# Patient Record
Sex: Female | Born: 1965 | Race: Black or African American | Hispanic: No
Health system: Midwestern US, Community
[De-identification: ages and names within clinical notes are randomized; demographics above are authoritative.]

## PROBLEM LIST (undated history)

## (undated) ENCOUNTER — Emergency Department (HOSPITAL_COMMUNITY): Admission: EM | Payer: Medicaid Other

## (undated) DIAGNOSIS — R9431 Abnormal electrocardiogram [ECG] [EKG]: Secondary | ICD-10-CM

## (undated) DIAGNOSIS — M25569 Pain in unspecified knee: Secondary | ICD-10-CM

## (undated) DIAGNOSIS — Z1231 Encounter for screening mammogram for malignant neoplasm of breast: Secondary | ICD-10-CM

## (undated) DIAGNOSIS — E781 Pure hyperglyceridemia: Secondary | ICD-10-CM

## (undated) DIAGNOSIS — R109 Unspecified abdominal pain: Secondary | ICD-10-CM

## (undated) DIAGNOSIS — M1990 Unspecified osteoarthritis, unspecified site: Secondary | ICD-10-CM

## (undated) DIAGNOSIS — K2100 Gastro-esophageal reflux disease with esophagitis, without bleeding: Secondary | ICD-10-CM

## (undated) DIAGNOSIS — E7800 Pure hypercholesterolemia, unspecified: Secondary | ICD-10-CM

## (undated) DIAGNOSIS — J40 Bronchitis, not specified as acute or chronic: Secondary | ICD-10-CM

## (undated) DIAGNOSIS — I441 Atrioventricular block, second degree: Secondary | ICD-10-CM

## (undated) DIAGNOSIS — K219 Gastro-esophageal reflux disease without esophagitis: Secondary | ICD-10-CM

## (undated) DIAGNOSIS — T7840XA Allergy, unspecified, initial encounter: Secondary | ICD-10-CM

## (undated) DIAGNOSIS — I2510 Atherosclerotic heart disease of native coronary artery without angina pectoris: Secondary | ICD-10-CM

## (undated) DIAGNOSIS — G43909 Migraine, unspecified, not intractable, without status migrainosus: Secondary | ICD-10-CM

## (undated) DIAGNOSIS — J42 Unspecified chronic bronchitis: Secondary | ICD-10-CM

## (undated) DIAGNOSIS — S43006A Unspecified dislocation of unspecified shoulder joint, initial encounter: Secondary | ICD-10-CM

## (undated) DIAGNOSIS — J45909 Unspecified asthma, uncomplicated: Secondary | ICD-10-CM

## (undated) HISTORY — PX: KNEE CARTILAGE SURGERY: SHX688

## (undated) HISTORY — DX: Allergy, unspecified, initial encounter: T78.40XA

## (undated) HISTORY — DX: Gastro-esophageal reflux disease without esophagitis: K21.9

## (undated) HISTORY — PX: ENDOMETRIAL ABLATION W/ NOVASURE: SUR434

## (undated) HISTORY — DX: Migraine, unspecified, not intractable, without status migrainosus: G43.909

## (undated) HISTORY — PX: UNILATERAL SALPINGECTOMY: SHX6160

## (undated) HISTORY — DX: Atrioventricular block, second degree: I44.1

---

## 1992-11-20 DIAGNOSIS — G43909 Migraine, unspecified, not intractable, without status migrainosus: Secondary | ICD-10-CM

## 1992-11-20 HISTORY — DX: Migraine, unspecified, not intractable, without status migrainosus: G43.909

## 1994-11-20 DIAGNOSIS — J45909 Unspecified asthma, uncomplicated: Secondary | ICD-10-CM

## 1994-11-20 HISTORY — DX: Unspecified asthma, uncomplicated: J45.909

## 1995-11-21 DIAGNOSIS — S43006A Unspecified dislocation of unspecified shoulder joint, initial encounter: Secondary | ICD-10-CM

## 1995-11-21 HISTORY — DX: Unspecified dislocation of unspecified shoulder joint, initial encounter: S43.006A

## 2006-11-20 HISTORY — PX: ENDOMETRIAL ABLATION: SHX621

## 2008-10-26 LAB — D-DIMER, QUANTITATIVE: D-Dimer, Quant: 0.28 ug/ml(FEU) (ref <0.46)

## 2008-10-26 LAB — D DIMER: D DIMER: 0.28 ug/ml(FEU) (ref <0.46)

## 2009-10-30 LAB — CBC WITH AUTOMATED DIFF
ABS. BASOPHILS: 0.1 10*3/uL (ref 0.0–0.1)
ABS. LYMPHOCYTES: 3.4 10*3/uL (ref 0.8–3.5)
ABS. MONOCYTES: 0.5 10*3/uL (ref 0–1.0)
ABS. NEUTROPHILS: 5 10*3/uL (ref 1.8–8.0)
BASOPHILS: 1 % (ref 0–3)
EOSINOPHILS: 0 % (ref 0–5)
HCT: 40.9 % (ref 36.0–46.0)
HGB: 13.9 g/dL (ref 12.0–16.0)
LYMPHOCYTES: 38 % (ref 20–51)
MCH: 31.8 PG (ref 25.0–35.0)
MCHC: 34 g/dL (ref 31.0–37.0)
MCV: 93.8 FL (ref 78.0–102.0)
MONOCYTES: 5 % (ref 2–9)
MPV: 9 FL (ref 7.4–10.4)
NEUTROPHILS: 56 % (ref 42–75)
PLATELET: 313 10*3/uL (ref 130–400)
RBC: 4.37 M/uL (ref 4.10–5.10)
RDW: 13 % (ref 11.5–14.5)
WBC: 9 10*3/uL (ref 4.5–13.0)

## 2009-10-30 LAB — METABOLIC PANEL, BASIC
Anion gap: 9 mmol/L (ref 5–15)
BUN/Creatinine ratio: 16 (ref 12–20)
BUN: 14 MG/DL (ref 7–18)
CO2: 27 MMOL/L (ref 21–32)
Calcium: 9.5 MG/DL (ref 8.4–10.4)
Chloride: 103 MMOL/L (ref 100–108)
Creatinine: 0.9 MG/DL (ref 0.6–1.3)
GFR est AA: >60 mL/min/{1.73_m2} (ref >60)
GFR est non-AA: >60 mL/min/{1.73_m2} (ref >60)
Glucose: 87 MG/DL (ref 74–99)
Potassium: 4.3 MMOL/L (ref 3.5–5.5)
Sodium: 139 MMOL/L (ref 130–146)

## 2009-10-30 LAB — MAGNESIUM: Magnesium: 1.8 MG/DL (ref 1.8–2.4)

## 2009-10-31 LAB — CKMB PROFILE
CK - MB: 0.7 ng/ml (ref 0.5–3.6)
CK-MB Index: 0.4 % (ref 0.0–4.0)
CK: 196 U/L (ref 21–215)

## 2009-10-31 LAB — TSH 3RD GENERATION: TSH: 1.78 u[IU]/mL (ref 0.51–6.27)

## 2009-10-31 LAB — TROPONIN I: Troponin-I, QT: <0.04 NG/ML (ref 0.00–0.08)

## 2009-11-05 LAB — D-DIMER, QUANTITATIVE: D-Dimer, Quant: <0.22 ug/ml(FEU) (ref <0.46)

## 2009-11-05 LAB — CBC WITH AUTOMATED DIFF
ABS. BASOPHILS: 0.1 10*3/uL (ref 0.0–0.1)
ABS. EOSINOPHILS: 0 10*3/uL (ref 0.0–0.4)
ABS. LYMPHOCYTES: 3.6 10*3/uL — ABNORMAL HIGH (ref 0.8–3.5)
ABS. MONOCYTES: 0.6 10*3/uL (ref 0–1.0)
ABS. NEUTROPHILS: 5.3 10*3/uL (ref 1.8–8.0)
BASOPHILS: 1 % (ref 0–3)
EOSINOPHILS: 0 % (ref 0–5)
HCT: 40.6 % (ref 36.0–46.0)
HGB: 13.7 g/dL (ref 12.0–16.0)
LYMPHOCYTES: 38 % (ref 20–51)
MCH: 31.1 PG (ref 25.0–35.0)
MCHC: 33.8 g/dL (ref 31.0–37.0)
MCV: 92 FL (ref 78.0–102.0)
MONOCYTES: 6 % (ref 2–9)
MPV: 8.4 FL (ref 7.4–10.4)
NEUTROPHILS: 55 % (ref 42–75)
PLATELET: 338 10*3/uL (ref 130–400)
RBC: 4.41 M/uL (ref 4.10–5.10)
RDW: 13.9 % (ref 11.5–14.5)
WBC: 9.6 10*3/uL (ref 4.5–13.0)

## 2009-11-05 LAB — METABOLIC PANEL, COMPREHENSIVE
A-G Ratio: 1.1 (ref 0.8–1.7)
ALT (SGPT): 30 U/L (ref 30–65)
AST (SGOT): 19 U/L (ref 15–37)
Albumin: 3.9 g/dL (ref 3.4–5.0)
Alk. phosphatase: 71 U/L (ref 50–136)
Anion gap: 9 mmol/L (ref 5–15)
BUN/Creatinine ratio: 14 (ref 12–20)
BUN: 11 MG/DL (ref 7–18)
Bilirubin, total: 0.4 MG/DL (ref 0.1–0.9)
CO2: 26 MMOL/L (ref 21–32)
Calcium: 9.5 MG/DL (ref 8.4–10.4)
Chloride: 105 MMOL/L (ref 100–108)
Creatinine: 0.8 MG/DL (ref 0.6–1.3)
GFR est AA: >60 mL/min/{1.73_m2} (ref >60)
GFR est non-AA: >60 mL/min/{1.73_m2} (ref >60)
Globulin: 3.7 g/dL (ref 2.0–4.0)
Glucose: 84 MG/DL (ref 74–99)
Potassium: 3.7 MMOL/L (ref 3.5–5.5)
Protein, total: 7.6 g/dL (ref 6.4–8.2)
Sodium: 140 MMOL/L (ref 136–145)

## 2009-11-05 LAB — D DIMER: D DIMER: <0.22 ug/ml(FEU) (ref <0.46)

## 2009-11-05 LAB — TROPONIN I: Troponin-I, QT: 0.04 NG/ML (ref 0.00–0.08)

## 2009-11-05 LAB — CKMB PROFILE
CK - MB: 0.6 ng/ml (ref 0.5–3.6)
CK-MB Index: 0.3 % (ref 0.0–4.0)
CK: 187 U/L (ref 21–215)

## 2009-11-06 LAB — CKMB PROFILE
CK - MB: 0.6 ng/ml (ref 0.5–3.6)
CK-MB Index: 0.4 % (ref 0.0–4.0)
CK: 171 U/L (ref 21–215)

## 2009-11-06 LAB — HCG URINE, QL: HCG urine, QL: NEGATIVE

## 2009-11-06 LAB — TROPONIN I: Troponin-I, QT: <0.04 NG/ML (ref 0.00–0.08)

## 2010-01-17 LAB — D-DIMER, QUANTITATIVE: D-Dimer, Quant: 0.22 ug/ml(FEU) (ref <0.46)

## 2010-01-17 LAB — D DIMER: D DIMER: 0.22 ug/ml(FEU) (ref <0.46)

## 2010-01-17 LAB — TROPONIN I: Troponin-I, QT: <0.04 NG/ML (ref 0.00–0.08)

## 2011-03-09 LAB — D-DIMER, QUANTITATIVE: D-Dimer, Quant: <0.22 ug/ml(FEU) (ref <0.46)

## 2011-03-09 LAB — URINALYSIS W/ RFLX MICROSCOPIC
Bilirubin: NEGATIVE
Blood: NEGATIVE
Glucose: NEGATIVE MG/DL
Ketone: NEGATIVE MG/DL
Leukocyte Esterase: NEGATIVE
Nitrites: NEGATIVE
Protein: NEGATIVE MG/DL
Specific gravity: 1.02 (ref 1.003–1.030)
Urobilinogen: 1 EU/DL (ref 0.2–1.0)
pH (UA): 7.5 (ref 5.0–8.0)

## 2011-03-09 LAB — METABOLIC PANEL, COMPREHENSIVE
A-G Ratio: 0.8 (ref 0.8–1.7)
ALT (SGPT): 28 U/L — ABNORMAL LOW (ref 30–65)
AST (SGOT): 14 U/L — ABNORMAL LOW (ref 15–37)
Albumin: 3.5 g/dL (ref 3.4–5.0)
Alk. phosphatase: 111 U/L (ref 50–136)
Anion gap: 7 mmol/L (ref 5–15)
BUN/Creatinine ratio: 16 (ref 12–20)
BUN: 13 MG/DL (ref 7–18)
Bilirubin, total: 0.2 MG/DL (ref 0.2–1.0)
CO2: 27 MMOL/L (ref 21–32)
Calcium: 8.8 MG/DL (ref 8.4–10.4)
Chloride: 105 MMOL/L (ref 100–108)
Creatinine: 0.8 MG/DL (ref 0.6–1.3)
GFR est AA: >60 mL/min/{1.73_m2} (ref >60)
GFR est non-AA: >60 mL/min/{1.73_m2} (ref >60)
Globulin: 4.2 g/dL — ABNORMAL HIGH (ref 2.0–4.0)
Glucose: 100 MG/DL — ABNORMAL HIGH (ref 74–99)
Potassium: 3.9 MMOL/L (ref 3.5–5.5)
Protein, total: 7.7 g/dL (ref 6.4–8.2)
Sodium: 139 MMOL/L (ref 136–145)

## 2011-03-09 LAB — CBC WITH AUTOMATED DIFF
ABS. BASOPHILS: 0 10*3/uL (ref 0.0–0.06)
ABS. EOSINOPHILS: 0 10*3/uL (ref 0.0–0.4)
ABS. LYMPHOCYTES: 3.1 10*3/uL (ref 0.9–3.6)
ABS. MONOCYTES: 0.6 10*3/uL (ref 0.05–1.2)
ABS. NEUTROPHILS: 4 10*3/uL (ref 1.8–8.0)
BASOPHILS: 0 % (ref 0–2)
EOSINOPHILS: 0 % (ref 0–5)
HCT: 41.1 % (ref 35.0–45.0)
HGB: 13.3 g/dL (ref 12.0–16.0)
LYMPHOCYTES: 40 % (ref 21–52)
MCH: 29.4 PG (ref 24.0–34.0)
MCHC: 32.4 g/dL (ref 31.0–37.0)
MCV: 90.7 FL (ref 74.0–97.0)
MONOCYTES: 7 % (ref 3–10)
MPV: 10.2 FL (ref 9.2–11.8)
NEUTROPHILS: 53 % (ref 40–73)
PLATELET: 325 10*3/uL (ref 135–420)
RBC: 4.53 M/uL (ref 4.20–5.30)
RDW: 13.7 % (ref 11.6–14.5)
WBC: 7.7 10*3/uL (ref 4.6–13.2)

## 2011-03-09 LAB — TROPONIN I: Troponin-I, QT: 0.05 NG/ML (ref 0.00–0.08)

## 2011-03-09 LAB — D DIMER: D DIMER: <0.22 ug/ml(FEU) (ref <0.46)

## 2011-03-09 LAB — CKMB PROFILE
CK - MB: 1.4 ng/ml (ref 0.5–3.6)
CK-MB Index: 0.7 % (ref 0.0–4.0)
CK: 190 U/L (ref 26–192)

## 2011-03-09 LAB — LIPASE: Lipase: 254 U/L (ref 73–393)

## 2011-03-09 LAB — HCG URINE, QL: HCG urine, QL: NEGATIVE

## 2011-03-10 LAB — CULTURE, URINE
Culture result:: <10000
Culture: <10000

## 2011-07-21 LAB — CBC WITH AUTOMATED DIFF
ABS. BASOPHILS: 0 10*3/uL (ref 0.0–0.06)
ABS. EOSINOPHILS: 0 10*3/uL (ref 0.0–0.4)
ABS. LYMPHOCYTES: 3.5 10*3/uL (ref 0.9–3.6)
ABS. MONOCYTES: 0.5 10*3/uL (ref 0.05–1.2)
ABS. NEUTROPHILS: 5.3 10*3/uL (ref 1.8–8.0)
BASOPHILS: 0 % (ref 0–2)
EOSINOPHILS: 0 % (ref 0–5)
HCT: 38.1 % (ref 35.0–45.0)
HGB: 12.9 g/dL (ref 12.0–16.0)
LYMPHOCYTES: 37 % (ref 21–52)
MCH: 29.9 PG (ref 24.0–34.0)
MCHC: 33.9 g/dL (ref 31.0–37.0)
MCV: 88.4 FL (ref 74.0–97.0)
MONOCYTES: 5 % (ref 3–10)
MPV: 9.6 FL (ref 9.2–11.8)
NEUTROPHILS: 58 % (ref 40–73)
PLATELET: 310 10*3/uL (ref 135–420)
RBC: 4.31 M/uL (ref 4.20–5.30)
RDW: 13.9 % (ref 11.6–14.5)
WBC: 9.3 10*3/uL (ref 4.6–13.2)

## 2011-07-21 LAB — METABOLIC PANEL, BASIC
Anion gap: 5 mmol/L (ref 5–15)
BUN/Creatinine ratio: 12 (ref 12–20)
BUN: 12 MG/DL (ref 7–18)
CO2: 26 MMOL/L (ref 21–32)
Calcium: 8.9 MG/DL (ref 8.4–10.4)
Chloride: 109 MMOL/L — ABNORMAL HIGH (ref 100–108)
Creatinine: 1 MG/DL (ref 0.6–1.3)
GFR est AA: >60 mL/min/{1.73_m2} (ref >60)
GFR est non-AA: >60 mL/min/{1.73_m2} (ref >60)
Glucose: 92 MG/DL (ref 74–99)
Potassium: 3.9 MMOL/L (ref 3.5–5.5)
Sodium: 140 MMOL/L (ref 136–145)

## 2011-07-21 LAB — PROTHROMBIN TIME + INR
INR: 0.9 (ref 0.0–1.2)
Prothrombin time: 12.2 s (ref 11.5–15.2)

## 2011-07-21 LAB — PTT: aPTT: 30.3 s (ref 24.6–37.7)

## 2011-07-21 MED ORDER — PROMETHAZINE 25 MG/ML INJECTION
25 mg/mL | INTRAMUSCULAR | Status: AC
Start: 2011-07-21 — End: 2011-07-21
  Administered 2011-07-21: 16:00:00 via INTRAVENOUS

## 2011-07-21 MED ORDER — PROMETHAZINE 12.5 MG TAB
12.5 mg | ORAL_TABLET | Freq: Four times a day (QID) | ORAL | Status: DC | PRN
Start: 2011-07-21 — End: 2011-12-18

## 2011-07-21 MED ORDER — NAPROXEN 500 MG TAB
500 mg | ORAL_TABLET | Freq: Two times a day (BID) | ORAL | Status: AC
Start: 2011-07-21 — End: 2011-07-31

## 2011-07-21 MED ORDER — ACETAMINOPHEN 325 MG TABLET
325 mg | ORAL | Status: AC
Start: 2011-07-21 — End: 2011-07-21
  Administered 2011-07-21: 16:00:00 via ORAL

## 2011-07-21 MED ADMIN — sodium chloride 0.9 % bolus infusion 1,000 mL: INTRAVENOUS | @ 16:00:00 | NDC 00409798309

## 2011-07-21 MED ADMIN — ketorolac (TORADOL) injection 30 mg: INTRAVENOUS | @ 17:00:00 | NDC 00409379501

## 2011-07-21 NOTE — ED Notes (Signed)
Pt, states  She had  Left sided headache, dizziness started  About  2 hours, ago, denies  Nausea/vomiting.

## 2011-07-21 NOTE — ED Provider Notes (Signed)
HPI Comments: 45 yo AAF w/ h/o Migraine HA's presents to ED c/o sharp left sided frontal HA that radiates to right forhead onset 1.5hrs PTA while standing around at work w/ associated dizziness.  She admits to working outside but says "it's not that hot".   She describes her dizziness as spinning that's better at rest.  She admits to taking Tylenol 250mg  PTA w/ no relief of Sx's.  Pt denies any focal weakness or N/V but admits to recent seasonal allergies and sinus congestion.  PCP is Dr. Kenna Gilbert.  No other aggravating or alleviating factors. No further complaints at this time.   11:23 AM        Patient is a 44 y.o. female presenting with migraine and dizziness. The history is provided by the patient.   Migraine   Associated symptoms include dizziness. Pertinent negatives include no fever, no shortness of breath, no weakness, no nausea and no vomiting.   Dizziness   Associated symptoms include congestion (sinus'), headaches and dizziness. Pertinent negatives include no chest pain, no fever, no abdominal pain, no nausea, no vomiting and no weakness.        Past Medical History   Diagnosis Date   ??? Migraine         Past Surgical History   Procedure Date   ??? Hx gyn    ??? Hx tubal ligation          No family history on file.     History     Social History   ??? Marital Status: Single     Spouse Name: N/A     Number of Children: N/A   ??? Years of Education: N/A     Occupational History   ??? Not on file.     Social History Main Topics   ??? Smoking status: Never Smoker    ??? Smokeless tobacco: Not on file   ??? Alcohol Use: Yes      rarely   ??? Drug Use: No   ??? Sexually Active:      Other Topics Concern   ??? Not on file     Social History Narrative   ??? No narrative on file                  ALLERGIES: Review of patient's allergies indicates no known allergies.      Review of Systems   Constitutional: Negative.  Negative for fever and chills.   HENT: Positive for congestion (sinus') and ear discharge (chronic).     Eyes: Negative.    Respiratory: Negative.  Negative for shortness of breath.    Cardiovascular: Negative.  Negative for chest pain.   Gastrointestinal: Negative.  Negative for nausea, vomiting and abdominal pain.   Genitourinary: Negative.    Musculoskeletal: Negative.    Skin: Negative.    Neurological: Positive for dizziness and headaches. Negative for weakness.   Psychiatric/Behavioral: Negative.    All other systems reviewed and are negative.        Filed Vitals:    07/21/11 1052 07/21/11 1105   BP: 134/85    Pulse: 78    Temp: 97.6 ??F (36.4 ??C)    Resp: 14    Height: 5\' 2"  (1.575 m)    Weight: 87.091 kg (192 lb)    SpO2: 98% 100%            Physical Exam   Nursing note and vitals reviewed.  Constitutional: She is oriented to person, place, and time.  She appears well-developed and well-nourished. No distress.   HENT:   Head: Normocephalic and atraumatic.   Right Ear: External ear normal.   Nose: Nose normal.   Mouth/Throat: Oropharynx is clear and moist.        Mild pain to percussion of the L frontal scalp, L TM poorly visualized due to cerumen     Eyes: Conjunctivae and EOM are normal. Pupils are equal, round, and reactive to light. No scleral icterus.   Neck: Normal range of motion. Neck supple. No JVD present. No tracheal deviation present. No thyromegaly present.   Cardiovascular: Normal rate, regular rhythm, normal heart sounds and intact distal pulses.  Exam reveals no gallop and no friction rub.    No murmur heard.  Pulmonary/Chest: Effort normal and breath sounds normal. She exhibits no tenderness.   Abdominal: Soft. Bowel sounds are normal. She exhibits no distension. There is no tenderness. There is no rebound and no guarding.   Musculoskeletal: Normal range of motion. She exhibits no edema and no tenderness.   Lymphadenopathy:     She has no cervical adenopathy.    Neurological: She is alert and oriented to person, place, and time. She has normal reflexes. No cranial nerve deficit. Coordination normal.        No sensory loss, Gait normal, Motor 5/5, no arm or leg drift   Skin: Skin is warm and dry.   Psychiatric: She has a normal mood and affect. Her behavior is normal. Judgment and thought content normal.        MDM     Differential Diagnosis; Clinical Impression; Plan:     Pt is a 45yo female with a h/o migraine HA presents with complaint of L sided HA that occurred at work.  Pt has mild disequilibrium, sinus pressure.  Pt denies weakness and the onset was 1.5 hours PTA.  Pt is nonfocal at this time.  Will CT head as SAH remains in the differential but sinus disease vs migraine HA likely.  Will treat with phenergan, IVF, and reevaluate.       Procedures  Patients labs and imaging reviewed and reassuring.  Pt pain has improved.  Likelihood of SAH is low given her presentation within 6 hrs of onset.  Will proceed with close out patient supportive care.  Pt educated to return if at all worsened or concerned.

## 2011-07-21 NOTE — ED Notes (Signed)
Patient c/o sharp headache pains to left side of head that radiate across frontal part of head.  Patient reports she suffers from migraines but states "this don't feel like my normal headache" and reports this feels like the worst headache she has ever felt.  Patient also reports dizziness with headache.

## 2011-07-21 NOTE — ED Notes (Signed)
Patient complains of having a headache with some dizziness. She admits to suffering from migraines, but this is not a migraine. She states she has had sinus problems for the past two days. Denies any weakness in her extremities.

## 2011-07-21 NOTE — ED Notes (Signed)
Patient armband removed and shredded. Patient given discharge instructions, verbally acknowledged understanding of all discharge instructions. Patient ambulated out of ED with no signs of distress noted.

## 2011-08-23 NOTE — ED Notes (Signed)
Pt did not respond to x3 attempt calls

## 2011-10-30 ENCOUNTER — Encounter

## 2011-12-18 MED ORDER — HYDROCODONE-ACETAMINOPHEN 5 MG-325 MG TAB
5-325 mg | ORAL_TABLET | ORAL | Status: DC | PRN
Start: 2011-12-18 — End: 2012-02-14

## 2011-12-18 MED ORDER — IBUPROFEN 600 MG TAB
600 mg | ORAL_TABLET | Freq: Four times a day (QID) | ORAL | Status: DC | PRN
Start: 2011-12-18 — End: 2012-05-31

## 2011-12-18 MED ORDER — KETOROLAC TROMETHAMINE 60 MG/2 ML IM
60 mg/2 mL | INTRAMUSCULAR | Status: AC
Start: 2011-12-18 — End: 2011-12-18
  Administered 2011-12-18: 12:00:00 via INTRAMUSCULAR

## 2011-12-18 MED ORDER — CYCLOBENZAPRINE 10 MG TAB
10 mg | ORAL_TABLET | Freq: Three times a day (TID) | ORAL | Status: DC | PRN
Start: 2011-12-18 — End: 2012-02-14

## 2011-12-18 NOTE — ED Provider Notes (Signed)
Patient is a 46 y.o. female presenting with back pain.   Back Pain        46 yo female to ER with c/o Intermittant lower back pain x 3 days, pt also c/o migraine HA x 2 days, pt denies any fever, N/V/D, CP, SOB, dizziness, blurry vision, ABD pain, lightheadedness, weakness, paresthesias, recent injury/surgery/trauma, urinary s/s, vaginal complaints or any other concerns, pt has taken no meds PTA, pt drove self to ER, pt reports that her pain is exacerbated with movement, improved with rest. Pt reports hx LBP and migraine HA, pt reports that her HA is same as typical migraine, Not worst of life, Not sudden onset, not quick to maximal intensity, PCP Dr Kenna Gilbert.     Past Medical History   Diagnosis Date   ??? Migraine    ??? GERD (gastroesophageal reflux disease)         Past Surgical History   Procedure Date   ??? Hx gyn    ??? Hx tubal ligation          No family history on file.     History     Social History   ??? Marital Status: SINGLE     Spouse Name: N/A     Number of Children: N/A   ??? Years of Education: N/A     Occupational History   ??? Not on file.     Social History Main Topics   ??? Smoking status: Never Smoker    ??? Smokeless tobacco: Not on file   ??? Alcohol Use: Yes      rarely   ??? Drug Use: No   ??? Sexually Active:      Other Topics Concern   ??? Not on file     Social History Narrative   ??? No narrative on file                  ALLERGIES: Review of patient's allergies indicates no known allergies.      Review of Systems   Musculoskeletal: Positive for back pain.     ROS   Constitutional:?? Denies malaise, fever, chills.   Head:?? Denies injury.   Face:?? Denies injury or pain.   ENMT:?? Denies sore throat.   Neck:?? Denies injury or pain.   Chest:?? Denies injury.   Cardiac:?? Denies chest pain or palpitations.   Respiratory:?? Denies cough, wheezing, difficulty breathing, shortness of breath.   GI/ABD:?? Denies injury, pain, distention, nausea, vomiting, diarrhea.   GU:?? Denies injury, pain, dysuria or urgency.   Back:?? see  HPI  Pelvis:?? Denies injury or pain.   Extremity/MS:?? Denies injury or pain.   Neuro:?? see HPI  Skin: Denies injury, rash, itching or skin changes.       Filed Vitals:    12/18/11 0604   BP: 125/80   Pulse: 69   Temp: 97.2 ??F (36.2 ??C)   Resp: 18   Height: 5\' 2"  (1.575 m)   Weight: 90.719 kg (200 lb)   SpO2: 96%            Physical Exam   Nursing note and vitals reviewed.  Constitutional: She is oriented to person, place, and time. She appears well-developed and well-nourished.   HENT:   Head: Normocephalic and atraumatic.   Right Ear: External ear normal.   Left Ear: External ear normal.   Nose: Nose normal.   Mouth/Throat: Oropharynx is clear and moist.   Eyes: Conjunctivae are normal. Pupils are equal, round, and reactive  to light.   Neck: Normal range of motion. Neck supple.        FROM in neck with no midline spinal or paraspinal muscle tenderness, no swelling, bruising, ecchymosis, deformity or step off noted. nv intact   Cardiovascular: Normal rate, regular rhythm and normal heart sounds.    Pulmonary/Chest: Effort normal and breath sounds normal.   Abdominal: Soft. Bowel sounds are normal. There is no tenderness. There is no rebound and no guarding.        Positive BS noted, ABD soft with no tenderness, no rebound, guarding or rigidity noted   Musculoskeletal:        Back: FROM in back with no midline spinal tenderness, +Bilateral lumbar paraspinal muscle tenderness, no swelling, bruising, ecchymosis, deformity or step off noted,nv intact    Ext: FROM in BUE and BLE with no tenderness, swelling, bruising, ecchymosis, deformity, dislocation or open wound noted, motor 5/5, Neg SLR bilaterally.    Neurological: She is alert and oriented to person, place, and time.   Skin: Skin is warm and dry.   Psychiatric: She has a normal mood and affect. Her behavior is normal.        MDM     Differential Diagnosis; Clinical Impression; Plan:     DDX: Sciatica, Lumbar radiculopathy, Back Sprain, Back Strain, DJD, HNP,  Epidural abscess, Cauda Equina Syndrome, Contusion, Pyelonephritis, Renal Colic, Rib Fracture, Rib Contusion, Costochondritis, Pleurisy, Pleurodynia, Pneumothorax, Thoracic Aneurysm, Abdominal Aortic Aneurysm, Nephro-ureteral Calculus, Acute Myocardial Infarction.     DDx: HA, migraine, dizziness, tension HA, meningitis, contusion, CHI, fracture hematoma    Plan: pt with stable vitals, c/o lower back pain x 3 days after heavy lifting, migraine HA x 2 days, physical exam with bilateral lumbar paraspinal muscle tenderness, Patient has typical migraine, not worst ever, not sudden onset, no short interval to maximal severity/neuro deficit/AMS/meningismus. CT head or LP not emergently indicated.  Will treat symptoms and reassess. Pt drove self to ER, will give dose of toradol and plan to discharge with flexeril, motrin, vicodin and ortho f/u          Amount and/or Complexity of Data Reviewed:   Discussion of test results with the performing providers:  No   Decide to obtain previous medical records or to obtain history from someone other than the patient:  Yes   Obtain history from someone other than the patient:  No   Review and summarize past medical records:  Yes   Discuss the patient with another provider:  No   Independant visualization of image, tracing, or specimen:  No  Risk of Significant Complications, Morbidity, and/or Mortality:   Presenting problems:  Low  Diagnostic procedures:  Low  Management options:  Low  Progress:   Patient progress:  Stable      Procedures      Progress Note    Patient is improved, resting quietly and comfortably, Reviewed all workup results, including any lab or radiology results, meds given and/or prescribed, and discharge instructions with patient and any family present. Patient and any family present advised to return here immediately at any time for recurrent symptoms, new symptoms, any concerns, or if unable to obtain follow-up as directed. Patient expresses understanding and  agrees to proceed with discharge plan. Darshana Curnutt, PA-C 6:54 AM

## 2011-12-18 NOTE — ED Provider Notes (Signed)
I was personally available for consultation in the emergency department.  I have reviewed the chart and agree with the documentation recorded by the MLP, including the assessment, treatment plan, and disposition.  Jermond Burkemper T Brevan Luberto, MD

## 2011-12-18 NOTE — ED Notes (Signed)
I have reviewed discharge instructions with the patient.  The patient verbalized understanding. Armband shredded, pt left ED in stable condition

## 2011-12-18 NOTE — ED Notes (Signed)
Patient complaining of back pain for last three days.

## 2011-12-18 NOTE — ED Notes (Addendum)
Assumed pt care, pt resting in bed in stable condition. Call bell within reach, bed in lowest position. Will continue to monitor

## 2011-12-18 NOTE — ED Notes (Signed)
Pt ambulated to the bathroom, tolerated well gait steady

## 2012-01-17 LAB — HEPATIC FUNCTION PANEL
A-G Ratio: 1 (ref 0.8–1.7)
ALT (SGPT): 18 U/L (ref 12.0–78.0)
AST (SGOT): 18 U/L (ref 15–37)
Albumin: 3.5 g/dL (ref 3.4–5.0)
Alk. phosphatase: 83 U/L (ref 50–136)
Bilirubin, direct: <0.1 MG/DL (ref 0.0–0.2)
Bilirubin, total: 0.2 MG/DL (ref 0.2–1.0)
Globulin: 3.6 g/dL (ref 2.0–4.0)
Protein, total: 7.1 g/dL (ref 6.4–8.2)

## 2012-01-17 LAB — URINALYSIS W/ RFLX MICROSCOPIC
Bilirubin: NEGATIVE
Blood: NEGATIVE
Glucose: NEGATIVE MG/DL
Ketone: NEGATIVE MG/DL
Leukocyte Esterase: NEGATIVE
Nitrites: NEGATIVE
Protein: NEGATIVE MG/DL
Specific gravity: 1.015 (ref 1.003–1.030)
Urobilinogen: 0.2 EU/DL (ref 0.2–1.0)
pH (UA): 8 (ref 5.0–8.0)

## 2012-01-17 LAB — CBC WITH AUTOMATED DIFF
ABS. BASOPHILS: 0 10*3/uL (ref 0.0–0.06)
ABS. EOSINOPHILS: 0 10*3/uL (ref 0.0–0.4)
ABS. LYMPHOCYTES: 3 10*3/uL (ref 0.9–3.6)
ABS. MONOCYTES: 0.4 10*3/uL (ref 0.05–1.2)
ABS. NEUTROPHILS: 2.7 10*3/uL (ref 1.8–8.0)
BASOPHILS: 0 % (ref 0–2)
EOSINOPHILS: 0 % (ref 0–5)
HCT: 40.8 % (ref 35.0–45.0)
HGB: 13.7 g/dL (ref 12.0–16.0)
LYMPHOCYTES: 49 % (ref 21–52)
MCH: 30.2 PG (ref 24.0–34.0)
MCHC: 33.6 g/dL (ref 31.0–37.0)
MCV: 89.9 FL (ref 74.0–97.0)
MONOCYTES: 7 % (ref 3–10)
MPV: 10.5 FL (ref 9.2–11.8)
NEUTROPHILS: 44 % (ref 40–73)
PLATELET: 280 10*3/uL (ref 135–420)
RBC: 4.54 M/uL (ref 4.20–5.30)
RDW: 13.5 % (ref 11.6–14.5)
WBC: 6.1 10*3/uL (ref 4.6–13.2)

## 2012-01-17 LAB — METABOLIC PANEL, BASIC
Anion gap: 9 mmol/L (ref 3.0–18)
BUN/Creatinine ratio: 12 (ref 12–20)
BUN: 9 MG/DL (ref 7.0–18)
CO2: 25 MMOL/L (ref 21–32)
Calcium: 8.7 MG/DL (ref 8.5–10.1)
Chloride: 108 MMOL/L (ref 100–108)
Creatinine: 0.77 MG/DL (ref 0.6–1.3)
GFR est AA: >60 mL/min/{1.73_m2} (ref >60)
GFR est non-AA: >60 mL/min/{1.73_m2} (ref >60)
Glucose: 87 MG/DL (ref 74–99)
Potassium: 4 MMOL/L (ref 3.5–5.5)
Sodium: 142 MMOL/L (ref 136–145)

## 2012-01-17 LAB — HCG URINE, QL: HCG urine, QL: NEGATIVE

## 2012-01-17 LAB — LIPASE: Lipase: 153 U/L (ref 73–393)

## 2012-01-17 MED ORDER — ONDANSETRON 8 MG TAB, RAPID DISSOLVE
8 mg | ORAL_TABLET | Freq: Three times a day (TID) | ORAL | Status: DC | PRN
Start: 2012-01-17 — End: 2012-05-31

## 2012-01-17 MED ORDER — ONDANSETRON (PF) 4 MG/2 ML INJECTION
4 mg/2 mL | INTRAMUSCULAR | Status: AC
Start: 2012-01-17 — End: 2012-01-17
  Administered 2012-01-17: 16:00:00 via INTRAVENOUS

## 2012-01-17 MED ADMIN — sodium chloride 0.9 % bolus infusion 1,000 mL: INTRAVENOUS | @ 16:00:00 | NDC 00409798309

## 2012-01-17 MED FILL — ONDANSETRON HCL 4 MG/2 ML INTRAVENOUS SYRINGE: 4 mg/2 mL | INTRAVENOUS | Qty: 2

## 2012-01-17 MED FILL — SODIUM CHLORIDE 0.9 % IV: INTRAVENOUS | Qty: 1000

## 2012-01-17 NOTE — ED Notes (Signed)
I have reviewed discharge instructions with the patient.  The patient verbalized understanding.Patient armband removed and given to patient to take home.  Patient was informed of the privacy risks if armband lost or stolen

## 2012-01-17 NOTE — ED Notes (Signed)
"  Ive been vomiting for the last 2 days"  Per patient  Also c/o low back pain.  reports vomiting x 2 today, tongue moist

## 2012-01-17 NOTE — ED Provider Notes (Signed)
HPI Comments: Kimberly Lopez is a 46 y.o. female  Patient presents with Nausea and vomiting.  Onset: Sunday morning (4 days ago), after a night of heavy alcohol use on Saturday.  Course: Intermittent, improving, ate dinner yesterday, no N/V today, but no PO intake today.   Radiation: None.  Exacerbated by: Eating.  Alleviated by: Nothing.  Associated with: generalized aches, bilateral lower back pain, fever 101F on Monday, treated with cold ginger ale. Not associated with: chills, HA, CP, SOB, abdominal pain, urinary symptoms, vaginal bleeding, vaginal discharge, urinary symptoms, last period was 4-5 years ago.  Patient requests work note because her boss is "tripping."      Patient is a 46 y.o. female presenting with vomiting.   Vomiting   Associated symptoms include a fever. Pertinent negatives include no chills, no abdominal pain, no diarrhea, no headaches, no cough and no headaches.        Past Medical History   Diagnosis Date   ??? Migraine    ??? GERD (gastroesophageal reflux disease)    ??? Respiratory abnormalities      bronchitis   ??? Gastrointestinal disorder      acid reflux   ??? HX OTHER MEDICAL      migraines        Past Surgical History   Procedure Date   ??? Hx gyn    ??? Hx tubal ligation          No family history on file.     History     Social History   ??? Marital Status: SINGLE     Spouse Name: N/A     Number of Children: N/A   ??? Years of Education: N/A     Occupational History   ??? Not on file.     Social History Main Topics   ??? Smoking status: Never Smoker    ??? Smokeless tobacco: Not on file   ??? Alcohol Use: Yes      rarely   ??? Drug Use: No   ??? Sexually Active:      Other Topics Concern   ??? Not on file     Social History Narrative   ??? No narrative on file                  ALLERGIES: Review of patient's allergies indicates no known allergies.      Review of Systems   Constitutional: Positive for fever. Negative for chills.   HENT: Negative for ear pain, congestion, sore throat, rhinorrhea, neck pain and  neck stiffness.    Respiratory: Negative for cough and shortness of breath.    Cardiovascular: Negative for chest pain and leg swelling.   Gastrointestinal: Positive for nausea and vomiting. Negative for abdominal pain, diarrhea and blood in stool.   Genitourinary: Negative for dysuria, hematuria and flank pain.   Musculoskeletal: Negative for back pain and joint swelling.   Skin: Negative for rash.   Neurological: Negative for seizures, syncope, facial asymmetry, weakness, numbness and headaches.   Psychiatric/Behavioral: Negative for suicidal ideas and dysphoric mood.       Filed Vitals:    01/17/12 0950   BP: 149/86   Pulse: 76   Temp: 96.4 ??F (35.8 ??C)   Resp: 18   Height: 5\' 2"  (1.575 m)   Weight: 89.359 kg (197 lb)   SpO2: 98%            Physical Exam   Constitutional: She is oriented to person, place,  and time. She appears well-developed and well-nourished. She is cooperative.   HENT:   Head: Normocephalic and atraumatic.   Nose: Nose normal.   Mouth/Throat: Oropharynx is clear and moist.   Eyes: Conjunctivae and EOM are normal. Pupils are equal, round, and reactive to light.   Neck: Normal range of motion and full passive range of motion without pain. Neck supple. No JVD present. No tracheal tenderness and no spinous process tenderness present. No tracheal deviation present.   Cardiovascular: Normal rate, regular rhythm, S1 normal, S2 normal, normal heart sounds and intact distal pulses.  Exam reveals no gallop and no friction rub.    No murmur heard.  Pulmonary/Chest: Effort normal and breath sounds normal. No accessory muscle usage or stridor. No respiratory distress. She has no wheezes. She has no rales. She exhibits no tenderness.   Abdominal: Soft. Bowel sounds are normal. She exhibits no distension, no abdominal bruit, no pulsatile midline mass and no mass. There is no tenderness. There is no rebound, no guarding and no CVA tenderness.   Musculoskeletal: Normal range of motion. She exhibits no edema  and no tenderness.        There are no acute deformities noted in all four extremities.  There is full ROM in all extremity joints.  There is no bony or joint tenderness to palpation.  Pulses are equal bilaterally. No calf tenderness, erythema, or size asymmetry   Lymphadenopathy:     She has no cervical adenopathy.   Neurological: She is alert and oriented to person, place, and time. She has normal strength. No cranial nerve deficit or sensory deficit. Coordination and gait normal.   Skin: Skin is warm and dry. No rash noted. No erythema.   Psychiatric: She has a normal mood and affect. Her behavior is normal. Judgment and thought content normal.        MDM    The patient presents with N/V with a differential diagnosis including but not limited to: ACS, AAA, abdominal pain, appendicitis, biliary colic, cholecystitis, diverticulitis, gastritis, gastroenteritis, GERD, bowel obstruction, bowel perforation, pancreatitis, PUD, renal colic, UTI, vomiting, ectopic pregnancy, ovarian cyst, ovarian torsion, PID, pregnancy, vaginal bleeding.  10:41 AM    Patient presents with persistent N/V since heavy drinking, vital signs and exam are reassuring.  Will treat symptoms, check labs, conduct serial abdominal exams.   10:41 AM  Dannette Barbara, MD    Workup reassuring.  In no distress, wants to go home. Abdomen remains benign. Reviewed all workup results, including any lab or radiology results, meds given and/or prescribed, and discharge instructions with patient and any family present. Patient and any family present advised to return here immediately at any time for recurrent symptoms, new symptoms, any concerns, or if unable to obtain follow-up as directed. Patient expresses understanding and agrees to proceed with discharge plan.   Dannette Barbara, MD 12:12 PM    Procedures    Recent Results (from the past 8 hour(s))   URINALYSIS W/ RFLX MICROSCOPIC    Collection Time    01/17/12  9:50 AM       Component Value Range     Color YELLOW      Appearance CLEAR      Specific gravity 1.015  1.003 - 1.030      pH 8.0  5.0 - 8.0      Protein NEGATIVE   NEGATIVE MG/DL    Glucose NEGATIVE   NEGATIVE MG/DL    Ketone NEGATIVE   NEGATIVE  MG/DL    Bilirubin NEGATIVE   NEGATIVE    Blood NEGATIVE   NEGATIVE    Urobilinogen 0.2  0.2 - 1.0 EU/DL    Nitrites NEGATIVE   NEGATIVE    Leukocyte Esterase NEGATIVE   NEGATIVE   HCG URINE, QL    Collection Time    01/17/12  9:50 AM       Component Value Range    HCG urine, Ql. NEGATIVE   NEGATIVE   METABOLIC PANEL, BASIC    Collection Time    01/17/12 10:51 AM       Component Value Range    Sodium 142  136 - 145 MMOL/L    Potassium 4.0  3.5 - 5.5 MMOL/L    Chloride 108  100 - 108 MMOL/L    CO2 25  21 - 32 MMOL/L    Anion gap 9  3.0 - 18 mmol/L    Glucose 87  74 - 99 MG/DL    BUN 9  7.0 - 18 MG/DL    Creatinine 4.54  0.6 - 1.3 MG/DL    BUN/Creatinine ratio 12  12 - 20      GFR est AA >60  >60 ml/min/1.47m2    GFR est non-AA >60  >60 ml/min/1.96m2    Calcium 8.7  8.5 - 10.1 MG/DL   CBC WITH AUTOMATED DIFF    Collection Time    01/17/12 10:51 AM       Component Value Range    WBC 6.1  4.6 - 13.2 K/uL    RBC 4.54  4.20 - 5.30 M/uL    HGB 13.7  12.0 - 16.0 g/dL    HCT 09.8  11.9 - 14.7 %    MCV 89.9  74.0 - 97.0 FL    MCH 30.2  24.0 - 34.0 PG    MCHC 33.6  31.0 - 37.0 g/dL    RDW 82.9  56.2 - 13.0 %    PLATELET 280  135 - 420 K/uL    MPV 10.5  9.2 - 11.8 FL    NEUTROPHILS 44  40 - 73 %    LYMPHOCYTES 49  21 - 52 %    MONOCYTES 7  3 - 10 %    EOSINOPHILS 0  0 - 5 %    BASOPHILS 0  0 - 2 %    ABS. NEUTROPHILS 2.7  1.8 - 8.0 K/UL    ABS. LYMPHOCYTES 3.0  0.9 - 3.6 K/UL    ABS. MONOCYTES 0.4  0.05 - 1.2 K/UL    ABS. EOSINOPHILS 0.0  0.0 - 0.4 K/UL    ABS. BASOPHILS 0.0  0.0 - 0.06 K/UL    DF AUTOMATED     HEPATIC FUNCTION PANEL    Collection Time    01/17/12 10:51 AM       Component Value Range    Protein, total 7.1  6.4 - 8.2 g/dL    Albumin 3.5  3.4 - 5.0 g/dL    Globulin 3.6  2.0 - 4.0 g/dL    A-G Ratio 1.0  0.8 - 1.7       Bilirubin, total 0.2  0.2 - 1.0 MG/DL    Bilirubin, direct <8.6  0.0 - 0.2 MG/DL    Alk. phosphatase 83  50 - 136 U/L    AST 18  15 - 37 U/L    ALT 18  12.0 - 78.0 U/L   LIPASE    Collection Time    01/17/12  10:51 AM       Component Value Range    Lipase 153  73 - 393 U/L

## 2012-01-22 ENCOUNTER — Encounter

## 2012-02-14 MED ORDER — PSEUDOEPHEDRINE 30 MG TAB
30 mg | ORAL_TABLET | Freq: Four times a day (QID) | ORAL | Status: DC | PRN
Start: 2012-02-14 — End: 2012-05-31

## 2012-02-14 MED ORDER — DEXAMETHASONE SODIUM PHOSPHATE 4 MG/ML IJ SOLN
4 mg/mL | INTRAMUSCULAR | Status: AC
Start: 2012-02-14 — End: 2012-02-14
  Administered 2012-02-14: 13:00:00 via ORAL

## 2012-02-14 MED ORDER — HYDROCODONE 10 MG-CHLORPHENIRAMINE 8 MG/5 ML ORAL SUSP EXTEND.REL 12HR
10-8 mg/5 mL | Freq: Two times a day (BID) | ORAL | Status: DC | PRN
Start: 2012-02-14 — End: 2012-05-31

## 2012-02-14 MED FILL — DEXAMETHASONE SODIUM PHOSPHATE 4 MG/ML IJ SOLN: 4 mg/mL | INTRAMUSCULAR | Qty: 3

## 2012-02-14 NOTE — ED Notes (Signed)
"  I have had a sore throat, cough, and my sinus hurts for three days."

## 2012-02-14 NOTE — ED Provider Notes (Signed)
HPI Comments: ICELA GLYMPH is a 46 y.o. female who presents with runny nose, congestion, sinus pressure and cough x 3 days.  Reports chills, no fevers.  Multiple sick contacts at work.  Has used Tylenol and Alka Seltzer with no relief. Denies history of HTN.     Patient is a 46 y.o. female presenting with cough, sore throat, and sinus pain.   Cough  Associated symptoms include rhinorrhea and sore throat. Pertinent negatives include no chest pain, no chills, no ear pain, no headaches, no shortness of breath, no nausea and no vomiting.   Sore Throat   Associated symptoms include congestion and cough. Pertinent negatives include no diarrhea, no vomiting, no ear pain, no headaches and no shortness of breath.   Sinus Pain   Associated symptoms include congestion, sore throat, cough and rhinorrhea. Pertinent negatives include no chills, no ear pain, no shortness of breath, no neck pain, no neck pain, no headaches and no chest pain.        Past Medical History   Diagnosis Date   ??? Migraine    ??? GERD (gastroesophageal reflux disease)    ??? Respiratory abnormalities      bronchitis   ??? Gastrointestinal disorder      acid reflux   ??? HX OTHER MEDICAL      migraines        Past Surgical History   Procedure Date   ??? Hx gyn    ??? Hx tubal ligation          No family history on file.     History     Social History   ??? Marital Status: SINGLE     Spouse Name: N/A     Number of Children: N/A   ??? Years of Education: N/A     Occupational History   ??? Not on file.     Social History Main Topics   ??? Smoking status: Never Smoker    ??? Smokeless tobacco: Not on file   ??? Alcohol Use: Yes      rarely   ??? Drug Use: No   ??? Sexually Active:      Other Topics Concern   ??? Not on file     Social History Narrative   ??? No narrative on file                  ALLERGIES: Review of patient's allergies indicates no known allergies.      Review of Systems   Constitutional: Negative for fever and chills.   HENT: Positive for congestion, sore throat and  rhinorrhea. Negative for ear pain, neck pain and neck stiffness.    Respiratory: Positive for cough. Negative for shortness of breath.    Cardiovascular: Negative for chest pain and leg swelling.   Gastrointestinal: Negative for nausea, vomiting, abdominal pain, diarrhea and blood in stool.   Genitourinary: Negative for dysuria, hematuria and flank pain.   Musculoskeletal: Negative for back pain and joint swelling.   Skin: Negative for rash.   Neurological: Negative for seizures, syncope, facial asymmetry, weakness, numbness and headaches.   Psychiatric/Behavioral: Negative for suicidal ideas and dysphoric mood.       Filed Vitals:    02/14/12 0812   BP: 140/92   Pulse: 85   Temp: 97.1 ??F (36.2 ??C)   Resp: 16   Height: 5\' 2"  (1.575 m)   Weight: 89.812 kg (198 lb)   SpO2: 98%  Physical Exam   Constitutional:        In no distress   HENT:        Normocephalic, atraumatic.  Mucus membranes moist. Nasal congestion, clear rhinorrhea.  Bilateral maxillary and frontal sinus tenderness.  No tonsillar adenopathy, no exudate.  Posterior cobblestoning present.    Eyes:        Pupils normal, reactive.  EOMI.  Conjunctivae normal.    Neck:        ROM normal.  No midline tenderness.  No stridor.  No JVD.   Cardiovascular: S2 normal.         Regular rate.  Normal S1/S2.  No abnormal heart sounds. Distal pulses normal.    Pulmonary/Chest:        Normal respiratory effort.  Normal breath sounds.   Abdominal: She exhibits no abdominal bruit and no pulsatile midline mass. There is no CVA tenderness.        Normal bowel sounds.  Soft.  Non-tender to palpation. No guarding.   Musculoskeletal:        There are no acute deformities noted in all four extremities.  There is full ROM in all extremity joints.  There is no bony or joint tenderness to palpation.  Pulses are equal bilaterally. No calf tenderness, erythema, or size asymmetry   Neurological:        A&O x 3.  No cranial nerve deficit.  Normal extremity strength and  sensation x 4.   Skin:        Normal with no rash.   Psychiatric:        Normal mood and affect.         MDM    The patient presents with sore throat with a differential diagnosis including but not limited to: URI, viral pharyngitis, bacterial pharyngitis, peritonsillar abscess, retropharyngeal abscess, bacterial tracheitis, angioedema, Ludwig's angina.  8:54 AM    Patient presents with URI symptoms/cough, exam and vitals reassuring.  Will treat with Sudafed and cough medicine.  Patient advised of likely viral cause, no need for abx.   8:54 AM  Dannette Barbara, MD    Reviewed all workup results, including any lab or radiology results, meds given and/or prescribed, and discharge instructions with patient and any family present. Patient and any family present advised to return here immediately at any time for recurrent symptoms, new symptoms, any concerns, or if unable to obtain follow-up as directed. Patient expresses understanding and agrees to proceed with discharge plan.   Dannette Barbara, MD 8:54 AM      Procedures

## 2012-05-31 NOTE — ED Notes (Signed)
Pt discharged,  Discharge instructions reviewed with pt including prescriptions,.  Pt voiced understanding

## 2012-05-31 NOTE — ED Provider Notes (Signed)
HPI Comments: Kimberly Lopez is a  46 y.o. Overweight african [place] female presents to the ED via private vehicle c/o back pain, primarily lower non-radiating, dull aching, worse with lifting, pushing, pulling x 5 days. She says she was moving 5 days ago when she bent over and felt a pop. Denies fever, chills, abdominal pain, n/v/d, brbpr, melena, flank pain, blood in urine, loss of b/b control, saddle anesthesia, pain w/ urination, weakness, numbness/tingling. Works in the [employer], went to work today.       Patient is a 46 y.o. female presenting with back pain.   Back Pain   Pertinent negatives include no chest pain, no fever, no headaches, no abdominal pain and no dysuria.        Past Medical History   Diagnosis Date   ??? Migraine    ??? GERD (gastroesophageal reflux disease)    ??? Respiratory abnormalities      bronchitis   ??? Gastrointestinal disorder      acid reflux   ??? HX OTHER MEDICAL      migraines   ??? Chest pain, unspecified    ??? Shortness of breath    ??? Other and unspecified hyperlipidemia    ??? Obesity, unspecified    ??? Other second degree atrioventricular block         Past Surgical History   Procedure Date   ??? Hx gyn    ??? Hx tubal ligation          History reviewed. No pertinent family history.     History     Social History   ??? Marital Status: SINGLE     Spouse Name: N/A     Number of Children: N/A   ??? Years of Education: N/A     Occupational History   ??? Not on file.     Social History Main Topics   ??? Smoking status: Never Smoker    ??? Smokeless tobacco: Not on file   ??? Alcohol Use: Yes      rarely   ??? Drug Use: No   ??? Sexually Active:      Other Topics Concern   ??? Not on file     Social History Narrative   ??? No narrative on file                  ALLERGIES: Review of patient's allergies indicates no known allergies.      Review of Systems   Constitutional: Negative for fever and chills.   HENT: Negative for ear pain, sore throat and facial swelling.    Eyes: Negative for pain and visual disturbance.    Respiratory: Negative for cough and shortness of breath.    Cardiovascular: Negative for chest pain and palpitations.   Gastrointestinal: Negative for nausea, vomiting, abdominal pain, diarrhea, constipation and blood in stool.   Genitourinary: Negative for dysuria, urgency, frequency, hematuria, flank pain and difficulty urinating.   Musculoskeletal: Positive for back pain. Negative for myalgias, joint swelling, arthralgias and gait problem.   Skin: Negative for color change, pallor, rash and wound.   Neurological: Negative for dizziness and headaches.   Psychiatric/Behavioral: Negative for behavioral problems. The patient is not nervous/anxious.        Filed Vitals:    05/31/12 2019   BP: 130/90   Pulse: 80   Temp: 97.5 ??F (36.4 ??C)   Resp: 18   Height: 5\' 2"  (1.575 m)   Weight: 84.369 kg (186 lb)   SpO2:  99%            Physical Exam   Nursing note and vitals reviewed.  Constitutional: She is oriented to person, place, and time. She appears well-developed and well-nourished. No distress.   HENT:   Head: Normocephalic and atraumatic.   Right Ear: Hearing, tympanic membrane, external ear and ear canal normal.   Left Ear: Hearing, tympanic membrane, external ear and ear canal normal.   Nose: Nose normal. No mucosal edema or rhinorrhea.   Mouth/Throat: Uvula is midline, oropharynx is clear and moist and mucous membranes are normal. No uvula swelling. No oropharyngeal exudate, posterior oropharyngeal edema, posterior oropharyngeal erythema or tonsillar abscesses.   Eyes: Conjunctivae and EOM are normal. Pupils are equal, round, and reactive to light. Right eye exhibits no discharge. Left eye exhibits no discharge.   Neck: Trachea normal, normal range of motion, full passive range of motion without pain and phonation normal. Neck supple. No tracheal tenderness, no spinous process tenderness and no muscular tenderness present. No rigidity. No tracheal deviation, no edema, no erythema and normal range of motion present.  No mass and no thyromegaly present.   Cardiovascular: Normal rate, regular rhythm and normal heart sounds.  Exam reveals no gallop and no friction rub.    No murmur heard.  Pulmonary/Chest: Effort normal and breath sounds normal. No accessory muscle usage or stridor. Not tachypneic. No respiratory distress. She has no decreased breath sounds. She has no wheezes. She has no rhonchi. She has no rales.   Abdominal: Soft. Bowel sounds are normal. She exhibits no mass. There is no tenderness. There is no rigidity, no rebound, no guarding, no CVA tenderness, no tenderness at McBurney's point and negative Murphy's sign.   Musculoskeletal:        Right shoulder: Normal.        Left shoulder: Normal.        Right hip: Normal.        Left hip: Normal.        Cervical back: Normal.        Thoracic back: Normal.        Lumbar back: She exhibits tenderness, pain and spasm. She exhibits normal range of motion, no bony tenderness, no swelling, no edema, no deformity, no laceration and normal pulse.        Back:         Bilateral lower paraspinal lumbar muscle TTP. No midline TTP. -SLR. Normal inspection. No midline TTP. FROM w/ pain on flexion and hyperextension. MS 5/5 BLE. NVI.    Lymphadenopathy:     She has no cervical adenopathy.   Neurological: She is alert and oriented to person, place, and time. She has normal strength and normal reflexes. No sensory deficit.   Skin: Skin is warm and dry. She is not diaphoretic.   Psychiatric: She has a normal mood and affect. Her behavior is normal.        MDM     Differential Diagnosis; Clinical Impression; Plan:     DDX: Sciatica, Lumbar radiculopathy, Back Sprain, Back Strain, DJD, HNP, Epidural abscess, Cauda Equina Syndrome, Contusion, Pyelonephritis, Renal Colic, Rib Fracture, Rib Contusion, Costochondritis, Pleurisy, Pleurodynia, Pneumothorax, Thoracic Aneurysm, Abdominal Aortic Aneurysm, Nephro-ureteral Calculus, Acute Myocardial Infarction.     Tylenol/Motrin prn. Percocet prn  breakthrough pain. Robaxin breakthrough pain. Gentle ROM as tolerated. Warm compresses. F/U Ortho in 2-4 days. Avoid lifting, pushing, pulling > 10 lbs x 1 week.     PLEASE FOLLOW-UP AS DIRECTED. RETURN TO THE  EMERGENCY DEPARTMENT IF YOU ARE UNABLE TO FOLLOW-UP AS DIRECTED.     RETURN TO THE EMERGENCY DEPARTMENT IF YOU HAVE SYMPTOMS THAT DO NOT IMPROVE WITH TREATMENT, NEW SYMPTOMS, WORSENING SYMPTOMS, OR ANY OTHER CONCERNS.     THE PATIENT AGREES WITH THE DISCHARGE PLAN AND FOLLOW-UP INSTRUCTIONS. THE PATIENT AGREES TO REVIEW ALL HANDOUTS.       Amount and/or Complexity of Data Reviewed:    Decide to obtain previous medical records or to obtain history from someone other than the patient:  Yes   Obtain history from someone other than the patient:  No   Review and summarize past medical records:  Yes   Discuss the patient with another provider:  No   Independant visualization of image, tracing, or specimen:  Yes  Risk of Significant Complications, Morbidity, and/or Mortality:   Presenting problems:  Moderate  Diagnostic procedures:  Low  Management options:  Moderate      Procedures

## 2012-05-31 NOTE — ED Notes (Signed)
States her back is hurting,  Hurt back when she was moving on monday

## 2012-05-31 NOTE — ED Provider Notes (Signed)
I was personally available for consultation in the emergency department.  I have reviewed the chart prior to the patient being discharged and agree with the documentation recorded by the MLP, including the assessment, treatment plan, and disposition.  DANIELI Lamika Connolly, MD

## 2012-06-01 MED ORDER — METHOCARBAMOL 500 MG TAB
500 mg | ORAL_TABLET | Freq: Four times a day (QID) | ORAL | Status: DC
Start: 2012-06-01 — End: 2012-11-26

## 2012-06-01 MED ORDER — OXYCODONE-ACETAMINOPHEN 5 MG-325 MG TAB
5-325 mg | ORAL_TABLET | ORAL | Status: DC | PRN
Start: 2012-06-01 — End: 2012-11-26

## 2012-06-01 MED ORDER — OXYCODONE-ACETAMINOPHEN 5 MG-325 MG TAB
5-325 mg | ORAL | Status: AC
Start: 2012-06-01 — End: 2012-05-31
  Administered 2012-06-01: 02:00:00 via ORAL

## 2012-06-01 MED FILL — OXYCODONE-ACETAMINOPHEN 5 MG-325 MG TAB: 5-325 mg | ORAL | Qty: 2

## 2012-09-20 DIAGNOSIS — I2510 Atherosclerotic heart disease of native coronary artery without angina pectoris: Secondary | ICD-10-CM | POA: Insufficient documentation

## 2012-09-20 DIAGNOSIS — I441 Atrioventricular block, second degree: Secondary | ICD-10-CM | POA: Insufficient documentation

## 2012-09-20 HISTORY — DX: Atrioventricular block, second degree: I44.1

## 2012-09-26 DIAGNOSIS — R42 Dizziness and giddiness: Secondary | ICD-10-CM | POA: Insufficient documentation

## 2012-11-26 MED ADMIN — cyclobenzaprine (FLEXERIL) tablet 10 mg: ORAL | @ 20:00:00 | NDC 68084039711

## 2012-11-26 MED ADMIN — oxyCODONE-acetaminophen (PERCOCET) 5-325 mg per tablet 1 Tab: ORAL | @ 20:00:00 | NDC 00406051223

## 2012-11-26 MED FILL — OXYCODONE-ACETAMINOPHEN 5 MG-325 MG TAB: 5-325 mg | ORAL | Qty: 1

## 2012-11-26 MED FILL — CYCLOBENZAPRINE 10 MG TAB: 10 mg | ORAL | Qty: 1

## 2012-11-26 NOTE — ED Provider Notes (Signed)
I was personally available for consultation in the emergency department.  I have reviewed the chart and agree with the documentation recorded by the MLP, including the assessment, treatment plan, and disposition.  Vitali Seibert P Aniko Finnigan, DO

## 2012-11-26 NOTE — ED Notes (Signed)
Denies change in her pain level at discharge to home.

## 2012-11-26 NOTE — ED Notes (Signed)
I have reviewed discharge instructions with the patient.  The patient verbalized understanding.  Reviewed discharge instructions with patient.  Patient voices understanding of discharge instructions.  Patient advised not to operate any machinery or drive while using narcotic medications. Patient voices understanding of narcotic use instruction. Patient instructed on appropriate follow up after discharge as recommended by physician or physician's assistant.  Patient voices understanding of follow up instructions. Patient denies any questions or concerns at time of discharge to home and appears in  no apparent distress at time of discharge to home.  Personal belongings in patient's possession at discharge.  Arm band removed and placed in shredder box.

## 2012-11-26 NOTE — ED Notes (Signed)
Patient relates lower back pain s/p fall x 3 days ago, slipped and landed on a pipe.

## 2012-11-26 NOTE — ED Provider Notes (Signed)
HPI Comments: Patient states that she fell 3 days ago striking her lower back on a metal bar. States that pain has improved mildly, but has not resolved.     Patient is a 47 y.o. female presenting with back pain. The history is provided by the patient.   Back Pain   The current episode started more than 2 days ago. The problem has been gradually improving. The problem occurs constantly. Patient reports not work related injury.The pain is associated with a fall. The pain is present in the lumbar spine. The quality of the pain is described as sharp. The pain radiates to the right thigh. The pain is moderate. The symptoms are aggravated by twisting, bending and certain positions. Pertinent negatives include no chest pain, no fever, no numbness, no abdominal pain, no bowel incontinence, no perianal numbness, no bladder incontinence, no dysuria, no pelvic pain, no paresthesias, no paresis, no tingling and no weakness. She has tried NSAIDs for the symptoms. The treatment provided no relief.        Past Medical History   Diagnosis Date   ??? Migraine    ??? GERD (gastroesophageal reflux disease)    ??? Respiratory abnormalities      bronchitis   ??? Gastrointestinal disorder      acid reflux   ??? HX OTHER MEDICAL      migraines   ??? Chest pain, unspecified    ??? Shortness of breath    ??? Other and unspecified hyperlipidemia    ??? Obesity, unspecified    ??? Other second degree atrioventricular block         Past Surgical History   Procedure Laterality Date   ??? Hx gyn     ??? Hx tubal ligation           No family history on file.     History     Social History   ??? Marital Status: SINGLE     Spouse Name: N/A     Number of Children: N/A   ??? Years of Education: N/A     Occupational History   ??? Not on file.     Social History Main Topics   ??? Smoking status: Never Smoker    ??? Smokeless tobacco: Not on file   ??? Alcohol Use: Yes      Comment: rarely   ??? Drug Use: No   ??? Sexually Active:      Other Topics Concern   ??? Not on file     Social History  Narrative   ??? No narrative on file                  ALLERGIES: Review of patient's allergies indicates no known allergies.      Review of Systems   Constitutional: Negative for fever.   Cardiovascular: Negative for chest pain.   Gastrointestinal: Negative for abdominal pain and bowel incontinence.   Genitourinary: Negative for bladder incontinence, dysuria and pelvic pain.   Musculoskeletal: Positive for back pain.   Neurological: Negative for tingling, weakness, numbness and paresthesias.       Filed Vitals:    11/26/12 1418   BP: 131/86   Pulse: 80   Temp: 98.2 ??F (36.8 ??C)   Resp: 16   Height: 5\' 2"  (1.575 m)   Weight: 79.833 kg (176 lb)   SpO2: 99%            Physical Exam   Nursing note and vitals reviewed.  Constitutional: She  is oriented to person, place, and time. She appears well-developed and well-nourished. No distress.   HENT:   Head: Normocephalic.   Eyes: Pupils are equal, round, and reactive to light.   Neck: Normal range of motion. Neck supple.   Cardiovascular: Normal rate, regular rhythm, normal heart sounds and intact distal pulses.    Pulmonary/Chest: Effort normal and breath sounds normal. No respiratory distress.   Abdominal: Soft. There is no tenderness.   Musculoskeletal:        Lumbar back: She exhibits tenderness, bony tenderness, pain and spasm. She exhibits no swelling and no deformity.        Back:    Neurological: She is alert and oriented to person, place, and time.   Skin: Skin is warm and dry. She is not diaphoretic.        MDM     Differential Diagnosis; Clinical Impression; Plan:     RADIOLOGY  Lumbar spine - no acute process. Final read pending.    Labs Reviewed - No data to display     No results found for this or any previous visit (from the past 12 hour(s)).     3:06 PM  Patients results have been reviewed with them.  Patient and/or family have verbally conveyed their understanding and agreement of the patient's signs, symptoms, diagnosis, treatment and prognosis and  additionally agree to follow up as recommended or return to the Emergency Room should their condition change prior to their follow-up appointment. Patient verbally agrees with the care-plan and verbally conveys that all of their questions have been answered.   Discharge instructions have also been provided to the patient with some educational information regarding their diagnosis as well a list of reasons why they would want to return to the ER prior to their follow-up appointment should their condition change.     CLINICAL IMPRESSION    Low back pain with sciatica   Fall    Amount and/or Complexity of Data Reviewed:   Tests in the radiology section of CPT??:  Ordered   Independant visualization of image, tracing, or specimen:  Yes  Risk of Significant Complications, Morbidity, and/or Mortality:   Presenting problems:  Low  Diagnostic procedures:  Low  Management options:  Low  Progress:   Patient progress:  Stable      Procedures

## 2012-12-12 MED ADMIN — methocarbamol (ROBAXIN) tablet 1,000 mg: ORAL | @ 18:00:00 | NDC 68084005611

## 2012-12-12 MED ADMIN — oxyCODONE-acetaminophen (PERCOCET) 5-325 mg per tablet 1 Tab: ORAL | @ 18:00:00 | NDC 00406051223

## 2012-12-12 MED ADMIN — predniSONE (DELTASONE) tablet 60 mg: ORAL | @ 18:00:00 | NDC 00143973810

## 2012-12-12 MED FILL — METHOCARBAMOL 500 MG TAB: 500 mg | ORAL | Qty: 2

## 2012-12-12 MED FILL — OXYCODONE-ACETAMINOPHEN 5 MG-325 MG TAB: 5-325 mg | ORAL | Qty: 1

## 2012-12-12 MED FILL — PREDNISONE 20 MG TAB: 20 mg | ORAL | Qty: 3

## 2012-12-12 NOTE — ED Provider Notes (Signed)
I was personally available for consultation in the emergency department. I have reviewed the chart prior to the patient's discharge and agree with the documentation recorded by the MLP, including the assessment, treatment plan, and disposition.

## 2012-12-12 NOTE — ED Notes (Signed)
Pt c/o lower back pain that radiates down bilateral legs x2 weeks. Pt reports helping a friend move when pain began.

## 2012-12-12 NOTE — ED Notes (Signed)
Pt reports being seen here previously for same on 11/26/2012. Pt states she did not take the Flexeril because it didn't help and not filling the prednisone because of cost. Pt states ran out of pain med 1 1/2 weeks ago.

## 2012-12-12 NOTE — ED Provider Notes (Signed)
[facility]      47 y.o. female presents to the ED c/o low back pain radiating down bilateral legs for the past 2 weeks.  Pt states she was helping a friend move 2 weeks ago when she fell backwards, injuring her low back.  She was seen here on 1/07, given Percocet, Flexeril, Prednisone but has not filled the Prednisone due to the cost.  She says the pain was originally going down just the back of her right leg and is now down bilateral LE.  She states the flexeril does not help. Denies any fever, chills, numbness, weakness, bowel/bladder function loss, or any other symptoms at this time.           Current Facility-Administered Medications   Medication Dose Route Frequency   ??? predniSONE (DELTASONE) tablet 60 mg  60 mg Oral NOW   ??? oxyCODONE-acetaminophen (PERCOCET) 5-325 mg per tablet 1 Tab  1 Tab Oral NOW   ??? methocarbamol (ROBAXIN) tablet 1,000 mg  1,000 mg Oral NOW     Current Outpatient Prescriptions   Medication Sig   ??? oxyCODONE-acetaminophen (PERCOCET) 5-325 mg per tablet Take 1 Tab by mouth every six (6) hours as needed for Pain.   ??? cyclobenzaprine (FLEXERIL) 10 mg tablet Take 1 Tab by mouth three (3) times daily as needed for Muscle Spasm(s).   ??? methylPREDNISolone (MEDROL, PAK,) 4 mg tablet Per dose pack instructions       Past Medical History   Diagnosis Date   ??? Migraine    ??? GERD (gastroesophageal reflux disease)    ??? Respiratory abnormalities      bronchitis   ??? Gastrointestinal disorder      acid reflux   ??? HX OTHER MEDICAL      migraines   ??? Chest pain, unspecified    ??? Shortness of breath    ??? Other and unspecified hyperlipidemia    ??? Obesity, unspecified    ??? Other second degree atrioventricular block        Past Surgical History   Procedure Laterality Date   ??? Hx gyn     ??? Hx tubal ligation         History reviewed. No pertinent family history.    History     Social History   ??? Marital Status: SINGLE     Spouse Name: N/A     Number of Children: N/A   ??? Years of Education: N/A      Occupational History   ??? Not on file.     Social History Main Topics   ??? Smoking status: Never Smoker    ??? Smokeless tobacco: Never Used   ??? Alcohol Use: Yes      Comment: rarely   ??? Drug Use: No   ??? Sexually Active: Not on file     Other Topics Concern   ??? Not on file     Social History Narrative   ??? No narrative on file       No Known Allergies    Patient's primary care provider (as noted in EPIC):  SAMIR T ABDELSHAHEED, MD    REVIEW OF SYSTEMS:  Constitutional:  Negative for fever, chills, diaphoresis.  Respiratory:  Negative for cough and shortness of breath.    Cardiovascular:  Negative for chest pain  Gastrointestinal:  Negative for n/vomiting.  Musculoskeletal: + low back pain radiating down bilateral LE.  Negative for extremity weakness.   Skin:  Negative for pallor.  Neurological:  Negative for weakness.     Visit Vitals   Item Reading   ??? BP 124/77   ??? Pulse 72   ??? Temp 98.9 ??F (37.2 ??C)   ??? Resp 16   ??? Ht 5\' 2"  (1.575 m)   ??? Wt 79.833 kg (176 lb)   ??? BMI 32.18 kg/m2   ??? SpO2 98%       PHYSICAL EXAM:  CONSTITUTIONAL:  Alert, in no apparent distress;  well developed;  well nourished.  HEAD:  Normocephalic, atraumatic.  EYES:  Non-icteric sclera.  Normal conjunctiva.  RESPIRATORY:  Chest clear, equal breath sounds, good air movement.  CARDIOVASCULAR:  Regular rate and rhythm.  No murmurs, rubs, or gallops.  GI:  Normal bowel sounds, abdomen soft and non-tender.  No rebound or guarding.    BACK:  Lower bilateral paralumbar reproducible tenderness to palpation.  No midline vertebral bony point tenderness or step-off. + bilateral straight leg raise. 5/5 muscle strength to BLE, neurovascularly intact.     UPPER EXT:  Normal inspection.  LOWER EXT:  No edema, no calf tenderness.  Distal pulses intact.  NEURO:  Moves all four extremities, and grossly normal motor exam.  SKIN:  No rashes;  Normal for age.  PSYCH:  Alert and normal affect.    DIFFERENTIAL DIAGNOSES/ MEDICAL DECISION MAKING:  Low back pain from  myofascial strain/ sprain, muscle spasm, vertebral disc problem, neuropathy to include sciatica, abscess/infection, referred retroperitoneal pain from pyelonephritis or ureterolithiasis, other etiologies versus a combination of the above (example: myofascial strain/ sprain as well as muscle spasm).    IMPRESSION AND MEDICAL DECISION MAKING:  Strain from injury 2 weeks ago with bilateral sciatica.  Pt states she could not fill the prednisone but can fill it tomorrow.  Will give dose in ED with new script.      DIAGNOSIS:  1. Low back pain  2. Sciatica     SPECIFIC PATIENT INSTRUCTIONS FROM THE PHYSICIAN ASSISTANT WHO TREATED YOU IN THE ER TODAY:  1. Prednisone as prescribed until finished.  2. Norco for pain not controlled with the ibuprofen and Robaxin.   3. Return if any concerns or worsening condition(s).  4. Follow up with the Life Coach.     Nicolette Bang, PA

## 2012-12-12 NOTE — ED Notes (Signed)
Patient relates bilateral low back pain x 2 weeks s/p helping a friend move heavy furniture and boxes. Patient relates was seen for same but unable to afford medication and did not feel like the muscle relaxer was helping.

## 2012-12-12 NOTE — ED Notes (Signed)
I have reviewed discharge instructions with the patient.  The patient verbalized understanding.  Patient armband removed and shredded

## 2013-02-04 MED ADMIN — albuterol-ipratropium (DUO-NEB) 2.5 MG-0.5 MG/3 ML: RESPIRATORY_TRACT | @ 21:00:00 | NDC 00487020101

## 2013-02-04 MED ADMIN — azithromycin (ZITHROMAX) tablet 500 mg: ORAL | @ 21:00:00 | NDC 50268009815

## 2013-02-04 MED ADMIN — predniSONE (DELTASONE) tablet 60 mg: ORAL | @ 21:00:00 | NDC 00143973801

## 2013-02-04 NOTE — ED Notes (Signed)
Pt also  Co body aches

## 2013-02-04 NOTE — ED Provider Notes (Signed)
HPI Comments: Kimberly Lopez is a 47 y.o. female with a noted medical history who presents to the ED complaints of dyspnea onset yesterday. Pt also complains of generalized body aches. Pt has a history of bronchitis and reports that she rode with people who were chain smokers and says that she has trouble breathing around smoke. Pt has been coughing all day, and says that the coughing gets worse when she lies down. No other symptoms or complaints were presented at this time.     4:48 PM 02/04/2013    The history is provided by the patient.        Past Medical History   Diagnosis Date   ??? Migraine    ??? GERD (gastroesophageal reflux disease)    ??? Respiratory abnormalities      bronchitis   ??? Gastrointestinal disorder      acid reflux   ??? HX OTHER MEDICAL      migraines   ??? Chest pain, unspecified    ??? Shortness of breath    ??? Other and unspecified hyperlipidemia    ??? Obesity, unspecified    ??? Other second degree atrioventricular block    ??? Chronic obstructive pulmonary disease      bronchitis        Past Surgical History   Procedure Laterality Date   ??? Hx gyn     ??? Hx tubal ligation           Family History   Problem Relation Age of Onset   ??? Cancer Other    ??? Diabetes Other    ??? Heart Disease Other    ??? Hypertension Other    ??? Stroke Other         History     Social History   ??? Marital Status: SINGLE     Spouse Name: N/A     Number of Children: N/A   ??? Years of Education: N/A     Occupational History   ??? Not on file.     Social History Main Topics   ??? Smoking status: Former Smoker   ??? Smokeless tobacco: Never Used   ??? Alcohol Use: Yes      Comment: rarely   ??? Drug Use: No   ??? Sexually Active: Not on file     Other Topics Concern   ??? Not on file     Social History Narrative   ??? No narrative on file                  ALLERGIES: Review of patient's allergies indicates no known allergies.      Review of Systems   Constitutional: Negative.    HENT: Negative.    Eyes: Negative.    Respiratory:        See HPI    Cardiovascular: Negative.    Gastrointestinal: Negative.    Endocrine: Negative.    Genitourinary: Negative.    Musculoskeletal:        See HPI   Skin: Negative.    Allergic/Immunologic: Negative.    Neurological: Negative.    Hematological: Negative.    Psychiatric/Behavioral: Negative.    All other systems reviewed and are negative.        Filed Vitals:    02/04/13 1643   BP: 130/69   Pulse: 85   Temp: 98.1 ??F (36.7 ??C)   Resp: 20   Height: 5\' 2"  (1.575 m)   Weight: 79.833 kg (176 lb)   SpO2:  99%            Physical Exam     CONSTITUTIONAL: Alert, in no apparent distress; well-developed; well-nourished.   HEAD:  Normocephalic, atraumatic.   EYES: PERRL; EOM's intact.   ENTM: Nose: no rhinorrhea; Throat: mucous membranes moist. TMs-normal bilaterally. Posterior pharynx-normal.  Neck:  No JVD, supple without lymphadenopathy.  RESP: Bilateral wheezing.  CV: S1 and S2 WNL; No murmurs, gallops or rubs.   GI: Abdomen soft and non-tender. No masses or organomegaly.   UPPER EXT:  Normal inspection.   LOWER EXT: No edema.   NEURO: CN II-XII intact, strength 5/5 and sym, sensation intact.   SKIN: No rashes; Normal for age and stage.   PSYCH:  Alert and oriented, normal affect.      MDM     Differential Diagnosis; Clinical Impression; Plan:     Cough-RAD and bronchitis and pneumonia  Amount and/or Complexity of Data Reviewed:    Review and summarize past medical records:  Yes  Progress:   Patient progress:  Stable      Procedures    Scribe Attestation:  Veverly Fells. Powers February 04, 2013 at 4:47 PM scribing for and in the presence of Dr.Khyle Goodell Baron Hamper, MD     Veverly Fells. Powers, Editor, commissioning:   I personally performed the services described in the documentation, reviewed the documentation, as recorded by the scribe in my presence, and it accurately and completely records my words and actions. February 04, 2013 at 4:56 PM - Sharilyn Sites, MD   (PROVIDER ATTESTATION)

## 2013-02-04 NOTE — ED Notes (Signed)
Kimberly Lopez is a 47 y.o. female that was discharged in stable. Pt was accompanied by friend. Pt is not driving. The patients diagnosis, condition and treatment were explained to  patient and aftercare instructions were given.  The patient verbalized understanding. Patient armband removed and shredded.

## 2013-02-04 NOTE — ED Notes (Signed)
Pt states started with fever and congestion this am

## 2013-09-11 NOTE — ED Provider Notes (Signed)
[facility]  EMERGENCY DEPARTMENT TREATMENT REPORT  NAME:  Kimberly Lopez  SEX:   F  ADMIT: 09/10/2013  DOB:   27-Jan-1966  MR#    [id number]  ROOM:    TIME DICTATED: 11 35 PM  ACCT#  [id number]        FAMILY PHYSICIAN:  Does not have one.      CHIEF COMPLAINT:  The patient reports multiple pains associated with fall.    HISTORY OF PRESENT ILLNESS:  The patient is a 47 year old female.  She reports she was walking into her   apartment.  The door came loose and she tripped and fell.  She states she   landed on her back.  She reports that she hit her head.  She has a headache.    She reports neck pain.  The patient reports that her entire spine hurts, and   she is also reporting pain in her right knee and tibia-fibula area.  The   patient arrives per EMS.  She is on a backboard and C-collar.  The patient was   asked if the pain became worse when she was placed on a backboard.  The   patient states no, the pain has been the same ever since the initial injury   occurred.  The patient denies any loss of bowel or bladder.  She denies any   inability to move any of her extremities, nausea, vomiting or loss of   consciousness.    REVIEW OF SYSTEMS:      CONSTITUTIONAL:  No fever, chills, or weight loss.   EYES:  No visual symptoms.   ENT:  No sore throat, runny nose, or other URI symptoms.   RESPIRATORY:  No cough, shortness of breath, or wheezing.   CARDIOVASCULAR:  No chest pain, chest pressure, or palpitations.   GASTROINTESTINAL:  No vomiting, diarrhea, or abdominal pain.   MUSCULOSKELETAL:  The patient reports neck pain, back pain all the way down   her back and right knee pain and tibia-fibula pain.    INTEGUMENTARY:  No rashes.   NEUROLOGICAL:  The patient reports a headache.  She believes that this   occurred from her hitting her head.  She denies any sensory or motor deficits.    PAST MEDICAL HISTORY:  Chronic bronchitis.    PAST SURGICAL HISTORY:  No surgical history.    SOCIAL HISTORY:   The patient denies smoking, alcohol or drug use.    ALLERGIES:  NO KNOWN DRUG ALLERGIES.    MEDICATIONS:  Currently not taking any medications.    PHYSICAL EXAMINATION:  VITAL SIGNS:  Blood pressure is 146/92, pulse is 74, respirations 20,   temperature is 98.1, pain is 10 out of 10, O2 sat is 99% on room air.  CONSTITUTIONAL:  The patient appears well developed and well nourished.  The   patient appears agitated while she is in the ER.  She is asking for pain   medication.  She states that her pain is absolutely excruciating.  HEENT:  Eyes:  Conjunctivae clear, lids normal.  Pupils equal, symmetrical,   and normally reactive.  Extraocular movements are intact.  Ears/Nose:  Hearing   is grossly intact to voice. Internal and external examinations of the ears   and nose are unremarkable.   Mouth/Throat:  Surfaces of the pharynx, palate,   and tongue are pink, moist, and without lesions.    RESPIRATORY:  Clear and equal breath sounds.  No respiratory distress,   tachypnea,  or accessory muscle use.   CARDIOVASCULAR:  Heart regular without murmurs, gallops, rubs, or thrills.   CHEST:  Chest symmetrical without masses or tenderness.   GI:  Abdomen soft, nontender, without complaint of pain to palpation.  No   hepatomegaly or splenomegaly.  There are no abdominal masses appreciated by   inspection or palpation.  MUSCULOSKELETAL:  The patient has localized cervical, thoracic and lumbosacral   bony tenderness to palpation.  Basically, her entire spine hurts when you are   palpating on it.  She states it is excruciating and not that 1 area hurts   more than the other.  The patient has no obvious bony stepoffs, ecchymosis, or   areas of soft tissue swelling or deformities.  The patient refuses to comply   and raise her right leg for a straight leg raise.  Left leg:  The patient only   rates it 2 inches off the bed and states this is the most she can raise it.     DP pulses 2+ and equal bilaterally.  SPINE:  There is no localized cervical,   thoracic, lumbar, or sacral bony tenderness to palpation or fist percussion.    There are no bony step-offs, ecchymosis, areas of soft tissue swelling, or   deformities.   SKIN:  Warm and dry without rashes.   NEUROLOGIC:  The patient is alert and oriented.  The patient is not compliant   with motor strength testing to the lower extremities.  Upper body strength is   equal and symmetric.  The patient has no decreased grip strength.  There is no   facial asymmetry or dysarthria.  Cranial nerves II-XII are intact.    INITIAL ASSESSMENT AND TREATMENT PLAN:  The patient is being evaluated for multiple areas of pain after a fall today.    The patient was assessed on 2 occasions, once while the backboard was being   removed, and after she was lying in bed with just the C-collar, the patient   reports pain in all the same areas upon 2nd reassessment.  The patient is   being evaluated for any acute injury or trauma.  CT of the head and neck were   done today along with x-ray of the tibia-fibula area and her thoracic and   lumbar spine.  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  The patient is aware of the findings.    The C-spine shows some cervical lordosis due to muscle spasm or positioning.    There was no acute fracture noted.  The lumbosacral spine showed no acute   fracture along with the thoracic spine.  The patient's tibia-fibula showed no   acute fracture as well.  The patient was removed from her C-collar.  She was   asked to sit up in bed.  The patient stated that she was continuing to have   pain in all of the same places.  She was given a Percocet.  The patient   reports some relief from the Percocet.  The patient was able to move her legs   without any difficulty and comply with straight leg raises.  When reassessed,    the patient reports that her pain had decreased, but she was not pain free   yet.      DIAGNOSES:      1.  Neck pain, back pain, diffuse.  2.  Right knee contusion.  DISPOSITION:    The patient is being discharged home.  She is receiving prescriptions for   Vicodin and Robaxin.  The patient is to take the medication as directed.  The   patient is to follow up with primary care for further evaluation.  If the pain   continues, either use ice or heat to the areas.  Return to the ER if she has   any worsening or concerning symptoms.      The patient was personally evaluated by myself and Dr. Vinnie Langton, who agrees   with the above assessment and plan.      ___________________  Smitty Cords MD  Dictated By: Eliot Ford, NP    My signature above authenticates this document and my orders, the final  diagnosis (es), discharge prescription (s), and instructions in the [facility]   P[facility] record.  Nursing notes have been reviewed by the physician/mid-level provider.    If you have any questions please contact [phone].    KB  D:09/10/2013 23:35:13  T: 09/11/2013 02:19:40  [id number]  Authenticated by Smitty Cords, M.D. On 09/15/2013 06:29:58 AM

## 2014-05-03 ENCOUNTER — Emergency Department (HOSPITAL_COMMUNITY)
Admission: EM | Admit: 2014-05-03 | Discharge: 2014-05-03 | Disposition: A | Discharge status: Home / Self Care | Payer: Self-pay | Attending: Emergency Medicine | Admitting: Emergency Medicine

## 2014-05-03 ENCOUNTER — Encounter (HOSPITAL_COMMUNITY): Payer: Self-pay | Admitting: Emergency Medicine

## 2014-05-03 DIAGNOSIS — J302 Other seasonal allergic rhinitis: Secondary | ICD-10-CM

## 2014-05-03 DIAGNOSIS — I2510 Atherosclerotic heart disease of native coronary artery without angina pectoris: Secondary | ICD-10-CM | POA: Insufficient documentation

## 2014-05-03 DIAGNOSIS — Z8639 Personal history of other endocrine, nutritional and metabolic disease: Secondary | ICD-10-CM | POA: Insufficient documentation

## 2014-05-03 DIAGNOSIS — Z862 Personal history of diseases of the blood and blood-forming organs and certain disorders involving the immune mechanism: Secondary | ICD-10-CM | POA: Insufficient documentation

## 2014-05-03 DIAGNOSIS — J45901 Unspecified asthma with (acute) exacerbation: Secondary | ICD-10-CM | POA: Insufficient documentation

## 2014-05-03 DIAGNOSIS — J45909 Unspecified asthma, uncomplicated: Secondary | ICD-10-CM

## 2014-05-03 HISTORY — DX: Pure hypercholesterolemia, unspecified: E78.00

## 2014-05-03 HISTORY — DX: Atherosclerotic heart disease of native coronary artery without angina pectoris: I25.10

## 2014-05-03 HISTORY — DX: Unspecified asthma, uncomplicated: J45.909

## 2014-05-03 HISTORY — DX: Bronchitis, not specified as acute or chronic: J40

## 2014-05-03 MED ORDER — ALBUTEROL SULFATE HFA 108 (90 BASE) MCG/ACT IN AERS
2.0000 | INHALATION_SPRAY | RESPIRATORY_TRACT | Status: DC | PRN
  Administered 2014-05-03: 2 via RESPIRATORY_TRACT
  Filled 2014-05-03: qty 6.7

## 2014-05-03 MED ORDER — PREDNISONE 20 MG PO TABS: 40.0000 mg | ORAL_TABLET | Freq: Every day | ORAL | Status: DC

## 2014-05-03 NOTE — ED Notes (Signed)
Pt reports recently moving here, increase in her asthma and bronchitis symptoms. No relief with one breathing tx and her inhalers. No resp distress noted at triage, speaking in full sentences.

## 2014-05-03 NOTE — ED Provider Notes (Signed)
Medical screening examination/treatment/procedure(s) were performed by non-physician practitioner and as supervising physician I was immediately available for consultation/collaboration.   EKG Interpretation None       Juanpablo Ciresi, MD 05/03/14 1754 

## 2014-05-03 NOTE — Discharge Instructions (Signed)
Take the prescribed medication as directed. Recommend starting daily allergy medication-- zyrtec, claritin, allegra, etc. Follow-up with the [facility]. Return to the ED for new or worsening symptoms.

## 2014-05-03 NOTE — ED Provider Notes (Signed)
CSN: [id number]     Arrival date & time 05/03/14  [id number] History   First MD Initiated Contact with Patient 05/03/14 1106    This chart was scribed for Sharilyn SitesLisa Vanesha Athens PA-C, a non-physician practitioner working with Doug SouSam Jacubowitz, MD by Lewanda RifeAlexandra Hurtado, ED Scribe. This patient was seen in room TR07C/TR07C and the patient's care was started at 11:12 AM      Chief Complaint  Patient presents with  . Asthma     (Consider location/radiation/quality/duration/timing/severity/associated sxs/prior Treatment) The history is provided by the patient. No language interpreter was used.   HPI Comments: [name] is a 48 y.o. female who presents to the Emergency Department complaining of asthma exacerbation onset 2 weeks since recently moving here from [place]. Describes asthma as constant and gradually worsening in severity. Reports associated wheezing, non-productive cough, congestion, environmental allergies, sneezing, itching etc.  Reports trying an albuterol inhaler with mild relief of symptoms. Pt does not have a nebulizer machine.  Denies any aggravating factors. Denies associated fever, and difficulty swallowing. No known sick contacts.  Denies chest pain or SOB.  VS stable on arrival.  Past Medical History  Diagnosis Date  . Coronary artery disease   . Asthma   . Bronchitis   . High cholesterol    History reviewed. No pertinent past surgical history. History reviewed. No pertinent family history. History  Substance Use Topics  . Smoking status: Not on file  . Smokeless tobacco: Not on file  . Alcohol Use: No   OB History   Grav Para Term Preterm Abortions TAB SAB Ect Mult Living                 Review of Systems  Constitutional: Negative for fever.  HENT: Negative for trouble swallowing.   Respiratory: Positive for cough, shortness of breath and wheezing.   Allergic/Immunologic: Positive for environmental allergies.      Allergies  Review of patient's allergies indicates  no known allergies.  Home Medications   Prior to Admission medications   Not on File   BP 125/73  Pulse 64  Temp(Src) 98.1 F (36.7 C) (Oral)  Resp 18  SpO2 100%  Physical Exam  Nursing note and vitals reviewed. Constitutional: She is oriented to person, place, and time. She appears well-developed and well-nourished. No distress.  HENT:  Head: Normocephalic and atraumatic.  Right Ear: Tympanic membrane and ear canal normal.  Left Ear: Tympanic membrane and ear canal normal.  Nose: Nose normal.  Mouth/Throat: Uvula is midline, oropharynx is clear and moist and mucous membranes are normal. No oropharyngeal exudate, posterior oropharyngeal edema, posterior oropharyngeal erythema or tonsillar abscesses.  HEENT exam WNL; Airway patent  Eyes: Conjunctivae and EOM are normal.  Neck: Normal range of motion. Neck supple. No tracheal deviation present.  Cardiovascular: Normal rate.   Pulmonary/Chest: Effort normal and breath sounds normal. No respiratory distress. She has no wheezes. She has no rhonchi. She has no rales.  Respirations unlabored, no wheezes or rhonchi, speaking in full complete sentences without difficulty  Musculoskeletal: Normal range of motion.  Neurological: She is alert and oriented to person, place, and time.  Skin: Skin is warm and dry.  Psychiatric: She has a normal mood and affect. Her behavior is normal.    ED Course  Procedures (including critical care time) COORDINATION OF CARE:  Nursing notes reviewed. Vital signs reviewed. Initial pt interview and examination performed.   Filed Vitals:   05/03/14 0929  BP: 125/73  Pulse: 64  Temp:  98.1 F (36.7 C)  TempSrc: Oral  Resp: 18  SpO2: 100%    11:18 AM-Discussed work up plan with pt at bedside, which includes No orders of the defined types were placed in this encounter.  . Pt agrees with plan.   Treatment plan initiated: Medications  albuterol (PROVENTIL HFA;VENTOLIN HFA) 108 (90 BASE) MCG/ACT  inhaler 2 puff (2 puffs Inhalation Given 05/03/14 1120)     Initial diagnostic testing ordered.        Labs Review Labs Reviewed - No data to display  Imaging Review No results found.   EKG Interpretation None      MDM   Final diagnoses:  Asthma  Seasonal allergies   This is a 48 year old female presenting to the ED for increased in her asthma symptoms after [place] to [place] 2 [place] ago from [place]. On exam she is afebrile and overall nontoxic appearing. Her lungs are clear bilaterally without wheezes or rhonchi. She is no respiratory distress and is able to speak in full complete sentences without difficulty. No chest pain to suggest ACS.  After looking at patient's inhaler, noticed it expired 2 months ago. Pt given new albuterol inhaler in ED, will discharge home with steroid taper.  Recommended starting daily allergy medication such as zyrtec, allegra, claritin, etc.  FU with PCP.  Discussed plan with patient, he/she acknowledged understanding and agreed with plan of care.  Return precautions given for new or worsening symptoms.  I personally performed the services described in this documentation, which was scribed in my presence. The recorded information has been reviewed and is accurate.   Garlon HatchetLisa M Brianny Soulliere, PA-C 05/03/14 1321

## 2014-05-21 ENCOUNTER — Emergency Department (HOSPITAL_COMMUNITY)
Admission: EM | Admit: 2014-05-21 | Discharge: 2014-05-21 | Disposition: A | Discharge status: Home / Self Care | Payer: Self-pay | Attending: Emergency Medicine | Admitting: Emergency Medicine

## 2014-05-21 ENCOUNTER — Emergency Department (HOSPITAL_COMMUNITY): Payer: Self-pay

## 2014-05-21 ENCOUNTER — Encounter (HOSPITAL_COMMUNITY): Payer: Self-pay | Admitting: Emergency Medicine

## 2014-05-21 DIAGNOSIS — J45909 Unspecified asthma, uncomplicated: Secondary | ICD-10-CM | POA: Insufficient documentation

## 2014-05-21 DIAGNOSIS — F17200 Nicotine dependence, unspecified, uncomplicated: Secondary | ICD-10-CM | POA: Insufficient documentation

## 2014-05-21 DIAGNOSIS — Z862 Personal history of diseases of the blood and blood-forming organs and certain disorders involving the immune mechanism: Secondary | ICD-10-CM | POA: Insufficient documentation

## 2014-05-21 DIAGNOSIS — IMO0002 Reserved for concepts with insufficient information to code with codable children: Secondary | ICD-10-CM | POA: Insufficient documentation

## 2014-05-21 DIAGNOSIS — S43499A Other sprain of unspecified shoulder joint, initial encounter: Secondary | ICD-10-CM | POA: Insufficient documentation

## 2014-05-21 DIAGNOSIS — Y929 Unspecified place or not applicable: Secondary | ICD-10-CM | POA: Insufficient documentation

## 2014-05-21 DIAGNOSIS — Y939 Activity, unspecified: Secondary | ICD-10-CM | POA: Insufficient documentation

## 2014-05-21 DIAGNOSIS — S46911A Strain of unspecified muscle, fascia and tendon at shoulder and upper arm level, right arm, initial encounter: Secondary | ICD-10-CM

## 2014-05-21 DIAGNOSIS — I2510 Atherosclerotic heart disease of native coronary artery without angina pectoris: Secondary | ICD-10-CM | POA: Insufficient documentation

## 2014-05-21 DIAGNOSIS — R296 Repeated falls: Secondary | ICD-10-CM | POA: Insufficient documentation

## 2014-05-21 DIAGNOSIS — Z8639 Personal history of other endocrine, nutritional and metabolic disease: Secondary | ICD-10-CM | POA: Insufficient documentation

## 2014-05-21 DIAGNOSIS — S46819A Strain of other muscles, fascia and tendons at shoulder and upper arm level, unspecified arm, initial encounter: Principal | ICD-10-CM

## 2014-05-21 MED ORDER — HYDROCODONE-ACETAMINOPHEN 5-325 MG PO TABS: 1.0000 | ORAL_TABLET | Freq: Four times a day (QID) | ORAL | Status: DC | PRN

## 2014-05-21 MED ORDER — IBUPROFEN 800 MG PO TABS: 800.0000 mg | ORAL_TABLET | Freq: Three times a day (TID) | ORAL | Status: DC | PRN

## 2014-05-21 NOTE — Discharge Instructions (Signed)
Return here as needed. Follow up with the orthopedist provided.Use ice and heat on your shoulder.

## 2014-05-21 NOTE — ED Notes (Signed)
Patient states she fell on R arm/shoulder approximately 1 year ago and still has pain from it.   Patient denies further injury.

## 2014-05-21 NOTE — ED Provider Notes (Signed)
CSN: [id number]     Arrival date & time 05/21/14  [id number] History  This chart was scribed for non-physician practitioner Charlestine Nighthristopher Shayne Diguglielmo working with Flint MelterElliott L Wentz, MD by Carl Bestelina Holson, ED Scribe. This patient was seen in room TR11C/TR11C and the patient's care was started at 10:00 AM.     No chief complaint on file.  The history is provided by the patient. No language interpreter was used.   HPI Comments: Angelica Miller is a 48 y.o. female who presents to the Emergency Department complaining of constant right shoulder pain that started a year ago after she fell on it a year ago.  She states that she was seen in the ED when she fell a year ago but was told that there was no injury to her right shoulder.  She states that she did not have any x-rays taken at the time of her visit.  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  She denies having any allergies to any medications.   Past Medical History  Diagnosis Date  . Coronary artery disease   . Asthma   . Bronchitis   . High cholesterol    No past surgical history on file. No family history on file. History  Substance Use Topics  . Smoking status: Not on file  . Smokeless tobacco: Not on file  . Alcohol Use: No   OB History   Grav Para Term Preterm Abortions TAB SAB Ect Mult Living                 Review of Systems  Musculoskeletal: Positive for arthralgias.  All other systems reviewed and are negative.     Allergies  Review of patient's allergies indicates no known allergies.  Home Medications   Prior to Admission medications   Medication Sig Start Date End Date Taking? Authorizing Provider  predniSONE (DELTASONE) 20 MG tablet Take 2 tablets (40 mg total) by mouth daily. Take 40 mg by mouth daily for 3 days, then 20mg  by mouth daily for 3 days, then 10mg  daily for 3 days 05/03/14   Garlon HatchetLisa M Sanders, PA-C   Triage Vitals: BP 137/74  Pulse 70  Temp(Src) 97.4 F (36.3 C)  (Oral)  Resp 20  Ht 5\' 2"  (1.575 m)  Wt 160 lb (72.576 kg)  BMI 29.26 kg/m2  SpO2 100%  Physical Exam  Nursing note and vitals reviewed. Constitutional: She is oriented to person, place, and time. She appears well-developed and well-nourished. No distress.  HENT:  Head: Normocephalic and atraumatic.  Cardiovascular: Normal rate.   Pulmonary/Chest: Effort normal.  Musculoskeletal:       Arms: Diffuse palpable pain to the right shoulder.  Pain with range of motion.   Neurological: She is alert and oriented to person, place, and time.  Skin: Skin is warm and dry. She is not diaphoretic.  Psychiatric: She has a normal mood and affect.    ED Course  Procedures (including critical care time)  DIAGNOSTIC STUDIES: Oxygen Saturation is 100% on room air, normal by my interpretation.    COORDINATION OF CARE: 10:01 AM- Discussed obtaining an x-ray of the patient's right shoulder and a clinical suspicion of a muscle strain with the patient and the patient agreed to the treatment plan.   I personally performed the services described in this documentation, which was scribed in my presence. The recorded information has been reviewed and is accurate.   Carlyle Dollyhristopher W Charlottie Peragine,  PA-C 05/23/14 1516

## 2014-05-31 NOTE — ED Provider Notes (Signed)
Medical screening examination/treatment/procedure(s) were performed by non-physician practitioner and as supervising physician I was immediately available for consultation/collaboration.   EKG Interpretation None       Flint MelterElliott L Kaveon Blatz, MD 05/31/14 [phone]

## 2014-07-02 ENCOUNTER — Emergency Department (HOSPITAL_COMMUNITY): Payer: Self-pay

## 2014-07-02 ENCOUNTER — Encounter (HOSPITAL_COMMUNITY): Payer: Self-pay | Admitting: Emergency Medicine

## 2014-07-02 ENCOUNTER — Emergency Department (HOSPITAL_COMMUNITY)
Admission: EM | Admit: 2014-07-02 | Discharge: 2014-07-02 | Disposition: A | Discharge status: Home / Self Care | Payer: Self-pay | Attending: Emergency Medicine | Admitting: Emergency Medicine

## 2014-07-02 DIAGNOSIS — I2510 Atherosclerotic heart disease of native coronary artery without angina pectoris: Secondary | ICD-10-CM | POA: Insufficient documentation

## 2014-07-02 DIAGNOSIS — Z79899 Other long term (current) drug therapy: Secondary | ICD-10-CM | POA: Insufficient documentation

## 2014-07-02 DIAGNOSIS — Z8639 Personal history of other endocrine, nutritional and metabolic disease: Secondary | ICD-10-CM | POA: Insufficient documentation

## 2014-07-02 DIAGNOSIS — Z9889 Other specified postprocedural states: Secondary | ICD-10-CM | POA: Insufficient documentation

## 2014-07-02 DIAGNOSIS — IMO0002 Reserved for concepts with insufficient information to code with codable children: Secondary | ICD-10-CM | POA: Insufficient documentation

## 2014-07-02 DIAGNOSIS — Z862 Personal history of diseases of the blood and blood-forming organs and certain disorders involving the immune mechanism: Secondary | ICD-10-CM | POA: Insufficient documentation

## 2014-07-02 DIAGNOSIS — Y9389 Activity, other specified: Secondary | ICD-10-CM | POA: Insufficient documentation

## 2014-07-02 DIAGNOSIS — S4980XA Other specified injuries of shoulder and upper arm, unspecified arm, initial encounter: Secondary | ICD-10-CM | POA: Insufficient documentation

## 2014-07-02 DIAGNOSIS — F17200 Nicotine dependence, unspecified, uncomplicated: Secondary | ICD-10-CM | POA: Insufficient documentation

## 2014-07-02 DIAGNOSIS — X500XXA Overexertion from strenuous movement or load, initial encounter: Secondary | ICD-10-CM | POA: Insufficient documentation

## 2014-07-02 DIAGNOSIS — Y9289 Other specified places as the place of occurrence of the external cause: Secondary | ICD-10-CM | POA: Insufficient documentation

## 2014-07-02 DIAGNOSIS — S46909A Unspecified injury of unspecified muscle, fascia and tendon at shoulder and upper arm level, unspecified arm, initial encounter: Secondary | ICD-10-CM | POA: Insufficient documentation

## 2014-07-02 DIAGNOSIS — J45909 Unspecified asthma, uncomplicated: Secondary | ICD-10-CM | POA: Insufficient documentation

## 2014-07-02 DIAGNOSIS — S46911A Strain of unspecified muscle, fascia and tendon at shoulder and upper arm level, right arm, initial encounter: Secondary | ICD-10-CM

## 2014-07-02 HISTORY — DX: Unspecified dislocation of unspecified shoulder joint, initial encounter: S43.006A

## 2014-07-02 MED ORDER — HYDROCODONE-ACETAMINOPHEN 5-325 MG PO TABS
1.0000 | ORAL_TABLET | Freq: Once | ORAL | Status: AC
  Administered 2014-07-02: 1 via ORAL
  Filled 2014-07-02: qty 1

## 2014-07-02 MED ORDER — HYDROCODONE-ACETAMINOPHEN 5-325 MG PO TABS: 1.0000 | ORAL_TABLET | ORAL | Status: DC | PRN

## 2014-07-02 MED ORDER — NAPROXEN 500 MG PO TABS: 500.0000 mg | ORAL_TABLET | Freq: Two times a day (BID) | ORAL | Status: DC

## 2014-07-02 NOTE — ED Notes (Signed)
Patient transported to X-ray 

## 2014-07-02 NOTE — ED Notes (Signed)
Pt given 150 mcg fentanyl in route to hospital

## 2014-07-02 NOTE — ED Notes (Signed)
Per EMS pt getting dressed and felt right shoulder dislocate; previous history of same;

## 2014-07-02 NOTE — Discharge Instructions (Signed)
Naprosyn for inflammation. norco for pain. Ice. Sling. Follow up with orthopedics.     Shoulder Pain The shoulder is the joint that connects your arms to your body. The bones that form the shoulder joint include the upper arm bone (humerus), the shoulder blade (scapula), and the collarbone (clavicle). The top of the humerus is shaped like a ball and fits into a rather flat socket on the scapula (glenoid cavity). A combination of muscles and strong, fibrous tissues that connect muscles to bones (tendons) support your shoulder joint and hold the ball in the socket. Small, fluid-filled sacs (bursae) are located in different areas of the joint. They act as cushions between the bones and the overlying soft tissues and help reduce friction between the gliding tendons and the bone as you move your arm. Your shoulder joint allows a wide range of motion in your arm. This range of motion allows you to do things like scratch your back or throw a ball. However, this range of motion also makes your shoulder more prone to pain from overuse and injury. Causes of shoulder pain can originate from both injury and overuse and usually can be grouped in the following four categories:  Redness, swelling, and pain (inflammation) of the tendon (tendinitis) or the bursae (bursitis).  Instability, such as a dislocation of the joint.  Inflammation of the joint (arthritis).  Broken bone (fracture). HOME CARE INSTRUCTIONS   Apply ice to the sore area.  Put ice in a plastic bag.  Place a towel between your skin and the bag.  Leave the ice on for 15-20 minutes, 3-4 times per day for the first 2 days, or as directed by your health care provider.  Stop using cold packs if they do not help with the pain.  If you have a shoulder sling or immobilizer, wear it as long as your caregiver instructs. Only remove it to shower or bathe. Move your arm as little as possible, but keep your hand moving to prevent swelling.  Squeeze a  soft ball or foam pad as much as possible to help prevent swelling.  Only take over-the-counter or prescription medicines for pain, discomfort, or fever as directed by your caregiver. SEEK MEDICAL CARE IF:   Your shoulder pain increases, or new pain develops in your arm, hand, or fingers.  Your hand or fingers become cold and numb.  Your pain is not relieved with medicines. SEEK IMMEDIATE MEDICAL CARE IF:   Your arm, hand, or fingers are numb or tingling.  Your arm, hand, or fingers are significantly swollen or turn white or blue. MAKE SURE YOU:   Understand these instructions.  Will watch your condition.  Will get help right away if you are not doing well or get worse. Document Released: 08/16/2005 Document Revised: 03/23/2014 Document Reviewed: 10/21/2011 [facility] Patient Information 2015 [place]. This information is not intended to replace advice given to you by your health care provider. Make sure you discuss any questions you have with your health care provider.

## 2014-07-02 NOTE — ED Provider Notes (Signed)
CSN: [id number]     Arrival date & time 07/02/14  1259 History   First MD Initiated Contact with Patient 07/02/14 1305     Chief Complaint  Patient presents with  . Shoulder Injury     (Consider location/radiation/quality/duration/timing/severity/associated sxs/prior Treatment) HPI Angelica Miller is a 48 y.o. female who presents emergency department complaining of right shoulder injury. Patient states that she was getting dressed and states was putting her bra on when her right shoulder popped out of place. She states she felt a pop and could not move her right arm after that. Patient reports history of dislocations in the past. She states she called EMS and they put her in a homemade splint, and she thinks it popped back and at that time. She continues to have pain in her right shoulder. She denies any numbness or weakness in the arm. She still has pain with movement of the shoulder joint. She denies any other injuries. She denies any weakness in her hand. She has not seen orthopedics specialist for this problem. She received 150 mcg of fentanyl and route.  Past Medical History  Diagnosis Date  . Coronary artery disease   . Asthma   . Bronchitis   . High cholesterol   . Shoulder dislocation    History reviewed. No pertinent past surgical history. No family history on file. History  Substance Use Topics  . Smoking status: Current Some Day Smoker    Types: Cigars  . Smokeless tobacco: Never Used  . Alcohol Use: Yes     Comment: occasionally   OB History   Grav Para Term Preterm Abortions TAB SAB Ect Mult Living                 Review of Systems  Constitutional: Negative for fever and chills.  Respiratory: Negative for cough, chest tightness and shortness of breath.   Cardiovascular: Negative for chest pain, palpitations and leg swelling.  Musculoskeletal: Positive for arthralgias and joint swelling. Negative for myalgias, neck pain and neck stiffness.  Skin: Negative for  rash.  Neurological: Negative for dizziness, weakness, numbness and headaches.  All other systems reviewed and are negative.     Allergies  Review of patient's allergies indicates no known allergies.  Home Medications   Prior to Admission medications   Medication Sig Start Date End Date Taking? Authorizing Provider  albuterol (PROVENTIL HFA;VENTOLIN HFA) 108 (90 BASE) MCG/ACT inhaler Inhale 2 puffs into the lungs every 6 (six) hours as needed for wheezing or shortness of breath.   Yes Historical Provider, MD  diphenhydrAMINE (SOMINEX) 25 MG tablet Take 50 mg by mouth daily.   Yes Historical Provider, MD  guaiFENesin (MUCINEX) 600 MG 12 hr tablet Take 1,200 mg by mouth 2 (two) times daily as needed (sinuses).   Yes Historical Provider, MD   BP 139/86  Pulse 73  Temp(Src) 97.9 F (36.6 C) (Oral)  Resp 16  SpO2 95% Physical Exam  Nursing note and vitals reviewed. Constitutional: She is oriented to person, place, and time. She appears well-developed and well-nourished. No distress.  HENT:  Head: Normocephalic.  Eyes: Conjunctivae are normal.  Neck: Neck supple.  Cardiovascular: Normal rate, regular rhythm and normal heart sounds.   Pulmonary/Chest: Effort normal and breath sounds normal. No respiratory distress. She has no wheezes. She has no rales.  Musculoskeletal: She exhibits no edema.  Normal appearing right shoulder, with no obvious deformities or swelling. It is not warm to the touch, and it is not erythematous.  Range of motion passively of the shoulder joint in all directions, limited range of motion actively due to pain. Positive straight arm drop test. Sensation intact over all deltoids. Grip strength is intact. Patient is able to cross second and third digits, able to do thumbs up. Sensation intact distally in all dermatomal distributions. Distal radial pulses intact.  Neurological: She is alert and oriented to person, place, and time.  Skin: Skin is warm and dry.   Psychiatric: She has a normal mood and affect. Her behavior is normal.    ED Course  Procedures (including critical care time) Labs Review Labs Reviewed - No data to display  Imaging Review Dg Shoulder Right  07/02/2014   CLINICAL DATA:  Right shoulder pain  EXAM: RIGHT SHOULDER - 2+ VIEW  COMPARISON:  None.  FINDINGS: There is no acute fracture or dislocation. There is moderate osteoarthritis of the right glenohumeral joint. The acromioclavicular joint is normal.  IMPRESSION: No acute osseous injury of the right shoulder. 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   Electronically Signed   By: Elige Ko   On: 07/02/2014 13:42     EKG Interpretation None      MDM   Final diagnoses:  Shoulder strain, right, initial encounter   Patient with probable dislocation of the right shoulder, and is back in place at this time. X-rays unremarkable other than degenerative changes. She is neurovascularly intact. Will place in an immobilizer, instructed to followup with orthopedic specialist. We'll discharge home with Norco for pain.  Filed Vitals:   07/02/14 1301  BP: 139/86  Pulse: 73  Temp: 97.9 F (36.6 C)  TempSrc: Oral  Resp: 16  SpO2: 95%      Shylo Zamor A Yonael Tulloch, PA-C 07/02/14 1628

## 2014-07-02 NOTE — ED Notes (Signed)
Bed: [id number] Expected date: 07/02/14 Expected time: 12:50 PM Means of arrival: Ambulance Comments: Dislocated right shoulder

## 2014-07-05 NOTE — ED Provider Notes (Signed)
Medical screening examination/treatment/procedure(s) were performed by non-physician practitioner and as supervising physician I was immediately available for consultation/collaboration.   EKG Interpretation None       Philis Doke, MD 07/05/14 1416 

## 2014-10-06 ENCOUNTER — Emergency Department (HOSPITAL_COMMUNITY)
Admission: EM | Admit: 2014-10-06 | Discharge: 2014-10-06 | Disposition: A | Discharge status: Home / Self Care | Payer: Self-pay | Attending: Emergency Medicine | Admitting: Emergency Medicine

## 2014-10-06 ENCOUNTER — Emergency Department (HOSPITAL_COMMUNITY): Payer: Self-pay

## 2014-10-06 ENCOUNTER — Encounter (HOSPITAL_COMMUNITY): Payer: Self-pay

## 2014-10-06 DIAGNOSIS — M549 Dorsalgia, unspecified: Secondary | ICD-10-CM | POA: Insufficient documentation

## 2014-10-06 DIAGNOSIS — R079 Chest pain, unspecified: Secondary | ICD-10-CM

## 2014-10-06 DIAGNOSIS — Z8639 Personal history of other endocrine, nutritional and metabolic disease: Secondary | ICD-10-CM | POA: Insufficient documentation

## 2014-10-06 DIAGNOSIS — Z79899 Other long term (current) drug therapy: Secondary | ICD-10-CM | POA: Insufficient documentation

## 2014-10-06 DIAGNOSIS — Z720 Tobacco use: Secondary | ICD-10-CM | POA: Insufficient documentation

## 2014-10-06 DIAGNOSIS — I2510 Atherosclerotic heart disease of native coronary artery without angina pectoris: Secondary | ICD-10-CM | POA: Insufficient documentation

## 2014-10-06 DIAGNOSIS — K219 Gastro-esophageal reflux disease without esophagitis: Secondary | ICD-10-CM | POA: Insufficient documentation

## 2014-10-06 DIAGNOSIS — R110 Nausea: Secondary | ICD-10-CM | POA: Insufficient documentation

## 2014-10-06 DIAGNOSIS — J45901 Unspecified asthma with (acute) exacerbation: Secondary | ICD-10-CM | POA: Insufficient documentation

## 2014-10-06 DIAGNOSIS — Z9851 Tubal ligation status: Secondary | ICD-10-CM | POA: Insufficient documentation

## 2014-10-06 DIAGNOSIS — Z87828 Personal history of other (healed) physical injury and trauma: Secondary | ICD-10-CM | POA: Insufficient documentation

## 2014-10-06 DIAGNOSIS — R0789 Other chest pain: Secondary | ICD-10-CM | POA: Insufficient documentation

## 2014-10-06 LAB — CBC
HCT: 39.6 % (ref 36.0–46.0)
Hemoglobin: 12.9 g/dL (ref 12.0–15.0)
MCH: 29.5 pg (ref 26.0–34.0)
MCHC: 32.6 g/dL (ref 30.0–36.0)
MCV: 90.6 fL (ref 78.0–100.0)
PLATELETS: 272 10*3/uL (ref 150–400)
RBC: 4.37 MIL/uL (ref 3.87–5.11)
RDW: 13.6 % (ref 11.5–15.5)
WBC: 7.7 10*3/uL (ref 4.0–10.5)

## 2014-10-06 LAB — BASIC METABOLIC PANEL
ANION GAP: 13 (ref 5–15)
BUN: 12 mg/dL (ref 6–23)
CHLORIDE: 104 meq/L (ref 96–112)
CO2: 25 meq/L (ref 19–32)
Calcium: 9.1 mg/dL (ref 8.4–10.5)
Creatinine, Ser: 0.69 mg/dL (ref 0.50–1.10)
GFR calc non Af Amer: >90 mL/min (ref >90)
Glucose, Bld: 91 mg/dL (ref 70–99)
POTASSIUM: 3.6 meq/L — AB (ref 3.7–5.3)
SODIUM: 142 meq/L (ref 137–147)

## 2014-10-06 LAB — HEPATIC FUNCTION PANEL
ALBUMIN: 3.5 g/dL (ref 3.5–5.2)
ALT: 17 U/L (ref 0–35)
AST: 18 U/L (ref 0–37)
Alkaline Phosphatase: 74 U/L (ref 39–117)
Bilirubin, Direct: <0.2 mg/dL (ref 0.0–0.3)
TOTAL PROTEIN: 7.2 g/dL (ref 6.0–8.3)
Total Bilirubin: <0.2 mg/dL — ABNORMAL LOW (ref 0.3–1.2)

## 2014-10-06 LAB — I-STAT TROPONIN, ED
TROPONIN I, POC: 0 ng/mL (ref 0.00–0.08)
TROPONIN I, POC: 0 ng/mL (ref 0.00–0.08)

## 2014-10-06 LAB — D-DIMER, QUANTITATIVE (NOT AT ARMC)

## 2014-10-06 MED ORDER — GI COCKTAIL ~~LOC~~
30.0000 mL | Freq: Once | ORAL | Status: AC
  Administered 2014-10-06: 30 mL via ORAL
  Filled 2014-10-06: qty 30

## 2014-10-06 MED ORDER — GUAIFENESIN 100 MG/5ML PO SOLN: 5.0000 mL | Freq: Four times a day (QID) | ORAL | Status: DC | PRN

## 2014-10-06 MED ORDER — CYCLOBENZAPRINE HCL 5 MG PO TABS: 5.0000 mg | ORAL_TABLET | Freq: Three times a day (TID) | ORAL | Status: DC | PRN

## 2014-10-06 MED ORDER — OMEPRAZOLE 20 MG PO CPDR: 20.0000 mg | DELAYED_RELEASE_CAPSULE | Freq: Every day | ORAL | Status: DC

## 2014-10-06 MED ORDER — KETOROLAC TROMETHAMINE 30 MG/ML IJ SOLN
30.0000 mg | Freq: Once | INTRAMUSCULAR | Status: AC
  Administered 2014-10-06: 30 mg via INTRAVENOUS
  Filled 2014-10-06: qty 1

## 2014-10-06 MED ORDER — OMEPRAZOLE 20 MG PO CPDR
20.0000 mg | DELAYED_RELEASE_CAPSULE | Freq: Every day | ORAL | Status: DC
Start: 2014-10-06 — End: 2014-10-06

## 2014-10-06 MED ORDER — SODIUM CHLORIDE 0.9 % IV BOLUS (SEPSIS)
1000.0000 mL | Freq: Once | INTRAVENOUS | Status: AC
  Administered 2014-10-06: 1000 mL via INTRAVENOUS

## 2014-10-06 NOTE — ED Provider Notes (Signed)
CSN: [id number]     Arrival date & time 10/06/14  0915 History   First MD Initiated Contact with Patient 10/06/14 [id number]     Chief Complaint  Patient presents with  . Chest Pain  . Abdominal Pain    HPI Angelica Miller is a 48 year old woman with history of GERD, asthma, post-nasal drip, and chronic bronchitis presenting with chest pain since Sunday. She reports intermittent stabbing substernal chest pain and pain below her left scapula that is worse with ambulation and exertion.  She also reports some chest tightness and shortness of breath that she associates with a flare in her allergies and post-nasal drip.  She has been using her home albuterol inhaler with relief of her symptoms.  She awoke at midnight with stabbing chest pain and gassy abdominal pain, but she was able to go back to sleep until 3 am when she woke again with recurrent pain.  She says that she has taken medications for reflux in the past, but she has been trying to control her symptoms with diet.   Past Medical History  Diagnosis Date  . Coronary artery disease   . Asthma   . Bronchitis   . High cholesterol   . Shoulder dislocation    Past Surgical History  Procedure Laterality Date  . Tubal ligation     No family history on file. History  Substance Use Topics  . Smoking status: Current Some Day Smoker    Types: Cigars  . Smokeless tobacco: Never Used  . Alcohol Use: Yes     Comment: occasionally   OB History    No data available     Review of Systems  Constitutional: Negative for fever, chills, activity change and unexpected weight change.  HENT: Positive for postnasal drip. Negative for congestion, rhinorrhea and sore throat.   Respiratory: Positive for cough, chest tightness and shortness of breath. Negative for wheezing.   Cardiovascular: Positive for chest pain. Negative for palpitations.  Gastrointestinal: Positive for nausea and abdominal pain. Negative for vomiting, diarrhea, constipation, blood  in stool, abdominal distention and anal bleeding.  Genitourinary: Negative for hematuria and difficulty urinating.  Musculoskeletal: Positive for myalgias, back pain and arthralgias.  Skin: Negative for pallor and rash.  Neurological: Negative for dizziness, weakness, numbness and headaches.      Allergies  Review of patient's allergies indicates no known allergies.  Home Medications   Prior to Admission medications   Medication Sig Start Date End Date Taking? Authorizing Provider  albuterol (PROVENTIL HFA;VENTOLIN HFA) 108 (90 BASE) MCG/ACT inhaler Inhale 2 puffs into the lungs every 6 (six) hours as needed for wheezing or shortness of breath.    Historical Provider, MD  diphenhydrAMINE (SOMINEX) 25 MG tablet Take 50 mg by mouth daily.    Historical Provider, MD  guaiFENesin (MUCINEX) 600 MG 12 hr tablet Take 1,200 mg by mouth 2 (two) times daily as needed (sinuses).    Historical Provider, MD   BP 137/85 mmHg  Pulse 72  Temp(Src) 98 F (36.7 C) (Oral)  Resp 18  SpO2 100% Physical Exam  Constitutional: She is oriented to person, place, and time. She appears well-developed and well-nourished. No distress.  HENT:  Head: Normocephalic and atraumatic.  Mouth/Throat: No oropharyngeal exudate.  Eyes: Conjunctivae and EOM are normal. Pupils are equal, round, and reactive to light. No scleral icterus.  Neck: Normal range of motion. Neck supple.  Cardiovascular: Normal rate, regular rhythm and intact distal pulses.  Exam reveals no gallop  and no friction rub.   No murmur heard. Pulmonary/Chest: Effort normal and breath sounds normal. No respiratory distress. She has no wheezes. She exhibits tenderness.  Chest pain reproducible with palpation of sternum and below left clavicle.  Abdominal: Soft. Bowel sounds are normal. She exhibits no distension. There is no tenderness.  Musculoskeletal: Normal range of motion. She exhibits no edema.  Neurological: She is alert and oriented to person,  place, and time. No cranial nerve deficit. She exhibits normal muscle tone.  Skin: Skin is warm and dry. No rash noted. No erythema.    ED Course  Procedures (including critical care time) Labs Review Labs Reviewed  BASIC METABOLIC PANEL - Abnormal; Notable for the following:    Potassium 3.6 (*)    All other components within normal limits  CBC  HEPATIC FUNCTION PANEL  I-STAT TROPOININ, ED    Imaging Review No results found.   EKG Interpretation   Date/Time:  Tuesday October 06 2014 09:17:46 EST Ventricular Rate:  72 PR Interval:  142 QRS Duration: 68 QT Interval:  390 QTC Calculation: 427 R Axis:   41 Text Interpretation:  Normal sinus rhythm Normal ECG No previous ECGs  available Confirmed by YAO  MD, DAVID ([id number]) on 10/06/2014 9:27:30 AM      MDM   Final diagnoses:  Chest pain   10:29 am: Chest pain most likely musculoskeletal given reproducibility on exam, but reflux also possible and will rule out ACS with repeat troponin and EKG.  Low concern for dissection given normotensive and description of pain.  Shortness of breath and chest tightness possibly related to allergies and post-nasal drip with no tachycardia or hypoxia, but will get D-dimer to rule out PE and check chest x-ray. Toradol for chest pain and GI cocktail for possible reflux.  1:25 pm: Chest x-ray and labs including d-dimer and troponin unremarkable.  Patient feeling better symptomatically with resolution of chest pain and improvement of abdominal pain after receiving pain meds and GI cocktail.  Will plan to discharge home with prescription for PPI to treat reflux and flexeril and ibuprofen PRN for musculoskeletal chest pain.  Luisa DagoEverett Bryan Goin, MD 10/06/14 1341  Luisa DagoEverett Renelle Stegenga, MD 10/06/14 1353  Richardean Canalavid H Yao, MD 10/06/14 [phone]

## 2014-10-06 NOTE — Discharge Instructions (Signed)
-  Your chest pain is likely due to muscle strain or spasm.  You can take ibuprofen 400 mg every 6 hours as needed to help with the pain. -I also wrote you a prescription for flexeril, which is a muscle relaxer that you can take up to 3 times per day to help with the pain. -For your cough, you can take Robitussin every 6 hours as needed. -It is likely that your reflux was the cause of your abdominal pain, and it may have contributed to your chest pain as well.  I wrote you a prescription for Prilosec, which should help with your reflux. -[facility] where you can follow up to make sure your pain has resolved.

## 2014-10-06 NOTE — ED Notes (Signed)
Pt states she has been coughing for a few days. Started having chest pain Sunday, intermittently sharp, pt was woken from sleep last night with this pain. Pt denies n/v/diarrhea, pt does state shes short of breath.

## 2014-10-06 NOTE — ED Notes (Signed)
Pt reports she began having epigastric pain along with central CP Sunday.  Pt reports the pain is intermittent.  Pt reports nausea, back pain, and SOB.  Pt alert and oriented and in NAD.

## 2015-02-12 ENCOUNTER — Inpatient Hospital Stay
Admit: 2015-02-12 | Discharge: 2015-02-12 | Disposition: A | Discharge status: Home / Self Care | Payer: Self-pay | Attending: Emergency Medicine

## 2015-02-12 DIAGNOSIS — R51 Headache: Secondary | ICD-10-CM

## 2015-02-12 LAB — CBC WITH AUTOMATED DIFF
ABS. BASOPHILS: 0 10*3/uL (ref 0.0–0.1)
ABS. EOSINOPHILS: 0 10*3/uL (ref 0.0–0.4)
ABS. LYMPHOCYTES: 2.6 10*3/uL (ref 0.9–3.6)
ABS. MONOCYTES: 0.5 10*3/uL (ref 0.05–1.2)
ABS. NEUTROPHILS: 4.4 10*3/uL (ref 1.8–8.0)
BASOPHILS: 0 % (ref 0–2)
EOSINOPHILS: 0 % (ref 0–5)
HCT: 39.9 % (ref 35.0–45.0)
HGB: 13.5 g/dL (ref 12.0–16.0)
LYMPHOCYTES: 35 % (ref 21–52)
MCH: 30.5 PG (ref 24.0–34.0)
MCHC: 33.8 g/dL (ref 31.0–37.0)
MCV: 90.3 FL (ref 74.0–97.0)
MONOCYTES: 7 % (ref 3–10)
MPV: 10.6 FL (ref 9.2–11.8)
NEUTROPHILS: 58 % (ref 40–73)
PLATELET: 267 10*3/uL (ref 135–420)
RBC: 4.42 M/uL (ref 4.20–5.30)
RDW: 13.3 % (ref 11.6–14.5)
WBC: 7.5 10*3/uL (ref 4.6–13.2)

## 2015-02-12 LAB — METABOLIC PANEL, BASIC
Anion gap: NEGATIVE mmol/L (ref 3.0–18)
BUN/Creatinine ratio: 13 (ref 12–20)
BUN: 12 MG/DL (ref 7.0–18)
CO2: 30 mmol/L (ref 21–32)
Calcium: 9 MG/DL (ref 8.5–10.1)
Chloride: 108 mmol/L (ref 100–108)
Creatinine: 0.91 MG/DL (ref 0.6–1.3)
GFR est AA: >60 mL/min/{1.73_m2} (ref >60)
GFR est non-AA: >60 mL/min/{1.73_m2} (ref >60)
Glucose: 88 mg/dL (ref 74–99)
Potassium: 3.6 mmol/L (ref 3.5–5.5)
Sodium: 137 mmol/L (ref 136–145)

## 2015-02-12 MED ORDER — METOCLOPRAMIDE 5 MG/ML IJ SOLN
5 mg/mL | INTRAMUSCULAR | Status: AC
Start: 2015-02-12 — End: 2015-02-12
  Administered 2015-02-12: 22:00:00 via INTRAVENOUS

## 2015-02-12 MED ORDER — DIPHENHYDRAMINE HCL 50 MG/ML IJ SOLN
50 mg/mL | INTRAMUSCULAR | Status: AC
Start: 2015-02-12 — End: 2015-02-12
  Administered 2015-02-12: 22:00:00 via INTRAVENOUS

## 2015-02-12 MED ORDER — ONDANSETRON (PF) 4 MG/2 ML INJECTION
4 mg/2 mL | INTRAMUSCULAR | Status: DC
Start: 2015-02-12 — End: 2015-02-12

## 2015-02-12 MED ORDER — SODIUM CHLORIDE 0.9% BOLUS IV
0.9 % | Freq: Once | INTRAVENOUS | Status: AC
Start: 2015-02-12 — End: 2015-02-12
  Administered 2015-02-12: 22:00:00 via INTRAVENOUS

## 2015-02-12 MED ORDER — KETOROLAC TROMETHAMINE 30 MG/ML INJECTION
30 mg/mL (1 mL) | INTRAMUSCULAR | Status: AC
Start: 2015-02-12 — End: 2015-02-12
  Administered 2015-02-12: 22:00:00 via INTRAVENOUS

## 2015-02-12 MED ORDER — HYDROMORPHONE (PF) 1 MG/ML IJ SOLN
1 mg/mL | INTRAMUSCULAR | Status: DC
Start: 2015-02-12 — End: 2015-02-12

## 2015-02-12 MED FILL — KETOROLAC TROMETHAMINE 30 MG/ML INJECTION: 30 mg/mL (1 mL) | INTRAMUSCULAR | Qty: 1

## 2015-02-12 MED FILL — METOCLOPRAMIDE 5 MG/ML IJ SOLN: 5 mg/mL | INTRAMUSCULAR | Qty: 2

## 2015-02-12 MED FILL — SODIUM CHLORIDE 0.9 % IV: INTRAVENOUS | Qty: 1000

## 2015-02-12 MED FILL — DIPHENHYDRAMINE HCL 50 MG/ML IJ SOLN: 50 mg/mL | INTRAMUSCULAR | Qty: 1

## 2015-02-12 NOTE — ED Notes (Addendum)
Pt.   C/o    Migraine headache (frontal headache)  For 4  Days, some times  with nausea, no different from her previous headache

## 2015-02-12 NOTE — ED Provider Notes (Signed)
HPI Comments: Kimberly RoysKristina L Lopez is a  49 y.o. Normal build african [place] female former smoker h/o Migraines, COPD, CP, HLP, GERD presents to the ED via POV c/o acute onset constant band-like HA, unchanged in quality and severity since onset 5 days ago with associated nausea and photosensitivity.  Describes pain as someone is "knocking on her head."  Pt has taken Excedrin Migraine with no relief. Reports symptoms are similar to prior episodes, however, remains constant even with medication and sleep.  Reports nausea, photosensitivity, blurred vision and unsteady gait.  Denies f/c, head trauma, syncope, seizures, numbness, facial asymmetry, dysphagia, speech difficulty, weakness, cp, sob, vomiting, diarrhea, dysuria, back pain, frequency/urgency of urination and rashes.  Last EtOH 6 days ago, 2 liquor drinks.            Patient is a 49 y.o. female presenting with headaches and nausea.   Headache  Associated symptoms include headaches. Pertinent negatives include no chest pain, no abdominal pain and no shortness of breath.   Nausea   Associated symptoms include headaches and headaches. Pertinent negatives include no chills, no fever, no abdominal pain, no diarrhea, no arthralgias and no cough.        Past Medical History:   Diagnosis Date   ??? Migraine    ??? Chest pain, unspecified    ??? Shortness of breath    ??? Obesity, unspecified    ??? Other second degree atrioventricular block    ??? Hyperlipidemia    ??? COPD (chronic obstructive pulmonary disease) (HCC)    ??? GERD (gastroesophageal reflux disease)        Past Surgical History:   Procedure Laterality Date   ??? Hx gyn     ??? Hx tubal ligation           Family History:   Problem Relation Age of Onset   ??? Cancer Other    ??? Diabetes Other    ??? Heart Disease Other    ??? Hypertension Other    ??? Stroke Other        History     Social History   ??? Marital Status: SINGLE     Spouse Name: N/A   ??? Number of Children: N/A   ??? Years of Education: N/A     Occupational History    ??? Not on file.     Social History Main Topics   ??? Smoking status: Former Smoker   ??? Smokeless tobacco: Never Used   ??? Alcohol Use: Yes      Comment: rarely   ??? Drug Use: No   ??? Sexual Activity: Not on file     Other Topics Concern   ??? Not on file     Social History Narrative           ALLERGIES: Review of patient's allergies indicates no known allergies.      Review of Systems   Constitutional: Negative for fever and chills.   HENT: Negative for ear pain and sore throat.    Eyes: Positive for photophobia. Negative for pain and visual disturbance.   Respiratory: Negative for cough and shortness of breath.    Cardiovascular: Negative for chest pain and palpitations.   Gastrointestinal: Positive for nausea. Negative for vomiting, abdominal pain and diarrhea.   Genitourinary: Negative for dysuria, urgency and frequency.   Musculoskeletal: Negative for joint swelling, arthralgias, neck pain and neck stiffness.   Skin: Negative for color change, pallor, rash and wound.   Neurological: Positive for headaches.  Negative for dizziness, seizures, syncope, facial asymmetry, speech difficulty, weakness, light-headedness and numbness.   Psychiatric/Behavioral: Negative for behavioral problems. The patient is not nervous/anxious.        Filed Vitals:    02/12/15 1513 02/12/15 1627   BP: 133/81 110/61   Pulse: 87 68   Temp: 98.7 ??F (37.1 ??C) 98.4 ??F (36.9 ??C)   Resp: 16 16   Height:  (1.575 m)    Weight: 78.926 kg (174 lb)    SpO2: 97% 99%            Physical Exam   Constitutional: She is oriented to person, place, and time. She appears well-developed and well-nourished. No distress.   HENT:   Head: Normocephalic and atraumatic.   Right Ear: Hearing, tympanic membrane, external ear and ear canal normal.   Left Ear: Hearing, tympanic membrane, external ear and ear canal normal.   Nose: Nose normal. No mucosal edema or rhinorrhea.   Mouth/Throat: Uvula is midline, oropharynx is clear and moist and mucous  membranes are normal. No uvula swelling. No oropharyngeal exudate, posterior oropharyngeal edema, posterior oropharyngeal erythema or tonsillar abscesses.   Eyes: Conjunctivae and EOM are normal. Pupils are equal, round, and reactive to light. Right eye exhibits no discharge. Left eye exhibits no discharge.   Neck: Trachea normal, normal range of motion, full passive range of motion without pain and phonation normal. Neck supple. No tracheal tenderness, no spinous process tenderness and no muscular tenderness present. No rigidity. No tracheal deviation, no edema, no erythema and normal range of motion present. No Brudzinski's sign and no Kernig's sign noted. No thyroid mass and no thyromegaly present.   Cardiovascular: Normal rate, regular rhythm and normal heart sounds.  Exam reveals no gallop and no friction rub.    No murmur heard.  Pulmonary/Chest: Effort normal and breath sounds normal. No accessory muscle usage or stridor. No tachypnea. No respiratory distress. She has no decreased breath sounds. She has no wheezes. She has no rhonchi. She has no rales.   Abdominal: Soft. Bowel sounds are normal. There is no tenderness. There is no rigidity, no rebound, no guarding, no CVA tenderness, no tenderness at McBurney's point and negative Murphy's sign.   Lymphadenopathy:     She has no cervical adenopathy.   Neurological: She is alert and oriented to person, place, and time. She has normal strength and normal reflexes. She is not disoriented. No cranial nerve deficit or sensory deficit.   No focal neuro deficits. MS 5/5 throughout. Gait is normal. Speech is clear.    Skin: Skin is warm and dry. She is not diaphoretic.   Psychiatric: She has a normal mood and affect. Her behavior is normal.   Nursing note and vitals reviewed.       MDM  Number of Diagnoses or Management Options  Headache, unspecified headache type: new and requires workup  Diagnosis management comments: DDX: Migraine, Tension, Cluster, ICH,  Cervical sprain, Cervical strain, Skull Fracture, CVA, TIA, Concussion with or without LOC, CHI, Skull Contusion, Neoplasm    Afebrile, No focal neuro deficits. MS 5/5 throughout.     6:22 PM: HA was 8/10 now 6/10 following Toradol, Reglan, Benadryl. Will give Dilaudid 1 mg IV.     6:31 PM: Continuing to improve.     Reviewed workup results, any meds, and discharge instructions OR admission plan with patient and any family present.  Answered all questions. Michaelyn Barter, PA  6:32 PM    PLEASE FOLLOW-UP  AS DIRECTED WITHOUT FAIL WITHIN THE TIME FRAME RECOMMENDED AS FAILURE TO DO SO COULD RESULT IN WORSENING OF YOUR PHYSICAL CONDITION, DEATH, AND OR PERMANENT DISABILITY.     RETURN TO THE EMERGENCY DEPARTMENT AT IF YOU ARE UNABLE TO FOLLOW-UP AS DIRECTED.     RETURN TO THE EMERGENCY DEPARTMENT AT ONCE IF YOU HAVE SYMPTOMS THAT DO NOT IMPROVE WITH TREATMENT, NEW SYMPTOMS, WORSENING SYMPTOMS, OR ANY OTHER CONCERNS.     THE PATIENT AGREES WITH THE DISCHARGE PLAN AND FOLLOW-UP INSTRUCTIONS. THE PATIENT AGREES TO REVIEW ALL HANDOUTS.          Amount and/or Complexity of Data Reviewed  Clinical lab tests: ordered and reviewed  Tests in the medicine section of CPT??: reviewed and ordered  Decide to obtain previous medical records or to obtain history from someone other than the patient: yes  Review and summarize past medical records: yes  Independent visualization of images, tracings, or specimens: yes    Risk of Complications, Morbidity, and/or Mortality  Presenting problems: moderate  Diagnostic procedures: moderate  Management options: moderate    Patient Progress  Patient progress: stable      Procedures    Diagnosis:   1. Headache, unspecified headache type          Disposition: HOME    Follow-up Information     Follow up With Details Comments Contact Info    [facility] EMERGENCY DEPT  As needed, If symptoms worsen [address] [address]  [phone]    Antionette Poles, MD In 3 days  [address] [facility]   Family Medicine [facility]  [place] [address]  [phone]            Patient's Medications   Start Taking    No medications on file   Continue Taking    No medications on file   These Medications have changed    No medications on file   Stop Taking    No medications on file       Recent Results (from the past 24 hour(s))   CBC WITH AUTOMATED DIFF    Collection Time: 02/12/15  5:13 PM   Result Value Ref Range    WBC 7.5 4.6 - 13.2 K/uL    RBC 4.42 4.20 - 5.30 M/uL    HGB 13.5 12.0 - 16.0 g/dL    HCT 41.3 24.4 - 01.0 %    MCV 90.3 74.0 - 97.0 FL    MCH 30.5 24.0 - 34.0 PG    MCHC 33.8 31.0 - 37.0 g/dL    RDW 27.2 53.6 - 64.4 %    PLATELET 267 135 - 420 K/uL    MPV 10.6 9.2 - 11.8 FL    NEUTROPHILS 58 40 - 73 %    LYMPHOCYTES 35 21 - 52 %    MONOCYTES 7 3 - 10 %    EOSINOPHILS 0 0 - 5 %    BASOPHILS 0 0 - 2 %    ABS. NEUTROPHILS 4.4 1.8 - 8.0 K/UL    ABS. LYMPHOCYTES 2.6 0.9 - 3.6 K/UL    ABS. MONOCYTES 0.5 0.05 - 1.2 K/UL    ABS. EOSINOPHILS 0.0 0.0 - 0.4 K/UL    ABS. BASOPHILS 0.0 0.0 - 0.1 K/UL    DF AUTOMATED     METABOLIC PANEL, BASIC    Collection Time: 02/12/15  5:13 PM   Result Value Ref Range    Sodium 137 136 - 145 mmol/L  Potassium 3.6 3.5 - 5.5 mmol/L    Chloride 108 100 - 108 mmol/L    CO2 30 21 - 32 mmol/L    Anion gap NEG 1 3.0 - 18 mmol/L    Glucose 88 74 - 99 mg/dL    BUN 12 7.0 - 18 MG/DL    Creatinine 1.61 0.6 - 1.3 MG/DL    BUN/Creatinine ratio 13 12 - 20      GFR est AA >60 >60 ml/min/1.18m2    GFR est non-AA >60 >60 ml/min/1.83m2    Calcium 9.0 8.5 - 10.1 MG/DL

## 2015-02-12 NOTE — ED Notes (Signed)
Discharge instructions given to patient. Verbalizes understanding.

## 2015-02-28 ENCOUNTER — Emergency Department: Admit: 2015-02-28 | Payer: Self-pay | Primary: Family Medicine

## 2015-02-28 ENCOUNTER — Inpatient Hospital Stay
Admit: 2015-02-28 | Discharge: 2015-02-28 | Disposition: A | Discharge status: Home / Self Care | Payer: Self-pay | Attending: Emergency Medicine

## 2015-02-28 DIAGNOSIS — J4521 Mild intermittent asthma with (acute) exacerbation: Secondary | ICD-10-CM

## 2015-02-28 LAB — INFLUENZA A & B AG (RAPID TEST)
Influenza A Antigen: NEGATIVE
Influenza B Antigen: NEGATIVE

## 2015-02-28 MED ORDER — AZITHROMYCIN 250 MG TAB
250 mg | ORAL | Status: AC
Start: 2015-02-28 — End: 2015-02-28
  Administered 2015-02-28: 18:00:00 via ORAL

## 2015-02-28 MED ORDER — IBUPROFEN 400 MG TAB
400 mg | ORAL | Status: AC
Start: 2015-02-28 — End: 2015-02-28
  Administered 2015-02-28: 18:00:00 via ORAL

## 2015-02-28 MED ORDER — AZITHROMYCIN 250 MG TAB
250 mg | ORAL_TABLET | ORAL | Status: DC
Start: 2015-02-28 — End: 2015-06-17

## 2015-02-28 MED ORDER — ACETAMINOPHEN 325 MG TABLET
325 mg | ORAL | Status: AC
Start: 2015-02-28 — End: 2015-02-28
  Administered 2015-02-28: 18:00:00 via ORAL

## 2015-02-28 MED ORDER — PREDNISONE 10 MG TABLETS IN A DOSE PACK
10 mg | ORAL_TABLET | ORAL | Status: DC
Start: 2015-02-28 — End: 2015-06-17

## 2015-02-28 MED ORDER — METHYLPREDNISOLONE (PF) 125 MG/2 ML IJ SOLR
125 mg/2 mL | INTRAMUSCULAR | Status: AC
Start: 2015-02-28 — End: 2015-02-28
  Administered 2015-02-28: 18:00:00 via INTRAVENOUS

## 2015-02-28 MED ORDER — SODIUM CHLORIDE 0.9% BOLUS IV
0.9 % | Freq: Once | INTRAVENOUS | Status: AC
Start: 2015-02-28 — End: 2015-02-28
  Administered 2015-02-28: 18:00:00 via INTRAVENOUS

## 2015-02-28 MED ORDER — ALBUTEROL SULFATE HFA 90 MCG/ACTUATION AEROSOL INHALER
90 mcg/actuation | RESPIRATORY_TRACT | Status: DC | PRN
Start: 2015-02-28 — End: 2015-06-17

## 2015-02-28 MED ORDER — IPRATROPIUM-ALBUTEROL 2.5 MG-0.5 MG/3 ML NEB SOLUTION
2.5 mg-0.5 mg/3 ml | Freq: Once | RESPIRATORY_TRACT | Status: AC
Start: 2015-02-28 — End: 2015-02-28
  Administered 2015-02-28: 18:00:00 via RESPIRATORY_TRACT

## 2015-02-28 MED FILL — IPRATROPIUM-ALBUTEROL 2.5 MG-0.5 MG/3 ML NEB SOLUTION: 2.5 mg-0.5 mg/3 ml | RESPIRATORY_TRACT | Qty: 3

## 2015-02-28 MED FILL — TYLENOL 325 MG TABLET: 325 mg | ORAL | Qty: 3

## 2015-02-28 MED FILL — IBUPROFEN 400 MG TAB: 400 mg | ORAL | Qty: 2

## 2015-02-28 MED FILL — AZITHROMYCIN 250 MG TAB: 250 mg | ORAL | Qty: 2

## 2015-02-28 MED FILL — SODIUM CHLORIDE 0.9 % IV: INTRAVENOUS | Qty: 1000

## 2015-02-28 MED FILL — SOLU-MEDROL (PF) 125 MG/2 ML SOLUTION FOR INJECTION: 125 mg/2 mL | INTRAMUSCULAR | Qty: 2

## 2015-02-28 NOTE — ED Notes (Signed)
Discharge instructions reviewed with patient.  Patient voices understanding.  Patient advised to follow up as directed on discharge instructions.  Patient denies questions, needs or concerns at discharge regarding discharge instructions. Advised patient of need to update demographic information with registration prior to departing ER.  Patient voiced understanding.   No s/sx of apparent distress noted.  Armband removed and placed in shredder box.

## 2015-02-28 NOTE — ED Provider Notes (Signed)
HPI   1:41 PM Kimberly Lopez is a 49 y.o. female with h/o COPD, GERD, and obesity who presents to ED complaining of cough starting yesterday.  Pt started running a fever around 8pm last night.  Pt reports having some chest congestion and generalized myalgias.  Pt reports using her nebulizer at home with no relief.  Pt has a history of bronchitis.  Pt denies being around anyone who is sick.  Pt is a smoker.  Pt denies N/V/D.      PCP: Antionette Poles, MD    Past Medical History:   Diagnosis Date   ??? Migraine    ??? Chest pain, unspecified    ??? Shortness of breath    ??? Obesity, unspecified    ??? Other second degree atrioventricular block    ??? Hyperlipidemia    ??? COPD (chronic obstructive pulmonary disease) (HCC)    ??? GERD (gastroesophageal reflux disease)        Past Surgical History:   Procedure Laterality Date   ??? Hx gyn     ??? Hx tubal ligation           Family History:   Problem Relation Age of Onset   ??? Cancer Other    ??? Diabetes Other    ??? Heart Disease Other    ??? Hypertension Other    ??? Stroke Other        History     Social History   ??? Marital Status: SINGLE     Spouse Name: N/A   ??? Number of Children: N/A   ??? Years of Education: N/A     Occupational History   ??? Not on file.     Social History Main Topics   ??? Smoking status: Current Some Day Smoker   ??? Smokeless tobacco: Never Used   ??? Alcohol Use: Yes      Comment: rarely   ??? Drug Use: No   ??? Sexual Activity: Not on file     Other Topics Concern   ??? Not on file     Social History Narrative       ALLERGIES: Review of patient's allergies indicates no known allergies.      Review of Systems  Constitutional:  See HPI.   Head:  Denies injury.   Face:  Denies injury or pain.   ENMT:  Denies sore throat.   Neck:  Denies injury or pain.   Chest:  Denies injury.   Cardiac:  Denies chest pain or palpitations.   Respiratory:  See HPI.  GI/ABD:  Denies injury, pain, distention, nausea, vomiting, diarrhea.   GU:  Denies injury, pain, dysuria or urgency.    Back:  Denies injury or pain.   Pelvis:  Denies injury or pain.   Extremity/MS:  See HPI.   Neuro:  Denies headache, LOC, dizziness, neurologic symptoms/deficits/paresthesias.   Skin: Denies injury, rash, itching or skin changes.    Filed Vitals:    02/28/15 1328   BP: 121/70   Pulse: 110   Temp: 102.3 ??F (39.1 ??C)   Resp: 20   Height:  (1.575 m)   Weight: 79.379 kg (175 lb)   SpO2: 100%            Physical Exam   CONSTITUTIONAL: Alert, in no apparent distress; well-developed; well-nourished.   HEAD:  Normocephalic, atraumatic.   EYES: PERRL; EOM's intact.   ENTM: Nose: no rhinorrhea; Throat: mucous membranes moist. TMs-normal bilaterally. Posterior pharynx-normal.  Neck:  No JVD, supple without lymphadenopathy.  RESP: Wheezy cough.  CV: S1 and S2 WNL; No murmurs, gallops or rubs.   GI: Abdomen soft and non-tender. No masses or organomegaly.   UPPER EXT:  Normal inspection.   LOWER EXT: No edema.   NEURO: CN II-XII intact, strength 5/5 and sym, sensation intact.   SKIN: No rashes; Normal for age and stage.   PSYCH:  Alert and oriented, normal affect.      MDM  Number of Diagnoses or Management Options  Diagnosis management comments: Cough and fever and wheeze-pneumonia and flu and strep and bronchitis       Amount and/or Complexity of Data Reviewed  Clinical lab tests: ordered and reviewed  Tests in the radiology section of CPT??: ordered and reviewed  Review and summarize past medical records: yes  Independent visualization of images, tracings, or specimens: yes    Risk of Complications, Morbidity, and/or Mortality  Presenting problems: moderate  Diagnostic procedures: moderate  Management options: moderate    Patient Progress  Patient progress: improved      Procedures    Medications ordered:   Medications   sodium chloride 0.9 % bolus infusion 1,000 mL (1,000 mL IntraVENous New Bag 02/28/15 1411)   acetaminophen (TYLENOL) tablet 975 mg (975 mg Oral Given 02/28/15 1358)    ibuprofen (MOTRIN) tablet 800 mg (800 mg Oral Given 02/28/15 1358)   albuterol-ipratropium (DUO-NEB) 2.5 MG-0.5 MG/3 ML (3 mL Nebulization Given 02/28/15 1359)   methylPREDNISolone (PF) (SOLU-MEDROL) injection 125 mg (125 mg IntraVENous Given 02/28/15 1411)   azithromycin (ZITHROMAX) tablet 500 mg (500 mg Oral Given 02/28/15 1358)         Lab findings:  Labs Reviewed   STREP THROAT SCREEN   INFLUENZA A & B AG (RAPID TEST)       EKG interpretation:    X-Ray, CT or other radiology findings or impressions:  XR CHEST PA LAT    (Preliminary Results)   2:40 PM No acute disease.  Interpreted by Sharilyn SitesWilbur E Shakeem Stern, MD    Progress notes, Consult notes or additional Procedure notes:     Reevaluation of patient:   2:42 PM I have discussed all results with pt.    I have reevaluated patient. Patient is feeling better    Dispo:  Patient was discharged in stable condition.  Patient is to return to emergency department with any new or worsening condition.        Scribe Attestation  Echo Estral BeachRiggs acting as a Neurosurgeonscribe for and in the presence of Sharilyn SitesWilbur E Ivanka Kirshner, MD  February 28, 2015 at 2:40 PM       Provider Attestation:  I personally performed the services described in the documentation, reviewed the documentation, as recorded by the scribe in my presence, and it accurately and completely records my words and actions.  Sharilyn SitesWilbur E Yuliana Vandrunen, MD

## 2015-02-28 NOTE — ED Notes (Signed)
Pt presents with c/o cough, chest congestion. Fever of 103 and generalized body aches since yesterday. Pt reports hx of bronchitis, using nebulizer treatments without relief. Denies n/v/d

## 2015-02-28 NOTE — ED Notes (Signed)
MD aware of continued pulse elevation of 110-115.  Per MD okay to discharge to home, cites fever as etiology for pulse elevation.  Discharge instructions reviewed with patient.  Patient voices understanding.  Patient advised to follow up as directed on discharge instructions.  Patient denies questions, needs or concerns at discharge regarding discharge instructions. Advised patient of need to update demographic information with registration prior to departing ER.  Patient voiced understanding.   No s/sx of apparent distress noted.  Armband removed and placed in shredder box.

## 2015-03-02 LAB — STREP THROAT SCREEN: Strep Screen: NEGATIVE

## 2015-06-17 ENCOUNTER — Inpatient Hospital Stay
Admit: 2015-06-17 | Discharge: 2015-06-17 | Disposition: A | Discharge status: Home / Self Care | Payer: Self-pay | Attending: Emergency Medicine

## 2015-06-17 ENCOUNTER — Emergency Department: Admit: 2015-06-17 | Payer: Self-pay | Primary: Family Medicine

## 2015-06-17 DIAGNOSIS — J441 Chronic obstructive pulmonary disease with (acute) exacerbation: Secondary | ICD-10-CM

## 2015-06-17 LAB — EKG, 12 LEAD, INITIAL
Atrial Rate: 57 {beats}/min
Calculated P Axis: 57 degrees
Calculated R Axis: 4 degrees
Calculated T Axis: 2 degrees
P-R Interval: 186 ms
Q-T Interval: 444 ms
QRS Duration: 78 ms
QTC Calculation (Bezet): 432 ms
Ventricular Rate: 57 {beats}/min

## 2015-06-17 LAB — CBC WITH AUTOMATED DIFF
ABS. BASOPHILS: 0 10*3/uL (ref 0.0–0.06)
ABS. EOSINOPHILS: 0 10*3/uL (ref 0.0–0.4)
ABS. LYMPHOCYTES: 2.7 10*3/uL (ref 0.9–3.6)
ABS. MONOCYTES: 0.5 10*3/uL (ref 0.05–1.2)
ABS. NEUTROPHILS: 3.9 10*3/uL (ref 1.8–8.0)
BASOPHILS: 0 % (ref 0–2)
EOSINOPHILS: 0 % (ref 0–5)
HCT: 40.4 % (ref 35.0–45.0)
HGB: 13.5 g/dL (ref 12.0–16.0)
LYMPHOCYTES: 38 % (ref 21–52)
MCH: 30.4 PG (ref 24.0–34.0)
MCHC: 33.4 g/dL (ref 31.0–37.0)
MCV: 91 FL (ref 74.0–97.0)
MONOCYTES: 7 % (ref 3–10)
MPV: 10.5 FL (ref 9.2–11.8)
NEUTROPHILS: 55 % (ref 40–73)
PLATELET: 265 10*3/uL (ref 135–420)
RBC: 4.44 M/uL (ref 4.20–5.30)
RDW: 13.2 % (ref 11.6–14.5)
WBC: 7.2 10*3/uL (ref 4.6–13.2)

## 2015-06-17 LAB — METABOLIC PANEL, BASIC
Anion gap: 7 mmol/L (ref 3.0–18)
BUN/Creatinine ratio: 22 — ABNORMAL HIGH (ref 12–20)
BUN: 13 MG/DL (ref 7.0–18)
CO2: 26 mmol/L (ref 21–32)
Calcium: 8.7 MG/DL (ref 8.5–10.1)
Chloride: 110 mmol/L — ABNORMAL HIGH (ref 100–108)
Creatinine: 0.6 MG/DL (ref 0.6–1.3)
GFR est AA: >60 mL/min/{1.73_m2} (ref >60)
GFR est non-AA: >60 mL/min/{1.73_m2} (ref >60)
Glucose: 94 mg/dL (ref 74–99)
Potassium: 4.3 mmol/L (ref 3.5–5.5)
Sodium: 143 mmol/L (ref 136–145)

## 2015-06-17 LAB — CARDIAC PANEL,(CK, CKMB & TROPONIN)
CK - MB: 1.2 ng/ml (ref 0.5–3.6)
CK-MB Index: 0.5 % (ref 0.0–4.0)
CK: 249 U/L — ABNORMAL HIGH (ref 26–192)
Troponin-I, QT: <0.02 NG/ML (ref 0.0–0.045)

## 2015-06-17 MED ORDER — PREDNISONE 20 MG TAB
20 mg | ORAL | Status: AC
Start: 2015-06-17 — End: 2015-06-17
  Administered 2015-06-17: 13:00:00 via ORAL

## 2015-06-17 MED ORDER — ALBUTEROL SULFATE HFA 90 MCG/ACTUATION AEROSOL INHALER
90 mcg/actuation | RESPIRATORY_TRACT | Status: DC | PRN
Start: 2015-06-17 — End: 2018-08-22

## 2015-06-17 MED ORDER — PREDNISONE 50 MG TAB
50 mg | ORAL_TABLET | Freq: Every day | ORAL | Status: AC
Start: 2015-06-17 — End: 2015-06-20

## 2015-06-17 MED ORDER — IPRATROPIUM-ALBUTEROL 2.5 MG-0.5 MG/3 ML NEB SOLUTION
2.5 mg-0.5 mg/3 ml | RESPIRATORY_TRACT | Status: AC
Start: 2015-06-17 — End: 2015-06-17
  Administered 2015-06-17: 13:00:00 via RESPIRATORY_TRACT

## 2015-06-17 MED ORDER — ALBUTEROL SULFATE HFA 90 MCG/ACTUATION AEROSOL INHALER
90 mcg/actuation | RESPIRATORY_TRACT | Status: DC | PRN
Start: 2015-06-17 — End: 2015-06-17

## 2015-06-17 MED FILL — PREDNISONE 20 MG TAB: 20 mg | ORAL | Qty: 3

## 2015-06-17 MED FILL — IPRATROPIUM-ALBUTEROL 2.5 MG-0.5 MG/3 ML NEB SOLUTION: 2.5 mg-0.5 mg/3 ml | RESPIRATORY_TRACT | Qty: 3

## 2015-06-17 NOTE — ED Notes (Signed)
I have reviewed discharge instructions with the patient.  The patient verbalized understanding.    Pt evaluated by provider

## 2015-06-17 NOTE — ED Notes (Signed)
Patient complaining of chest tightness, states she has chronic bronchitis. Denies chest pain, states she slightly SOB.

## 2015-06-17 NOTE — ED Provider Notes (Signed)
HPI Comments: 7:38 AM Kimberly Lopez is a 49 y.o. female with a history of COPD, GERD who presents to the ED with chief complaint of SOB x 2 days. States she used her inhaler yesterday and this morning however continued to have tightness and dyspnea therefore she came in.     She denies fever, N/V, CP, or change in her chronic cough.  Patient is not currently on steroids. 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              The history is provided by the patient.        Past Medical History:   Diagnosis Date   ??? Migraine    ??? Chest pain, unspecified    ??? Shortness of breath    ??? Obesity, unspecified    ??? Other second degree atrioventricular block    ??? Hyperlipidemia    ??? COPD (chronic obstructive pulmonary disease) (HCC)    ??? GERD (gastroesophageal reflux disease)        Past Surgical History:   Procedure Laterality Date   ??? Hx gyn     ??? Hx tubal ligation           Family History:   Problem Relation Age of Onset   ??? Cancer Other    ??? Diabetes Other    ??? Heart Disease Other    ??? Hypertension Other    ??? Stroke Other        History     Social History   ??? Marital Status: SINGLE     Spouse Name: N/A   ??? Number of Children: N/A   ??? Years of Education: N/A     Occupational History   ??? Not on file.     Social History Main Topics   ??? Smoking status: Current Some Day Smoker   ??? Smokeless tobacco: Never Used   ??? Alcohol Use: Yes      Comment: rarely   ??? Drug Use: No   ??? Sexual Activity: Not on file     Other Topics Concern   ??? Not on file     Social History Narrative         ALLERGIES: Review of patient's allergies indicates no known allergies.    Review of Systems   Constitutional: Negative for fever and chills.   HENT: Negative for congestion and rhinorrhea.    Respiratory: Positive for shortness of breath.    Cardiovascular: Negative for chest pain and leg swelling.    Gastrointestinal: Negative for nausea, vomiting, abdominal pain and diarrhea.   Genitourinary: Negative for dysuria and frequency.   Musculoskeletal: Negative for myalgias, back pain and arthralgias.   Skin: Negative for rash and wound.   Neurological: Negative for dizziness, numbness and headaches.   All other systems reviewed and are negative.      Filed Vitals:    06/17/15 0714   BP: 135/80   Pulse: 75   Temp: 98.3 ??F (36.8 ??C)   Resp: 18   Height: 5\' 2"  (1.575 m)   Weight: 81.194 kg (179 lb)   SpO2: 97%            Physical Exam   Constitutional: She is oriented to person, place, and time. No distress.   HENT:   Head: Atraumatic.   Eyes: Conjunctivae are normal.   Neck: Normal range of motion. Neck supple.  Cardiovascular: Normal rate, regular rhythm and normal heart sounds.    Pulmonary/Chest: Effort normal.   Expiratory wheezing bilaterally   Abdominal: Soft. Bowel sounds are normal. She exhibits no distension. There is no tenderness. There is no rebound and no guarding.   Musculoskeletal: Normal range of motion. She exhibits no edema or tenderness.   Neurological: She is alert and oriented to person, place, and time. No cranial nerve deficit.   Skin: Skin is warm and dry.   Psychiatric: She has a normal mood and affect.   Nursing note and vitals reviewed.       MDM  Number of Diagnoses or Management Options  Acute exacerbation of chronic obstructive pulmonary disease (COPD) (HCC):   Diagnosis management comments: Kimberly Lopez is a 49 y.o. female presenting with sob and wheezing. No distress. Vitals and exam unremarkable. Will rule out ACS or pna and give duoneb. Will reassess after treatment. History and exam not c/w aortic pathology or PE. No change in cough c/w acute  bronchitis.       Procedures    Vitals:  Patient Vitals for the past 12 hrs:   Temp Pulse Resp BP SpO2   06/17/15 0714 98.3 ??F (36.8 ??C) 75 18 135/80 mmHg 97 %           EKG interpretation by ED Physician:   Sinus bradycardia with a rate of 57; No STEMI interpreted by Gustavo LahEmily L Clyda Smyth, MD at 8:13 AM           X-Ray, CT or other radiology findings or impressions:  XR CHEST SNGL V   Final Result   IMPRESSION:  ??  Mild hypoventilation of lungs.  ??  Otherwise lungs are clear without any diagnostic finding.  ??  Stable normal cardiac size, without evidence of cardiac decompensation.           Progress notes, Consult notes or additional Procedure notes:   I have discussed results of work up with patient.  She endorses improvement in breathing and is requesting discharge. Will give life coach information to assist her with arranging pcp. Return precautions discussed. Patient stated verbal understanding and agrees with course and plan.         Disposition:  Diagnosis:   1. Acute exacerbation of chronic obstructive pulmonary disease (COPD) (HCC)        Disposition: Discharged home in stable condition             Scribe Attestation  Regina EckKierra Barnes scribing for and in the presence of Gustavo LahEmily L Renesmay Nesbitt, MD (06/17/2015 / 7:38 AM)      Physician Attestation    I personally performed the services described in this documentation, reviewed and edited the documentation which was dictated to the scribe in my presence, and it accurately records my words and actions.    Gustavo LahEmily L Rexann Lueras, MD

## 2015-08-03 ENCOUNTER — Inpatient Hospital Stay
Admit: 2015-08-03 | Discharge: 2015-08-03 | Disposition: A | Discharge status: Home / Self Care | Payer: Self-pay | Attending: Emergency Medicine

## 2015-08-03 ENCOUNTER — Emergency Department: Admit: 2015-08-03 | Payer: Self-pay | Primary: Family Medicine

## 2015-08-03 DIAGNOSIS — R05 Cough: Secondary | ICD-10-CM

## 2015-08-03 LAB — METABOLIC PANEL, COMPREHENSIVE
A-G Ratio: 0.9 (ref 0.8–1.7)
ALT (SGPT): 20 U/L (ref 13–56)
AST (SGOT): 18 U/L (ref 15–37)
Albumin: 3.4 g/dL (ref 3.4–5.0)
Alk. phosphatase: 89 U/L (ref 45–117)
Anion gap: 6 mmol/L (ref 3.0–18)
BUN/Creatinine ratio: 12 (ref 12–20)
BUN: 8 MG/DL (ref 7.0–18)
Bilirubin, total: 0.3 MG/DL (ref 0.2–1.0)
CO2: 24 mmol/L (ref 21–32)
Calcium: 8.3 MG/DL — ABNORMAL LOW (ref 8.5–10.1)
Chloride: 107 mmol/L (ref 100–108)
Creatinine: 0.65 MG/DL (ref 0.6–1.3)
GFR est AA: >60 mL/min/{1.73_m2} (ref >60)
GFR est non-AA: >60 mL/min/{1.73_m2} (ref >60)
Globulin: 4 g/dL (ref 2.0–4.0)
Glucose: 89 mg/dL (ref 74–99)
Potassium: 3.8 mmol/L (ref 3.5–5.5)
Protein, total: 7.4 g/dL (ref 6.4–8.2)
Sodium: 137 mmol/L (ref 136–145)

## 2015-08-03 LAB — CBC WITH AUTOMATED DIFF
ABS. BASOPHILS: 0 10*3/uL (ref 0.0–0.1)
ABS. EOSINOPHILS: 0.1 10*3/uL (ref 0.0–0.4)
ABS. LYMPHOCYTES: 1.7 10*3/uL (ref 0.9–3.6)
ABS. MONOCYTES: 0.7 10*3/uL (ref 0.05–1.2)
ABS. NEUTROPHILS: 5.2 10*3/uL (ref 1.8–8.0)
BASOPHILS: 0 % (ref 0–2)
EOSINOPHILS: 1 % (ref 0–5)
HCT: 40.9 % (ref 35.0–45.0)
HGB: 14 g/dL (ref 12.0–16.0)
LYMPHOCYTES: 21 % (ref 21–52)
MCH: 30.8 PG (ref 24.0–34.0)
MCHC: 34.2 g/dL (ref 31.0–37.0)
MCV: 89.9 FL (ref 74.0–97.0)
MONOCYTES: 10 % (ref 3–10)
MPV: 10.1 FL (ref 9.2–11.8)
NEUTROPHILS: 68 % (ref 40–73)
PLATELET: 276 10*3/uL (ref 135–420)
RBC: 4.55 M/uL (ref 4.20–5.30)
RDW: 14 % (ref 11.6–14.5)
WBC: 7.7 10*3/uL (ref 4.6–13.2)

## 2015-08-03 LAB — HCG URINE, QL: HCG urine, QL: NEGATIVE

## 2015-08-03 MED ORDER — HYDROCODONE 10 MG-CHLORPHENIRAMINE 8 MG/5 ML ORAL SUSP EXTEND.REL 12HR
10-8 mg/5 mL | Freq: Two times a day (BID) | ORAL | 0 refills | Status: DC | PRN
Start: 2015-08-03 — End: 2018-08-22

## 2015-08-03 NOTE — ED Provider Notes (Addendum)
HPI Comments: 9:15 AM Kimberly Lopez is a 49 y.o. female with a hx of chronic bronchitis who presents to the ED c/o cough for the past 4 days. She notes an associated low grade fever of 100F that was noted 2 days ago. The pt denies a hx of asthma or COPD. She has no further complaints. No other aggravating or alleviating factors. No other associated symptoms.         The history is provided by the patient.        Past Medical History:   Diagnosis Date   ??? Chest pain, unspecified    ??? COPD (chronic obstructive pulmonary disease) (HCC)    ??? GERD (gastroesophageal reflux disease)    ??? Hyperlipidemia    ??? Migraine    ??? Obesity, unspecified    ??? Other second degree atrioventricular block    ??? Shortness of breath        Past Surgical History:   Procedure Laterality Date   ??? Hx gyn     ??? Hx tubal ligation           Family History:   Problem Relation Age of Onset   ??? Cancer Other    ??? Diabetes Other    ??? Heart Disease Other    ??? Hypertension Other    ??? Stroke Other        Social History     Social History   ??? Marital status: SINGLE     Spouse name: N/A   ??? Number of children: N/A   ??? Years of education: N/A     Occupational History   ??? Not on file.     Social History Main Topics   ??? Smoking status: Current Some Day Smoker   ??? Smokeless tobacco: Never Used   ??? Alcohol use Yes      Comment: rarely   ??? Drug use: No   ??? Sexual activity: Not on file     Other Topics Concern   ??? Not on file     Social History Narrative         ALLERGIES: Review of patient's allergies indicates no known allergies.    Review of Systems   All other systems reviewed and are negative.      Vitals:    08/03/15 0845   BP: (!) 131/92   Pulse: 85   Resp: 22   Temp: 98.1 ??F (36.7 ??C)   SpO2: 95%   Weight: 83.9 kg (185 lb)   Height:  (1.575 m)            Physical Exam   Constitutional: She is oriented to person, place, and time. She appears well-developed.   HENT:   Head: Normocephalic and atraumatic.    Eyes: EOM are normal. Pupils are equal, round, and reactive to light.   Neck: Normal range of motion. Neck supple.   Cardiovascular: Normal rate, regular rhythm and normal heart sounds.  Exam reveals no friction rub.    No murmur heard.  Pulmonary/Chest: Effort normal and breath sounds normal. No respiratory distress. She has no wheezes.   No wheeze.      Abdominal: Soft. She exhibits no distension. There is no tenderness. There is no rebound and no guarding.   Musculoskeletal: Normal range of motion.   Neurological: She is alert and oriented to person, place, and time.   Skin: Skin is warm and dry.   Psychiatric: She has a normal mood and affect. Her  behavior is normal. Thought content normal.        MDM  Number of Diagnoses or Management Options  Diagnosis management comments:   49 year old female history of chronic bronchitis non-smoker presents with 4 days of cough chills fever.  Will check basic blood work and an x-ray overall she is well appearing clinically and will likely be discharged with her with or without antibiotics pending results of the x-ray    Cxr: napd    1 cough; no wheeze; cxr neg; tx with cough meds.     ED Course       Procedures           SCRIBE ATTESTATION STATEMENT  Documented by: Quillian Quince scribing for, and in the presence of, Algis Downs, MD 9:22 AM     PROVIDER ATTESTATION STATEMENT  I personally performed the services described in the documentation, reviewed the documentation, as recorded by the scribe in my presence, and it accurately and completely records my words and actions.  Algis Downs, MD

## 2015-08-03 NOTE — ED Notes (Signed)
Pt hourly rounding competed.  Safety   Pt (*) resting on stretcher with side rails up and call bell in reach.    () in chair    () in parents arms.  Toileting   Pt offered ()Bedpan     ()Assistance to Restroom     ()Urinal  Ongoing Updates  Updated on plan of care and status of test results.  Pain Management  Inquired as to comfort and offered comfort measures:    () warm blankets   () dimmed lights

## 2015-08-03 NOTE — ED Notes (Signed)
Patient presents today for fever and cough that she has had for four days, has not taken anything for her cough, but has taken tylenol for her fever last night.

## 2015-08-03 NOTE — ED Notes (Signed)
I have reviewed discharge instructions with the patient.  The patient verbalized understanding. All discharge education and paperwork given, including follow up no questions at this time.

## 2015-08-03 NOTE — ED Notes (Signed)
Pt hourly rounding competed.  Safety   Pt (*) resting on stretcher with side rails up and call bell in reach.    () in chair    () in parents arms.  Toileting   Pt offered ()Bedpan     ()Assistance to Restroom     ()Urinal  Ongoing Updates  Updated on plan of care and status of test results.  Pain Management  Inquired as to comfort and offered comfort measures:    (*) warm blankets   () dimmed lights

## 2015-08-03 NOTE — ED Notes (Signed)
Patient assisted to rest room, back on monitor x 3, call bell within reach, bed at lowest level.

## 2015-08-03 NOTE — ED Triage Notes (Signed)
Pt c/o fever 101 yesterday and cough times 4 days.  Pt wearing mask in triage because coughing.

## 2016-01-21 ENCOUNTER — Emergency Department (HOSPITAL_COMMUNITY)
Admission: EM | Admit: 2016-01-21 | Discharge: 2016-01-21 | Disposition: A | Discharge status: Home / Self Care | Payer: 59 | Attending: Emergency Medicine | Admitting: Emergency Medicine

## 2016-01-21 ENCOUNTER — Emergency Department (HOSPITAL_COMMUNITY): Payer: 59

## 2016-01-21 ENCOUNTER — Encounter (HOSPITAL_COMMUNITY): Payer: Self-pay | Admitting: *Deleted

## 2016-01-21 DIAGNOSIS — R109 Unspecified abdominal pain: Secondary | ICD-10-CM

## 2016-01-21 DIAGNOSIS — F17210 Nicotine dependence, cigarettes, uncomplicated: Secondary | ICD-10-CM | POA: Diagnosis not present

## 2016-01-21 DIAGNOSIS — Z8639 Personal history of other endocrine, nutritional and metabolic disease: Secondary | ICD-10-CM | POA: Insufficient documentation

## 2016-01-21 DIAGNOSIS — R1031 Right lower quadrant pain: Secondary | ICD-10-CM | POA: Diagnosis not present

## 2016-01-21 DIAGNOSIS — R42 Dizziness and giddiness: Secondary | ICD-10-CM | POA: Insufficient documentation

## 2016-01-21 DIAGNOSIS — I2510 Atherosclerotic heart disease of native coronary artery without angina pectoris: Secondary | ICD-10-CM | POA: Insufficient documentation

## 2016-01-21 DIAGNOSIS — R112 Nausea with vomiting, unspecified: Secondary | ICD-10-CM | POA: Insufficient documentation

## 2016-01-21 DIAGNOSIS — R1011 Right upper quadrant pain: Secondary | ICD-10-CM | POA: Insufficient documentation

## 2016-01-21 DIAGNOSIS — Z3202 Encounter for pregnancy test, result negative: Secondary | ICD-10-CM | POA: Insufficient documentation

## 2016-01-21 DIAGNOSIS — J45909 Unspecified asthma, uncomplicated: Secondary | ICD-10-CM | POA: Diagnosis not present

## 2016-01-21 DIAGNOSIS — R1030 Lower abdominal pain, unspecified: Secondary | ICD-10-CM | POA: Diagnosis not present

## 2016-01-21 DIAGNOSIS — R1013 Epigastric pain: Secondary | ICD-10-CM | POA: Diagnosis not present

## 2016-01-21 DIAGNOSIS — R630 Anorexia: Secondary | ICD-10-CM | POA: Insufficient documentation

## 2016-01-21 LAB — URINALYSIS, ROUTINE W REFLEX MICROSCOPIC
Bilirubin Urine: NEGATIVE
Glucose, UA: NEGATIVE mg/dL
Hgb urine dipstick: NEGATIVE
KETONES UR: NEGATIVE mg/dL
Leukocytes, UA: NEGATIVE
NITRITE: NEGATIVE
PROTEIN: NEGATIVE mg/dL
Specific Gravity, Urine: 1.023 (ref 1.005–1.030)
pH: 8 (ref 5.0–8.0)

## 2016-01-21 LAB — COMPREHENSIVE METABOLIC PANEL
ALT: 11 U/L — ABNORMAL LOW (ref 14–54)
AST: 17 U/L (ref 15–41)
Albumin: 3.7 g/dL (ref 3.5–5.0)
Alkaline Phosphatase: 74 U/L (ref 38–126)
Anion gap: 10 (ref 5–15)
BILIRUBIN TOTAL: 0.4 mg/dL (ref 0.3–1.2)
BUN: 13 mg/dL (ref 6–20)
CO2: 25 mmol/L (ref 22–32)
Calcium: 9.4 mg/dL (ref 8.9–10.3)
Chloride: 106 mmol/L (ref 101–111)
Creatinine, Ser: 0.69 mg/dL (ref 0.44–1.00)
GFR calc Af Amer: >60 mL/min (ref >60)
Glucose, Bld: 110 mg/dL — ABNORMAL HIGH (ref 65–99)
Potassium: 4 mmol/L (ref 3.5–5.1)
Sodium: 141 mmol/L (ref 135–145)
TOTAL PROTEIN: 7.3 g/dL (ref 6.5–8.1)

## 2016-01-21 LAB — CBC
HCT: 42.1 % (ref 36.0–46.0)
Hemoglobin: 13.7 g/dL (ref 12.0–15.0)
MCH: 29.6 pg (ref 26.0–34.0)
MCHC: 32.5 g/dL (ref 30.0–36.0)
MCV: 90.9 fL (ref 78.0–100.0)
PLATELETS: 325 10*3/uL (ref 150–400)
RBC: 4.63 MIL/uL (ref 3.87–5.11)
RDW: 14 % (ref 11.5–15.5)
WBC: 9 10*3/uL (ref 4.0–10.5)

## 2016-01-21 LAB — PREGNANCY, URINE: Preg Test, Ur: NEGATIVE

## 2016-01-21 LAB — LIPASE, BLOOD: Lipase: 44 U/L (ref 11–51)

## 2016-01-21 MED ORDER — ONDANSETRON 4 MG PO TBDP
ORAL_TABLET | ORAL | Status: DC
Start: 2016-01-21 — End: 2016-01-22
  Filled 2016-01-21: qty 1

## 2016-01-21 MED ORDER — IOHEXOL 300 MG/ML  SOLN
100.0000 mL | Freq: Once | INTRAMUSCULAR | Status: AC | PRN
  Administered 2016-01-21: 100 mL via INTRAVENOUS

## 2016-01-21 MED ORDER — MORPHINE SULFATE (PF) 4 MG/ML IV SOLN
4.0000 mg | Freq: Once | INTRAVENOUS | Status: AC
  Administered 2016-01-21: 4 mg via INTRAVENOUS
  Filled 2016-01-21: qty 1

## 2016-01-21 MED ORDER — ONDANSETRON 4 MG PO TBDP
4.0000 mg | ORAL_TABLET | Freq: Once | ORAL | Status: AC | PRN
  Administered 2016-01-21: 4 mg via ORAL

## 2016-01-21 MED ORDER — SODIUM CHLORIDE 0.9 % IV BOLUS (SEPSIS)
1000.0000 mL | Freq: Once | INTRAVENOUS | Status: AC
  Administered 2016-01-21: 1000 mL via INTRAVENOUS

## 2016-01-21 MED ORDER — IBUPROFEN 800 MG PO TABS: 800.0000 mg | ORAL_TABLET | Freq: Three times a day (TID) | ORAL | Status: DC | PRN

## 2016-01-21 MED ORDER — ONDANSETRON HCL 4 MG PO TABS: 4.0000 mg | ORAL_TABLET | Freq: Three times a day (TID) | ORAL | Status: DC | PRN

## 2016-01-21 NOTE — ED Notes (Addendum)
Pt reports onset of vomiting and abdominal pain this morning. Pain bilateral lower stomach. Reports fever last night.

## 2016-01-21 NOTE — ED Notes (Signed)
Pt ambulating independently w/ steady gait on d/c in no acute distress, A&Ox4. D/c instructions reviewed w/ pt and family - pt and family deny any further questions or concerns at present. Rx given x2  

## 2016-01-21 NOTE — ED Provider Notes (Signed)
CSN: [id number]     Arrival date & time 01/21/16  1545 History  By signing my name below, I, Freida Busman, attest that this documentation has been prepared under the direction and in the presence of non-physician practitioner, Trixie Dredge, PA-C. Electronically Signed: Freida Busman, Scribe. 01/21/2016. 6:08 PM.    Chief Complaint  Patient presents with  . Emesis  . Abdominal Pain     The history is provided by the patient. No language interpreter was used.     HPI Comments:  Karita Dralle is a 50 y.o. female who presents to the Emergency Department complaining of 8/10 lower abdominal pain since 0200 this AM. She reports associated nausea and vomiting with 6 episodes since 1000 today, chills,  decreased PO intake today, mild lightheadedness, dry cough, generalized weakness and HA. No alleviating factors noted. She denies fever, CP, SOB, blood in stool, hematuria, dysuria, and vaginal discharge/bleeding. Her last BM was at 0600 this AM. The stool was formed but she notes abdominal pain with her BM. She reports sick contacts at home with similar symptoms. Pt is sexually active but denies sexual activity for the last month.   Past Medical History  Diagnosis Date  . Coronary artery disease   . Asthma   . Bronchitis   . High cholesterol   . Shoulder dislocation    Past Surgical History  Procedure Laterality Date  . Tubal ligation     No family history on file. Social History  Substance Use Topics  . Smoking status: Current Some Day Smoker    Types: Cigars  . Smokeless tobacco: Never Used  . Alcohol Use: Yes     Comment: occasionally   OB History    No data available     Review of Systems  Constitutional: Positive for chills and appetite change. Negative for fever.  Respiratory: Positive for cough. Negative for shortness of breath.   Cardiovascular: Negative for chest pain.  Gastrointestinal: Positive for nausea, vomiting and abdominal pain. Negative for diarrhea.   Genitourinary: Negative for dysuria, hematuria, vaginal bleeding and vaginal discharge.  Neurological: Positive for weakness (generalized ), light-headedness and headaches.  All other systems reviewed and are negative.   Allergies  Review of patient's allergies indicates no known allergies.  Home Medications   Prior to Admission medications   Medication Sig Start Date End Date Taking? Authorizing Provider  cyclobenzaprine (FLEXERIL) 5 MG tablet Take 1 tablet (5 mg total) by mouth 3 (three) times daily as needed for muscle spasms. 10/06/14   Richardean Canal, MD  guaiFENesin (ROBITUSSIN) 100 MG/5ML SOLN Take 5 mLs (100 mg total) by mouth every 6 (six) hours as needed for cough or to loosen phlegm. 10/06/14   Richardean Canal, MD  omeprazole (PRILOSEC) 20 MG capsule Take 1 capsule (20 mg total) by mouth daily. 10/06/14   Richardean Canal, MD   BP 121/99 mmHg  Pulse 79  Temp(Src) 97.9 F (36.6 C) (Oral)  Resp 16  Ht  (1.575 m)  Wt 190 lb (86.183 kg)  BMI 34.74 kg/m2  SpO2 100% Physical Exam  Constitutional: She appears well-developed and well-nourished. No distress.  HENT:  Head: Normocephalic and atraumatic.  Neck: Neck supple.  Cardiovascular: Normal rate and regular rhythm.   Pulmonary/Chest: Effort normal and breath sounds normal. No respiratory distress. She has no wheezes. She has no rales.  Abdominal: Soft. She exhibits no distension. There is tenderness. There is no rebound and no guarding.  epigastric RUQ and RLQ tenderness  Negative murphys   Neurological: She is alert.  Skin: She is not diaphoretic.  Nursing note and vitals reviewed.   ED Course  Procedures   DIAGNOSTIC STUDIES:  Oxygen Saturation is 100% on RA, normal by my interpretation.    COORDINATION OF CARE:  6:04 PM Pt updated with results. Will order abdominal imaging. Discussed treatment plan with pt at bedside and pt agreed to plan.  Labs Review Labs Reviewed  COMPREHENSIVE METABOLIC PANEL - Abnormal;  Notable for the following:    Glucose, Bld 110 (*)    ALT 11 (*)    All other components within normal limits  LIPASE, BLOOD  CBC  URINALYSIS, ROUTINE W REFLEX MICROSCOPIC (NOT AT [facility])  PREGNANCY, URINE    Imaging Review Ct Abdomen Pelvis W Contrast  01/21/2016  CLINICAL DATA:  Lower abdominal pain starting today. Nausea and vomiting. Fever. EXAM: CT ABDOMEN AND PELVIS WITH CONTRAST TECHNIQUE: Multidetector CT imaging of the abdomen and pelvis was performed using the standard protocol following bolus administration of intravenous contrast. CONTRAST:  100mL OMNIPAQUE IOHEXOL 300 MG/ML  SOLN COMPARISON:  None. FINDINGS: Lower chest: Bibasilar subsegmental atelectasis. Borderline cardiomegaly, without pericardial or pleural effusion. Hepatobiliary: Too small to characterize lateral segment left liver lobe low-density lesions are likely cysts. Normal gallbladder, without biliary ductal dilatation. Pancreas: Normal, without mass or ductal dilatation. Spleen: Normal in size, without focal abnormality. Adrenals/Urinary Tract: Normal adrenal glands. Fetal lobulation versus bilateral renal scarring. No hydronephrosis. Normal urinary bladder. Stomach/Bowel: Normal stomach, without wall thickening. Normal colon, appendix, and terminal ileum. Normal small bowel. Vascular/Lymphatic: Normal caliber of the aorta and branch vessels. No abdominopelvic adenopathy. Reproductive: Normal uterus and adnexa. Other: No significant free fluid. Bilateral small fat containing inguinal hernias. Musculoskeletal: Disc bulge at L3-4 and L4-5. IMPRESSION: No acute process in the abdomen or pelvis. Electronically Signed   By: Jeronimo GreavesKyle  Talbot M.D.   On: 01/21/2016 20:37      MDM   Final diagnoses:  Abdominal pain, unspecified abdominal location  Non-intractable vomiting with nausea, vomiting of unspecified type    Afebrile, nontoxic patient with right sided abdominal pain, N/V.  Workup unremarkable, feeling better after  medications.   D/C home with symptomatic medication, PCP follow up.  Discussed result, findings, treatment, and follow up  with patient.  Pt given return precautions.  Pt verbalizes understanding and agrees with plan.        I personally performed the services described in this documentation, which was scribed in my presence. The recorded information has been reviewed and is accurate.    Trixie Dredgemily Amyrah Pinkhasov, PA-C 01/21/16 2254  Doug SouSam Jacubowitz, MD 01/22/16 [phone]

## 2016-01-21 NOTE — Discharge Instructions (Signed)
Read the information below.  Use the prescribed medication as directed.  Please discuss all new medications with your pharmacist.  You may return to the Emergency Department at any time for worsening condition or any new symptoms that concern you.  If you develop high fevers, worsening abdominal pain, uncontrolled vomiting, or are unable to tolerate fluids by mouth, return to the ER for a recheck.   ° ° °Abdominal Pain, Adult °Many things can cause abdominal pain. Usually, abdominal pain is not caused by a disease and will improve without treatment. It can often be observed and treated at home. Your health care provider will do a physical exam and possibly order blood tests and X-rays to help determine the seriousness of your pain. However, in many cases, more time must pass before a clear cause of the pain can be found. Before that point, your health care provider may not know if you need more testing or further treatment. °HOME CARE INSTRUCTIONS °Monitor your abdominal pain for any changes. The following actions may help to alleviate any discomfort you are experiencing: °· Only take over-the-counter or prescription medicines as directed by your health care provider. °· Do not take laxatives unless directed to do so by your health care provider. °· Try a clear liquid diet (broth, tea, or water) as directed by your health care provider. Slowly move to a bland diet as tolerated. °SEEK MEDICAL CARE IF: °· You have unexplained abdominal pain. °· You have abdominal pain associated with nausea or diarrhea. °· You have pain when you urinate or have a bowel movement. °· You experience abdominal pain that wakes you in the night. °· You have abdominal pain that is worsened or improved by eating food. °· You have abdominal pain that is worsened with eating fatty foods. °· You have a fever. °SEEK IMMEDIATE MEDICAL CARE IF: °· Your pain does not go away within 2 hours. °· You keep throwing up (vomiting). °· Your pain is felt  only in portions of the abdomen, such as the right side or the left lower portion of the abdomen. °· You pass bloody or black tarry stools. °MAKE SURE YOU: °· Understand these instructions. °· Will watch your condition. °· Will get help right away if you are not doing well or get worse. °  °This information is not intended to replace advice given to you by your health care provider. Make sure you discuss any questions you have with your health care provider. °  °Document Released: 08/16/2005 Document Revised: 07/28/2015 Document Reviewed: 07/16/2013 °Elsevier Interactive Patient Education ©2016 [employer] ° °

## 2016-02-03 ENCOUNTER — Emergency Department (HOSPITAL_COMMUNITY): Payer: 59

## 2016-02-03 ENCOUNTER — Encounter (HOSPITAL_COMMUNITY): Payer: Self-pay | Admitting: Emergency Medicine

## 2016-02-03 ENCOUNTER — Emergency Department (HOSPITAL_COMMUNITY)
Admission: EM | Admit: 2016-02-03 | Discharge: 2016-02-04 | Disposition: A | Discharge status: Home / Self Care | Payer: 59 | Source: Home / Self Care

## 2016-02-03 DIAGNOSIS — R05 Cough: Secondary | ICD-10-CM | POA: Diagnosis not present

## 2016-02-03 DIAGNOSIS — J45909 Unspecified asthma, uncomplicated: Secondary | ICD-10-CM | POA: Diagnosis not present

## 2016-02-03 DIAGNOSIS — Z8639 Personal history of other endocrine, nutritional and metabolic disease: Secondary | ICD-10-CM | POA: Insufficient documentation

## 2016-02-03 DIAGNOSIS — I2510 Atherosclerotic heart disease of native coronary artery without angina pectoris: Secondary | ICD-10-CM

## 2016-02-03 DIAGNOSIS — R0789 Other chest pain: Secondary | ICD-10-CM | POA: Insufficient documentation

## 2016-02-03 DIAGNOSIS — F17210 Nicotine dependence, cigarettes, uncomplicated: Secondary | ICD-10-CM

## 2016-02-03 DIAGNOSIS — J069 Acute upper respiratory infection, unspecified: Secondary | ICD-10-CM | POA: Insufficient documentation

## 2016-02-03 DIAGNOSIS — R0602 Shortness of breath: Secondary | ICD-10-CM | POA: Diagnosis not present

## 2016-02-03 DIAGNOSIS — J45901 Unspecified asthma with (acute) exacerbation: Secondary | ICD-10-CM | POA: Insufficient documentation

## 2016-02-03 DIAGNOSIS — Z8781 Personal history of (healed) traumatic fracture: Secondary | ICD-10-CM | POA: Diagnosis not present

## 2016-02-03 LAB — CBC
HCT: 40.1 % (ref 36.0–46.0)
HEMOGLOBIN: 13.1 g/dL (ref 12.0–15.0)
MCH: 29.7 pg (ref 26.0–34.0)
MCHC: 32.7 g/dL (ref 30.0–36.0)
MCV: 90.9 fL (ref 78.0–100.0)
PLATELETS: 313 10*3/uL (ref 150–400)
RBC: 4.41 MIL/uL (ref 3.87–5.11)
RDW: 13.6 % (ref 11.5–15.5)
WBC: 10.8 10*3/uL — AB (ref 4.0–10.5)

## 2016-02-03 LAB — BASIC METABOLIC PANEL
Anion gap: 15 (ref 5–15)
BUN: 13 mg/dL (ref 6–20)
CO2: 24 mmol/L (ref 22–32)
CREATININE: 0.81 mg/dL (ref 0.44–1.00)
Calcium: 9.4 mg/dL (ref 8.9–10.3)
Chloride: 105 mmol/L (ref 101–111)
GFR calc Af Amer: >60 mL/min (ref >60)
Glucose, Bld: 82 mg/dL (ref 65–99)
Potassium: 3.8 mmol/L (ref 3.5–5.1)
SODIUM: 144 mmol/L (ref 135–145)

## 2016-02-03 LAB — I-STAT TROPONIN, ED: Troponin i, poc: 0 ng/mL (ref 0.00–0.08)

## 2016-02-03 MED ORDER — ALBUTEROL SULFATE (2.5 MG/3ML) 0.083% IN NEBU
5.0000 mg | INHALATION_SOLUTION | Freq: Once | RESPIRATORY_TRACT | Status: AC
  Administered 2016-02-03: 5 mg via RESPIRATORY_TRACT

## 2016-02-03 MED ORDER — ALBUTEROL SULFATE (2.5 MG/3ML) 0.083% IN NEBU
INHALATION_SOLUTION | RESPIRATORY_TRACT | Status: AC
  Filled 2016-02-03: qty 6

## 2016-02-03 NOTE — ED Notes (Signed)
Pt. reports SOB with chest tightness , productive cough /chest congestion onset this week .

## 2016-02-04 ENCOUNTER — Encounter (HOSPITAL_COMMUNITY): Payer: Self-pay | Admitting: *Deleted

## 2016-02-04 ENCOUNTER — Emergency Department (HOSPITAL_COMMUNITY): Payer: 59

## 2016-02-04 ENCOUNTER — Emergency Department (HOSPITAL_COMMUNITY)
Admission: EM | Admit: 2016-02-04 | Discharge: 2016-02-04 | Disposition: A | Discharge status: Home / Self Care | Payer: 59 | Attending: Emergency Medicine | Admitting: Emergency Medicine

## 2016-02-04 DIAGNOSIS — R05 Cough: Secondary | ICD-10-CM

## 2016-02-04 DIAGNOSIS — J069 Acute upper respiratory infection, unspecified: Secondary | ICD-10-CM | POA: Diagnosis not present

## 2016-02-04 DIAGNOSIS — R059 Cough, unspecified: Secondary | ICD-10-CM

## 2016-02-04 DIAGNOSIS — F17210 Nicotine dependence, cigarettes, uncomplicated: Secondary | ICD-10-CM | POA: Diagnosis not present

## 2016-02-04 MED ORDER — PREDNISONE 20 MG PO TABS: 40.0000 mg | ORAL_TABLET | Freq: Every day | ORAL | Status: DC

## 2016-02-04 MED ORDER — IBUPROFEN 800 MG PO TABS: 800.0000 mg | ORAL_TABLET | Freq: Three times a day (TID) | ORAL | Status: DC

## 2016-02-04 MED ORDER — GUAIFENESIN ER 600 MG PO TB12: 600.0000 mg | ORAL_TABLET | Freq: Two times a day (BID) | ORAL | Status: DC | PRN

## 2016-02-04 MED ORDER — BENZONATATE 100 MG PO CAPS: 100.0000 mg | ORAL_CAPSULE | Freq: Three times a day (TID) | ORAL | Status: DC

## 2016-02-04 NOTE — ED Provider Notes (Signed)
CSN: [id number]     Arrival date & time 02/04/16  4098 History   First MD Initiated Contact with Patient 02/04/16 0901     Chief Complaint  Patient presents with  . URI     HPI   MS. Angelica Miller is an 50 y.o. female with history of CAD, asthma who presents to the ED for evaluation of URI symptoms. She states that she started feeling sick about three days ago. Reports dry cough, headache, body aches, chills. She states she has asthma and bronchitis so has an inhaler at home and has used it a couple times for SOB after coughing fit. She otherwise denies CP or SOB. She is unsure if she has had a fever but reports chills. She has tried OTC meds with no relief. Denies abdominal pain, n/v/d. Denies nasal congestion, ear pain.   Past Medical History  Diagnosis Date  . Coronary artery disease   . Asthma   . Bronchitis   . High cholesterol   . Shoulder dislocation    Past Surgical History  Procedure Laterality Date  . Tubal ligation     No family history on file. Social History  Substance Use Topics  . Smoking status: Current Some Day Smoker    Types: Cigars  . Smokeless tobacco: Never Used  . Alcohol Use: Yes     Comment: occasionally   OB History    No data available     Review of Systems  All other systems reviewed and are negative.     Allergies  Review of patient's allergies indicates no known allergies.  Home Medications   Prior to Admission medications   Medication Sig Start Date End Date Taking? Authorizing Provider  benzonatate (TESSALON) 100 MG capsule Take 1 capsule (100 mg total) by mouth every 8 (eight) hours. 02/04/16   Ace Gins Nike Southers, PA-C  cyclobenzaprine (FLEXERIL) 5 MG tablet Take 1 tablet (5 mg total) by mouth 3 (three) times daily as needed for muscle spasms. 10/06/14   Richardean Canal, MD  guaiFENesin (MUCINEX) 600 MG 12 hr tablet Take 1 tablet (600 mg total) by mouth 2 (two) times daily as needed for to loosen phlegm. 02/04/16   Ace Gins Cameo Shewell, PA-C  guaiFENesin  (ROBITUSSIN) 100 MG/5ML SOLN Take 5 mLs (100 mg total) by mouth every 6 (six) hours as needed for cough or to loosen phlegm. 10/06/14   Richardean Canal, MD  ibuprofen (ADVIL,MOTRIN) 800 MG tablet Take 1 tablet (800 mg total) by mouth every 8 (eight) hours as needed for mild pain or moderate pain. 01/21/16   Trixie Dredge, PA-C  ibuprofen (ADVIL,MOTRIN) 800 MG tablet Take 1 tablet (800 mg total) by mouth 3 (three) times daily. 02/04/16   Ace Gins Timathy Newberry, PA-C  omeprazole (PRILOSEC) 20 MG capsule Take 1 capsule (20 mg total) by mouth daily. 10/06/14   Richardean Canal, MD  ondansetron (ZOFRAN) 4 MG tablet Take 1 tablet (4 mg total) by mouth every 8 (eight) hours as needed for nausea or vomiting. 01/21/16   Trixie Dredge, PA-C  predniSONE (DELTASONE) 20 MG tablet Take 2 tablets (40 mg total) by mouth daily. 02/04/16   Ace Gins Corabelle Spackman, PA-C   BP 148/81 mmHg  Pulse 74  Temp(Src) 98 F (36.7 C) (Oral)  Resp 16  SpO2 98% Physical Exam  Constitutional: She is oriented to person, place, and time. No distress.  HENT:  Head: Atraumatic.  Right Ear: External ear normal.  Left Ear: External ear normal.  Nose:  Nose normal.  Eyes: Conjunctivae are normal. No scleral icterus.  Neck: Normal range of motion. Neck supple.  Cardiovascular: Normal rate, regular rhythm and normal heart sounds.   Pulmonary/Chest: Effort normal and breath sounds normal. No respiratory distress. She has no wheezes. She has no rales. She exhibits no tenderness.  Abdominal: Soft. She exhibits no distension. There is no tenderness.  Musculoskeletal: She exhibits no edema.  Neurological: She is alert and oriented to person, place, and time.  Skin: Skin is warm and dry. She is not diaphoretic.  Psychiatric: She has a normal mood and affect. Her behavior is normal.  Nursing note and vitals reviewed.   ED Course  Procedures (including critical care time) Labs Review Labs Reviewed - No data to display  Imaging Review Dg Chest 2 View  02/03/2016   CLINICAL DATA:  Shortness of breath with chest tightness this week EXAM: CHEST  2 VIEW COMPARISON:  October 06, 2014 FINDINGS: The heart size and mediastinal contours are within normal limits. There is no focal infiltrate, pulmonary edema, or pleural effusion. There is scoliosis of spine. IMPRESSION: No active cardiopulmonary disease. Electronically Signed   By: Sherian ReinWei-Chen  Lin M.D.   On: 02/03/2016 21:17   I have personally reviewed and evaluated these images and lab results as part of my medical decision-making.   EKG Interpretation None      MDM   Final diagnoses:  URI (upper respiratory infection)  Cough    Likely viral. CXR negative. Exam is nonfocal. Pt is afebrile with no hypoxia, tachypnea, no adventitious lung sounds. Rx given for supportive meds including prednisone, tessalon, mucinex, and ibuprofen. Pt has inhaler at home. Instructed to f/u with PCP. ER return precautions given.    Carlene CoriaSerena Y Dalisha Shively, PA-C 02/05/16 [id number]  Gerhard Munchobert Lockwood, MD 02/07/16 [id number]

## 2016-02-04 NOTE — Discharge Instructions (Signed)
Upper Respiratory Infection, Adult Most upper respiratory infections (URIs) are a viral infection of the air passages leading to the lungs. A URI affects the nose, throat, and upper air passages. The most common type of URI is nasopharyngitis and is typically referred to as "the common cold." URIs run their course and usually go away on their own. Most of the time, a URI does not require medical attention, but sometimes a bacterial infection in the upper airways can follow a viral infection. This is called a secondary infection. Sinus and middle ear infections are common types of secondary upper respiratory infections. Bacterial pneumonia can also complicate a URI. A URI can worsen asthma and chronic obstructive pulmonary disease (COPD). Sometimes, these complications can require emergency medical care and may be life threatening.  CAUSES Almost all URIs are caused by viruses. A virus is a type of germ and can spread from one person to another.  RISKS FACTORS You may be at risk for a URI if:   You smoke.   You have chronic heart or lung disease.  You have a weakened defense (immune) system.   You are very young or very old.   You have nasal allergies or asthma.  You work in crowded or poorly ventilated areas.  You work in health care facilities or schools. SIGNS AND SYMPTOMS  Symptoms typically develop 2-3 days after you come in contact with a cold virus. Most viral URIs last 7-10 days. However, viral URIs from the influenza virus (flu virus) can last 14-18 days and are typically more severe. Symptoms may include:   Runny or stuffy (congested) nose.   Sneezing.   Cough.   Sore throat.   Headache.   Fatigue.   Fever.   Loss of appetite.   Pain in your forehead, behind your eyes, and over your cheekbones (sinus pain).  Muscle aches.  DIAGNOSIS  Your health care provider may diagnose a URI by:  Physical exam.  Tests to check that your symptoms are not due to  another condition such as:  Strep throat.  Sinusitis.  Pneumonia.  Asthma. TREATMENT  A URI goes away on its own with time. It cannot be cured with medicines, but medicines may be prescribed or recommended to relieve symptoms. Medicines may help:  Reduce your fever.  Reduce your cough.  Relieve nasal congestion. HOME CARE INSTRUCTIONS   Take medicines only as directed by your health care provider.   Gargle warm saltwater or take cough drops to comfort your throat as directed by your health care provider.  Use a warm mist humidifier or inhale steam from a shower to increase air moisture. This may make it easier to breathe.  Drink enough fluid to keep your urine clear or pale yellow.   Eat soups and other clear broths and maintain good nutrition.   Rest as needed.   Return to work when your temperature has returned to normal or as your health care provider advises. You may need to stay home longer to avoid infecting others. You can also use a face mask and careful hand washing to prevent spread of the virus.  Increase the usage of your inhaler if you have asthma.   Do not use any tobacco products, including cigarettes, chewing tobacco, or electronic cigarettes. If you need help quitting, ask your health care provider. PREVENTION  The best way to protect yourself from getting a cold is to practice good hygiene.   Avoid oral or hand contact with people with cold   symptoms.   Wash your hands often if contact occurs.  There is no clear evidence that vitamin C, vitamin E, echinacea, or exercise reduces the chance of developing a cold. However, it is always recommended to get plenty of rest, exercise, and practice good nutrition.  SEEK MEDICAL CARE IF:   You are getting worse rather than better.   Your symptoms are not controlled by medicine.   You have chills.  You have worsening shortness of breath.  You have brown or red mucus.  You have yellow or brown nasal  discharge.  You have pain in your face, especially when you bend forward.  You have a fever.  You have swollen neck glands.  You have pain while swallowing.  You have white areas in the back of your throat. SEEK IMMEDIATE MEDICAL CARE IF:   You have severe or persistent:  Headache.  Ear pain.  Sinus pain.  Chest pain.  You have chronic lung disease and any of the following:  Wheezing.  Prolonged cough.  Coughing up blood.  A change in your usual mucus.  You have a stiff neck.  You have changes in your:  Vision.  Hearing.  Thinking.  Mood. MAKE SURE YOU:   Understand these instructions.  Will watch your condition.  Will get help right away if you are not doing well or get worse.   This information is not intended to replace advice given to you by your health care provider. Make sure you discuss any questions you have with your health care provider.   Document Released: 05/02/2001 Document Revised: 03/23/2015 Document Reviewed: 02/11/2014 Elsevier Interactive Patient Education 2016 [employer]vier Inc.  

## 2016-02-04 NOTE — ED Notes (Signed)
Report 3 days of cough, congestion, headache and chills

## 2016-02-07 ENCOUNTER — Ambulatory Visit (INDEPENDENT_AMBULATORY_CARE_PROVIDER_SITE_OTHER): Payer: 59 | Admitting: Physician Assistant

## 2016-02-07 VITALS — BP 122/80 | HR 61 | Temp 97.9°F | Resp 18 | Ht 62.0 in | Wt 195.0 lb

## 2016-02-07 DIAGNOSIS — J988 Other specified respiratory disorders: Secondary | ICD-10-CM

## 2016-02-07 DIAGNOSIS — J22 Unspecified acute lower respiratory infection: Secondary | ICD-10-CM

## 2016-02-07 DIAGNOSIS — J45909 Unspecified asthma, uncomplicated: Secondary | ICD-10-CM

## 2016-02-07 MED ORDER — AZITHROMYCIN 250 MG PO TABS: ORAL_TABLET | ORAL | Status: DC

## 2016-02-07 MED ORDER — IPRATROPIUM-ALBUTEROL 0.5-2.5 (3) MG/3ML IN SOLN: 3.0000 mL | Freq: Four times a day (QID) | RESPIRATORY_TRACT | Status: DC | PRN

## 2016-02-07 MED ORDER — HYDROCOD POLST-CPM POLST ER 10-8 MG/5ML PO SUER: 5.0000 mL | Freq: Every evening | ORAL | Status: DC | PRN

## 2016-02-07 NOTE — Progress Notes (Signed)
[facility] [address], [place] [id number] [phone]  Date:  02/07/2016   Name:  Angelica Miller   DOB:  10/16/1966   MRN:  [id number]  PCP:  No PCP Per Patient     History of Present Illness:  Angelica Miller is a 50 y.o. female patient who [facility] for chief complaint of cough, nasal congestion, fever, and chills. 5 days ago, she developed trouble breathing and uses albuterol 3-4 times per day, which helps.  She has a non-productive cough.  She is sneezing.  She is currently not having the fever and chills anymore at this time.  The cough is exhausting for her.  She has congestion and runny nose.  She is taking the mucinex and prednisone and tessalon pearls as advised by the ED when she visited 5 days ago.    He has fever off and on.          There are no active problems to display for this patient.   Past Medical History  Diagnosis Date  . Coronary artery disease   . Asthma   . Bronchitis   . High cholesterol   . Shoulder dislocation     Past Surgical History  Procedure Laterality Date  . Tubal ligation      Social History  Substance Use Topics  . Smoking status: Current Some Day Smoker    Types: Cigars  . Smokeless tobacco: Never Used  . Alcohol Use: Yes     Comment: occasionally    Family History  Problem Relation Age of Onset  . Mental illness Mother   . Hyperlipidemia Mother   . Hypertension Sister     No Known Allergies  Medication list has been reviewed and updated.  Current Outpatient Prescriptions on File Prior to Visit  Medication Sig Dispense Refill  . benzonatate (TESSALON) 100 MG capsule Take 1 capsule (100 mg total) by mouth every 8 (eight) hours. 21 capsule 0  . guaiFENesin (MUCINEX) 600 MG 12 hr tablet Take 1 tablet (600 mg total) by mouth 2 (two) times daily as needed for to loosen phlegm. 20 tablet 0  . ibuprofen (ADVIL,MOTRIN) 800 MG tablet Take 1 tablet (800 mg total) by mouth every 8 (eight) hours  as needed for mild pain or moderate pain. 15 tablet 0  . predniSONE (DELTASONE) 20 MG tablet Take 2 tablets (40 mg total) by mouth daily. 10 tablet 0  . cyclobenzaprine (FLEXERIL) 5 MG tablet Take 1 tablet (5 mg total) by mouth 3 (three) times daily as needed for muscle spasms. (Patient not taking: Reported on 02/07/2016) 15 tablet 0  . guaiFENesin (ROBITUSSIN) 100 MG/5ML SOLN Take 5 mLs (100 mg total) by mouth every 6 (six) hours as needed for cough or to loosen phlegm. (Patient not taking: Reported on 02/07/2016) 1200 mL 0  . ibuprofen (ADVIL,MOTRIN) 800 MG tablet Take 1 tablet (800 mg total) by mouth 3 (three) times daily. (Patient not taking: Reported on 02/07/2016) 21 tablet 0  . omeprazole (PRILOSEC) 20 MG capsule Take 1 capsule (20 mg total) by mouth daily. (Patient not taking: Reported on 02/07/2016) 30 capsule 0  . ondansetron (ZOFRAN) 4 MG tablet Take 1 tablet (4 mg total) by mouth every 8 (eight) hours as needed for nausea or vomiting. (Patient not taking: Reported on 02/07/2016) 15 tablet 0   No current facility-administered medications on file prior to visit.    ROS ROS otherwise unremarkable unless listed above.  Physical Examination:  BP 122/80 mmHg  Pulse 61  Temp(Src) 97.9 F (36.6 C) (Oral)  Resp 18  Ht  (1.575 m)  Wt 195 lb (88.451 kg)  BMI 35.66 kg/m2  SpO2 98% Ideal Body Weight: Weight in (lb) to have BMI = 25: 136.4  Physical Exam  Constitutional: She is oriented to person, place, and time. She appears well-developed and well-nourished. No distress.  HENT:  Head: Normocephalic and atraumatic.  Right Ear: Tympanic membrane, external ear and ear canal normal.  Left Ear: Tympanic membrane, external ear and ear canal normal.  Nose: Mucosal edema and rhinorrhea present. Right sinus exhibits no maxillary sinus tenderness and no frontal sinus tenderness. Left sinus exhibits no maxillary sinus tenderness and no frontal sinus tenderness.  Mouth/Throat: No uvula swelling.  No oropharyngeal exudate, posterior oropharyngeal edema or posterior oropharyngeal erythema.  Eyes: Conjunctivae and EOM are normal. Pupils are equal, round, and reactive to light.  Cardiovascular: Normal rate and regular rhythm.  Exam reveals no gallop, no distant heart sounds and no friction rub.   No murmur heard. Pulmonary/Chest: Effort normal. No respiratory distress. She has no decreased breath sounds. She has no wheezes. She has no rhonchi.  Lymphadenopathy:       Head (right side): No submandibular, no tonsillar, no preauricular and no posterior auricular adenopathy present.       Head (left side): No submandibular, no tonsillar, no preauricular and no posterior auricular adenopathy present.  Neurological: She is alert and oriented to person, place, and time.  Skin: She is not diaphoretic.  Psychiatric: She has a normal mood and affect. Her behavior is normal.     Assessment and Plan: Angelica Miller is a 50 y.o. female who is here today  This still could likely be viral. Advised her to start Tussionex as supportive treatment for her cough. In 48 hours she will start the azithromycin if she has no improvement. Alarming symptoms discussed with precautionary as advised Lower respiratory infection (e.g., bronchitis, pneumonia, pneumonitis, pulmonitis) - Plan: azithromycin (ZITHROMAX) 250 MG tablet, ipratropium-albuterol (DUONEB) 0.5-2.5 (3) MG/3ML SOLN, chlorpheniramine-HYDROcodone (TUSSIONEX PENNKINETIC ER) 10-8 MG/5ML SUER  Asthma, unspecified asthma severity, uncomplicated - Plan: ipratropium-albuterol (DUONEB) 0.5-2.5 (3) MG/3ML SOLN  Trena Platt, PA-C [facility] 02/07/2016 11:19 AM

## 2016-02-07 NOTE — Patient Instructions (Addendum)
     IF you received an x-ray today, you will receive an invoice from [facility]. Please contact [facility] at [phone] with questions or concerns regarding your invoice.   IF you received labwork today, you will receive an invoice from [facility]. Please contact [facility] at [phone] with questions or concerns regarding your invoice.   Our billing staff will not be able to assist you with questions regarding bills from these companies.  You will be contacted with the lab results as soon as they are available. The fastest way to get your results is to activate your My Chart account. Instructions are located on the last page of this paperwork. If you have not heard from us regarding the results in 2 weeks, please contact this office.     Please continue to hydrate the way you are doing. Take the medications as prescribed. If you are not improving within the next 48 hours, I would like you to start the antibiotics.

## 2016-06-14 ENCOUNTER — Encounter (HOSPITAL_COMMUNITY): Payer: Self-pay

## 2016-06-14 ENCOUNTER — Emergency Department (HOSPITAL_COMMUNITY)
Admission: EM | Admit: 2016-06-14 | Discharge: 2016-06-14 | Disposition: A | Discharge status: Home / Self Care | Payer: 59 | Attending: Emergency Medicine | Admitting: Emergency Medicine

## 2016-06-14 DIAGNOSIS — I2510 Atherosclerotic heart disease of native coronary artery without angina pectoris: Secondary | ICD-10-CM | POA: Insufficient documentation

## 2016-06-14 DIAGNOSIS — J45909 Unspecified asthma, uncomplicated: Secondary | ICD-10-CM | POA: Insufficient documentation

## 2016-06-14 DIAGNOSIS — Y999 Unspecified external cause status: Secondary | ICD-10-CM | POA: Insufficient documentation

## 2016-06-14 DIAGNOSIS — F17210 Nicotine dependence, cigarettes, uncomplicated: Secondary | ICD-10-CM | POA: Insufficient documentation

## 2016-06-14 DIAGNOSIS — Y9389 Activity, other specified: Secondary | ICD-10-CM | POA: Insufficient documentation

## 2016-06-14 DIAGNOSIS — S39012A Strain of muscle, fascia and tendon of lower back, initial encounter: Secondary | ICD-10-CM | POA: Insufficient documentation

## 2016-06-14 DIAGNOSIS — T148XXA Other injury of unspecified body region, initial encounter: Secondary | ICD-10-CM

## 2016-06-14 DIAGNOSIS — Z79899 Other long term (current) drug therapy: Secondary | ICD-10-CM | POA: Insufficient documentation

## 2016-06-14 DIAGNOSIS — X509XXA Other and unspecified overexertion or strenuous movements or postures, initial encounter: Secondary | ICD-10-CM | POA: Insufficient documentation

## 2016-06-14 DIAGNOSIS — Y929 Unspecified place or not applicable: Secondary | ICD-10-CM | POA: Insufficient documentation

## 2016-06-14 MED ORDER — HYDROCODONE-ACETAMINOPHEN 5-325 MG PO TABS: 2.0000 | ORAL_TABLET | ORAL | 0 refills | Status: DC | PRN

## 2016-06-14 MED ORDER — KETOROLAC TROMETHAMINE 60 MG/2ML IM SOLN
60.0000 mg | Freq: Once | INTRAMUSCULAR | Status: AC
  Administered 2016-06-14: 60 mg via INTRAMUSCULAR
  Filled 2016-06-14: qty 2

## 2016-06-14 MED ORDER — IBUPROFEN 600 MG PO TABS: 600.0000 mg | ORAL_TABLET | Freq: Four times a day (QID) | ORAL | 0 refills | Status: DC | PRN

## 2016-06-14 MED ORDER — CYCLOBENZAPRINE HCL 10 MG PO TABS: 10.0000 mg | ORAL_TABLET | Freq: Every day | ORAL | 0 refills | Status: DC

## 2016-06-14 NOTE — ED Triage Notes (Signed)
Patient here with right sided lower back pain since Saturday. Reports that she does a lot of twisting and bending at work, pain worse with any movdement

## 2016-06-14 NOTE — ED Provider Notes (Signed)
[facility]-EMERGENCY DEPT Provider Note   CSN: [id number] Arrival date & time: 06/14/16  1616  First Provider Contact: 5:17 PM  By signing my name below, I, Vista Mink, attest that this documentation has been prepared under the direction and in the presence of Kayla Huberta Tompkins PA-C.  Electronically Signed: Vista Mink, ED Scribe. 06/14/16. 5:27 PM.   History   Chief Complaint Chief Complaint  Patient presents with  . Back Pain   HPI Comments: Angelica Miller is a 49 y.o. female who presents to the Emergency Department complaining of sudden onset, gradually worsening back pain onset four days ago. Pt reports sudden onset pain four days ago while driving in her car. Pt also reports intermittent shooting pain that radiates down both of her lower extremities. Pt reports difficulty ambulating and sleeping due to the pain. 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 Pt denies any Hx of back pain. Pt reports that her pain is alleviated by laying down. She states her pain is exacerbated by movement and while taking a deep breath. Pt has taken 800 ibuprofen tid since the onset of symptoms; reports no relief. Pt denies any fever, numbness or tingling, bowel or bladder symptoms. Pt denies Hx of Cancer. Pt denies Hx of IV drug use.     The history is provided by the patient. No language interpreter was used.    Past Medical History:  Diagnosis Date  . Asthma   . Bronchitis   . Coronary artery disease   . High cholesterol   . Shoulder dislocation     There are no active problems to display for this patient.   Past Surgical History:  Procedure Laterality Date  . TUBAL LIGATION      OB History    No data available       Home Medications    Prior to Admission medications   Medication Sig Start Date End Date Taking? Authorizing Provider  azithromycin (ZITHROMAX) 250 MG tablet Take 2 tabs PO x 1 dose, then 1 tab PO QD x 4 days 02/07/16   Collie Siad English, PA  benzonatate  (TESSALON) 100 MG capsule Take 1 capsule (100 mg total) by mouth every 8 (eight) hours. 02/04/16   Ace Gins Sam, PA-C  chlorpheniramine-HYDROcodone (TUSSIONEX PENNKINETIC ER) 10-8 MG/5ML SUER Take 5 mLs by mouth at bedtime as needed. 02/07/16   Collie Siad English, PA  cyclobenzaprine (FLEXERIL) 5 MG tablet Take 1 tablet (5 mg total) by mouth 3 (three) times daily as needed for muscle spasms. Patient not taking: Reported on 02/07/2016 10/06/14   Charlynne Pander, MD  guaiFENesin (MUCINEX) 600 MG 12 hr tablet Take 1 tablet (600 mg total) by mouth 2 (two) times daily as needed for to loosen phlegm. 02/04/16   Ace Gins Sam, PA-C  guaiFENesin (ROBITUSSIN) 100 MG/5ML SOLN Take 5 mLs (100 mg total) by mouth every 6 (six) hours as needed for cough or to loosen phlegm. Patient not taking: Reported on 02/07/2016 10/06/14   Charlynne Pander, MD  ibuprofen (ADVIL,MOTRIN) 800 MG tablet Take 1 tablet (800 mg total) by mouth every 8 (eight) hours as needed for mild pain or moderate pain. 01/21/16   Trixie Dredge, PA-C  ibuprofen (ADVIL,MOTRIN) 800 MG tablet Take 1 tablet (800 mg total) by mouth 3 (three) times daily. Patient not taking: Reported on 02/07/2016 02/04/16   Ace Gins Sam, PA-C  ipratropium-albuterol (DUONEB) 0.5-2.5 (3) MG/3ML SOLN Take 3 mLs by nebulization every 6 (six) hours as  needed. 02/07/16   Collie Siad English, PA  omeprazole (PRILOSEC) 20 MG capsule Take 1 capsule (20 mg total) by mouth daily. Patient not taking: Reported on 02/07/2016 10/06/14   Charlynne Pander, MD  ondansetron (ZOFRAN) 4 MG tablet Take 1 tablet (4 mg total) by mouth every 8 (eight) hours as needed for nausea or vomiting. Patient not taking: Reported on 02/07/2016 01/21/16   Trixie Dredge, PA-C  predniSONE (DELTASONE) 20 MG tablet Take 2 tablets (40 mg total) by mouth daily. 02/04/16   Carlene Coria, PA-C    Family History Family History  Problem Relation Age of Onset  . Mental illness Mother   . Hyperlipidemia Mother   . Hypertension  Sister     Social History Social History  Substance Use Topics  . Smoking status: Current Some Day Smoker    Types: Cigars  . Smokeless tobacco: Never Used  . Alcohol use Yes     Comment: occasionally     Allergies   Review of patient's allergies indicates no known allergies.   Review of Systems Review of Systems  Constitutional: Negative for fever.  Gastrointestinal: Negative for abdominal pain.  Genitourinary: Negative for difficulty urinating, dysuria and frequency.  Musculoskeletal: Positive for back pain.  Neurological: Negative for weakness and numbness.  All other systems reviewed and are negative.  Physical Exam Updated Vital Signs BP 129/85 (BP Location: Left Arm)   Pulse 88   Temp 98.1 F (36.7 C) (Oral)   Resp 18   SpO2 100%   Physical Exam  Constitutional: She is oriented to person, place, and time. She appears well-developed and well-nourished. No distress.  HENT:  Head: Normocephalic and atraumatic.  Neck: Normal range of motion.  Pulmonary/Chest: Effort normal.  Musculoskeletal:  Inspection: No masses, deformity, or rash Palpation: No midline spinal tenderness. No paraspinal muscle tenderness. ROM: Normal flexion, extension, lateral rotation and flexion of back.  Strength: 5/5 in lower extremities and normal plantar and dorsiflexion Sensation: Intact sensation with light touch in lower extremities bilaterally Gait: Normal gait Reflexes: Patellar reflex is 2+ bilaterally, Achilles is 2+ bilaterally SLR: Negative seated straight leg raise   Neurological: She is alert and oriented to person, place, and time.  Skin: Skin is warm and dry. She is not diaphoretic.  Psychiatric: She has a normal mood and affect. Judgment normal.  Nursing note and vitals reviewed.    ED Treatments / Results   Procedures Procedures  DIAGNOSTIC STUDIES: Oxygen Saturation is 100% on RA, normal by my interpretation.  COORDINATION OF CARE: 5:10 PM-Will order  medication. Discussed treatment plan with pt at bedside and pt agreed to plan.   Medications Ordered in ED Medications  ketorolac (TORADOL) injection 60 mg (not administered)    Initial Impression / Assessment and Plan / ED Course  I have reviewed the triage vital signs and the nursing notes.  Pertinent labs & imaging results that were available during my care of the patient were reviewed by me and considered in my medical decision making (see chart for details).  Clinical Course   50 year old female with back pain most likely due to muscle strain. No red flags on history or back exam. Patient is ambulatory. No imaging indicated at this time as pain is atraumatic. Shot of toradol given here in ED with minimal relief. Discussed to continue Ibuprofen at home and will rx short course of pain medicine as well. Patient is NAD, non-toxic, with stable VS. Patient is informed of clinical course,  understands medical decision making process, and agrees with plan. Opportunity for questions provided and all questions answered. Return precautions given.   I personally performed the services described in this documentation, which was scribed in my presence. The recorded information has been reviewed and is accurate.  Final Clinical Impressions(s) / ED Diagnoses   Final diagnoses:  Muscle strain    New Prescriptions Discharge Medication List as of 06/14/2016  6:22 PM    START taking these medications   Details  HYDROcodone-acetaminophen (NORCO/VICODIN) 5-325 MG tablet Take 2 tablets by mouth every 4 (four) hours as needed., Starting Wed 06/14/2016, Print           Bethel Born, PA-C 06/14/16 2041    Zadie Rhine, MD 06/14/16 [phone]

## 2016-06-14 NOTE — ED Notes (Signed)
Pt is upset upon d/c rt only receiving 10 pills of norco. Pt informed she will need to f/u with [facility]. Pt is in stable condition upon d/c and ambulates from ED.

## 2016-09-13 ENCOUNTER — Emergency Department (HOSPITAL_COMMUNITY): Payer: Self-pay

## 2016-09-13 ENCOUNTER — Encounter (HOSPITAL_COMMUNITY): Payer: Self-pay

## 2016-09-13 ENCOUNTER — Emergency Department (HOSPITAL_COMMUNITY)
Admission: EM | Admit: 2016-09-13 | Discharge: 2016-09-14 | Disposition: A | Discharge status: Home / Self Care | Payer: Self-pay | Attending: Emergency Medicine | Admitting: Emergency Medicine

## 2016-09-13 DIAGNOSIS — J45909 Unspecified asthma, uncomplicated: Secondary | ICD-10-CM | POA: Insufficient documentation

## 2016-09-13 DIAGNOSIS — I2510 Atherosclerotic heart disease of native coronary artery without angina pectoris: Secondary | ICD-10-CM | POA: Insufficient documentation

## 2016-09-13 DIAGNOSIS — K219 Gastro-esophageal reflux disease without esophagitis: Secondary | ICD-10-CM | POA: Insufficient documentation

## 2016-09-13 DIAGNOSIS — R0789 Other chest pain: Secondary | ICD-10-CM

## 2016-09-13 DIAGNOSIS — F17290 Nicotine dependence, other tobacco product, uncomplicated: Secondary | ICD-10-CM | POA: Insufficient documentation

## 2016-09-13 LAB — BASIC METABOLIC PANEL
Anion gap: 8 (ref 5–15)
BUN: 15 mg/dL (ref 6–20)
CHLORIDE: 107 mmol/L (ref 101–111)
CO2: 25 mmol/L (ref 22–32)
CREATININE: 0.77 mg/dL (ref 0.44–1.00)
Calcium: 9.3 mg/dL (ref 8.9–10.3)
GFR calc Af Amer: >60 mL/min (ref >60)
GFR calc non Af Amer: >60 mL/min (ref >60)
GLUCOSE: 104 mg/dL — AB (ref 65–99)
POTASSIUM: 3.4 mmol/L — AB (ref 3.5–5.1)
SODIUM: 140 mmol/L (ref 135–145)

## 2016-09-13 LAB — CBC
HEMATOCRIT: 41.3 % (ref 36.0–46.0)
Hemoglobin: 13.6 g/dL (ref 12.0–15.0)
MCH: 30 pg (ref 26.0–34.0)
MCHC: 32.9 g/dL (ref 30.0–36.0)
MCV: 91.2 fL (ref 78.0–100.0)
PLATELETS: 295 10*3/uL (ref 150–400)
RBC: 4.53 MIL/uL (ref 3.87–5.11)
RDW: 14.1 % (ref 11.5–15.5)
WBC: 8.9 10*3/uL (ref 4.0–10.5)

## 2016-09-13 LAB — I-STAT TROPONIN, ED
Troponin i, poc: 0.01 ng/mL (ref 0.00–0.08)
Troponin i, poc: 0 ng/mL (ref 0.00–0.08)

## 2016-09-13 MED ORDER — GI COCKTAIL ~~LOC~~
30.0000 mL | Freq: Once | ORAL | Status: AC
  Administered 2016-09-13: 30 mL via ORAL
  Filled 2016-09-13: qty 30

## 2016-09-13 MED ORDER — FAMOTIDINE 20 MG PO TABS
20.0000 mg | ORAL_TABLET | Freq: Once | ORAL | Status: AC
  Administered 2016-09-13: 20 mg via ORAL
  Filled 2016-09-13: qty 1

## 2016-09-13 NOTE — ED Provider Notes (Signed)
5:05 PM Troponin negative. D/c per prior plan with Dr. Denton LankSteinl. Patient updated, f/u with PCP, return if symptoms worsen   Pricilla LovelessScott Estanislao Harmon, MD 09/13/16 1705

## 2016-09-13 NOTE — Discharge Instructions (Signed)
It was our pleasure to provide your ER care today - we hope that you feel better.  Take pepcid or zantac as need. You may also try maalox, mylanta, or gas-x as need for symptom relief.  For chest pain, follow up with cardiologist in the next 1-2 weeks - see referral - call office to arrange appointment.  Return to ER if worse, new symptoms, trouble breathing, recurrent or persistent chest pain, other concern.

## 2016-09-13 NOTE — ED Notes (Signed)
Pt a little warmer.  Pt asking for her temp tio be checked  Done  phoine given  She reports that she still has a bubbling in her ribs that has been there since this started.  Chest xray resulted

## 2016-09-13 NOTE — ED Notes (Signed)
Patient c/o epigastric pain onset this am while at work states she vomited x 2 without relief of pain. States she has a history of reflux.

## 2016-09-13 NOTE — ED Provider Notes (Signed)
[facility]-EMER[facility]ENCY DEPT Provider Note   CSN: [id number] Arrival date & time: 09/13/16  1214     History   Chief Complaint Chief Complaint  Patient presents with  . Chest Pain    HPI Angelica Miller is a 50 y.o. female.  Patient c/o midline, lower sternal/xiphoid area chest pain that started 3-4 hours ago at rest. Dull, moderate, non radiating. States felt like indigestion, has hx gerd, and states felt like she had to burp but couldn't. Then did have a couple episodes of emesis, which was not bloody or bilious. Denies abd pain. Had normal bm today. No other recent cp or discomfort, and no exertional cp or discomfort. No unusual doe or fatigue. No associated diaphoresis or sob. Pain is not pleuritic. No hx cad or fam hx premature cad. +smoker. No drug use. Denies leg pain or swelling. No recent immobility, surgery, trauma or travel. Occasional non prod cough. No fever or chills.    The history is provided by the patient.  Chest Pain   Associated symptoms include cough, nausea and vomiting. Pertinent negatives include no abdominal pain, no back pain, no fever, no headaches and no shortness of breath.    Past Medical History:  Diagnosis Date  . Asthma   . Bronchitis   . Coronary artery disease   . High cholesterol   . Shoulder dislocation     There are no active problems to display for this patient.   Past Surgical History:  Procedure Laterality Date  . TUBAL LIGATION      OB History    No data available       Home Medications    Prior to Admission medications   Medication Sig Start Date End Date Taking? Authorizing Provider  azithromycin (ZITHROMAX) 250 MG tablet Take 2 tabs PO x 1 dose, then 1 tab PO QD x 4 days 02/07/16   Collie Siad English, PA  benzonatate (TESSALON) 100 MG capsule Take 1 capsule (100 mg total) by mouth every 8 (eight) hours. 02/04/16   Ace Gins Sam, PA-C  chlorpheniramine-HYDROcodone (TUSSIONEX PENNKINETIC ER) 10-8 MG/5ML SUER Take 5 mLs by mouth  at bedtime as needed. 02/07/16   Collie Siad English, PA  cyclobenzaprine (FLEXERIL) 10 MG tablet Take 1 tablet (10 mg total) by mouth at bedtime. 06/14/16   Bethel Born, PA-C  HYDROcodone-acetaminophen (NORCO/VICODIN) 5-325 MG tablet Take 2 tablets by mouth every 4 (four) hours as needed. 06/14/16   Bethel Born, PA-C  ibuprofen (ADVIL,MOTRIN) 600 MG tablet Take 1 tablet (600 mg total) by mouth every 6 (six) hours as needed. 06/14/16   Bethel Born, PA-C  ipratropium-albuterol (DUONEB) 0.5-2.5 (3) MG/3ML SOLN Take 3 mLs by nebulization every 6 (six) hours as needed. 02/07/16   Collie Siad English, PA  predniSONE (DELTASONE) 20 MG tablet Take 2 tablets (40 mg total) by mouth daily. 02/04/16   Carlene Coria, PA-C    Family History Family History  Problem Relation Age of Onset  . Mental illness Mother   . Hyperlipidemia Mother   . Hypertension Sister     Social History Social History  Substance Use Topics  . Smoking status: Current Some Day Smoker    Types: Cigars  . Smokeless tobacco: Never Used  . Alcohol use Yes     Comment: occasionally     Allergies   Review of patient's allergies indicates no known allergies.   Review of Systems Review of Systems  Constitutional: Negative for chills and fever.  HENT:  Negative for sore throat.   Eyes: Negative for redness.  Respiratory: Positive for cough. Negative for shortness of breath.   Cardiovascular: Positive for chest pain.  Gastrointestinal: Positive for nausea and vomiting. Negative for abdominal pain.  Genitourinary: Negative for flank pain.  Musculoskeletal: Negative for back pain and neck pain.  Skin: Negative for rash.  Neurological: Negative for headaches.  Hematological: Does not bruise/bleed easily.  Psychiatric/Behavioral: Negative for confusion.     Physical Exam Updated Vital Signs BP 139/81 (BP Location: Right Arm)   Pulse 80   Temp 98.2 F (36.8 C) (Oral)   Resp 18   Ht 5\' 2"  (1.575 m)   Wt  82.6 kg   SpO2 100%   BMI 33.29 kg/m   Physical Exam  Constitutional: She appears well-developed and well-nourished. No distress.  HENT:  Mouth/Throat: Oropharynx is clear and moist.  Eyes: Conjunctivae are normal. No scleral icterus.  Neck: Neck supple. No tracheal deviation present.  Cardiovascular: Normal rate, regular rhythm, normal heart sounds and intact distal pulses.  Exam reveals no gallop and no friction rub.   No murmur heard. Pulmonary/Chest: Effort normal and breath sounds normal. No respiratory distress. She exhibits tenderness.  Midline chest wall tenderness reproducing symptoms.   Abdominal: Soft. Normal appearance. She exhibits no distension. There is no tenderness.  Musculoskeletal: She exhibits no edema or tenderness.  Neurological: She is alert.  Skin: Skin is warm and dry. No rash noted. She is not diaphoretic.  Psychiatric: She has a normal mood and affect.  Nursing note and vitals reviewed.    ED Treatments / Results  Labs (all labs ordered are listed, but only abnormal results are displayed) Results for orders placed or performed during the hospital encounter of 09/13/16  Basic metabolic panel  Result Value Ref Range   Sodium 140 135 - 145 mmol/L   Potassium 3.4 (L) 3.5 - 5.1 mmol/L   Chloride 107 101 - 111 mmol/L   CO2 25 22 - 32 mmol/L   Glucose, Bld 104 (H) 65 - 99 mg/dL   BUN 15 6 - 20 mg/dL   Creatinine, Ser 9.600.77 0.44 - 1.00 mg/dL   Calcium 9.3 8.9 - 45.410.3 mg/dL   GFR calc non Af Amer >60 >60 mL/min   GFR calc Af Amer >60 >60 mL/min   Anion gap 8 5 - 15  CBC  Result Value Ref Range   WBC 8.9 4.0 - 10.5 K/uL   RBC 4.53 3.87 - 5.11 MIL/uL   Hemoglobin 13.6 12.0 - 15.0 g/dL   HCT 09.841.3 11.936.0 - 14.746.0 %   MCV 91.2 78.0 - 100.0 fL   MCH 30.0 26.0 - 34.0 pg   MCHC 32.9 30.0 - 36.0 g/dL   RDW 82.914.1 56.211.5 - 13.015.5 %   Platelets 295 150 - 400 K/uL  I-stat troponin, ED  Result Value Ref Range   Troponin i, poc 0.01 0.00 - 0.08 ng/mL   Comment 3            Dg Chest 2 View  Result Date: 09/13/2016 CLINICAL DATA:  Chest pressure.  Shortness of breath . EXAM: CHEST  2 VIEW COMPARISON:  02/03/2016 . FINDINGS: Mediastinum hilar structures normal. Lungs are clear of acute infiltrates. Mild left base subsegmental atelectasis. No pleural effusion or pneumothorax. IMPRESSION: Mild left base subsegmental atelectasis. Electronically Signed   By: Maisie Fushomas  Register   On: 09/13/2016 13:11    EKG  EKG Interpretation  Date/Time:  Wednesday September 13 2016 12:19:14 EDT  Ventricular Rate:  80 PR Interval:  146 QRS Duration: 72 QT Interval:  380 QTC Calculation: 438 R Axis:   44 Text Interpretation:  Normal sinus rhythm No significant change since last tracing Confirmed by Denton Lank  MD, Caryn Bee ([id number]) on 09/13/2016 12:30:47 PM       Radiology Dg Chest 2 View  Result Date: 09/13/2016 CLINICAL DATA:  Chest pressure.  Shortness of breath . EXAM: CHEST  2 VIEW COMPARISON:  02/03/2016 . FINDINGS: Mediastinum hilar structures normal. Lungs are clear of acute infiltrates. Mild left base subsegmental atelectasis. No pleural effusion or pneumothorax. IMPRESSION: Mild left base subsegmental atelectasis. Electronically Signed   By: Maisie Fus  Register   On: 09/13/2016 13:11    Procedures Procedures (including critical care time)  Medications Ordered in ED Medications - No data to display   Initial Impression / Assessment and Plan / ED Course  I have reviewed the triage vital signs and the nursing notes.  Pertinent labs & imaging results that were available during my care of the patient were reviewed by me and considered in my medical decision making (see chart for details).  Clinical Course    Iv ns. Ecg. Cxr. Labs.  Hx gerd.   pepcid and gi cocktail for symptom relief.  Reviewed records from [facility] - pt has been seen there w cp.  Notes indicate initial ecg felt c/w mobitz 2, but that subsequently doctor there felt was 1st degree. Stress  test and echo done then negative.  Reviewed nursing notes and prior charts for additional history.   Initial trop neg.  Delta troponin pending - will sign out to Dr Criss Alvine at 1600 to check delta trop, if neg, and now new c/o, would plan for d/c w outpt cardiology f/u.     Final Clinical Impressions(s) / ED Diagnoses   Final diagnoses:  None    New Prescriptions New Prescriptions   No medications on file     Cathren Laine, MD 09/13/16 1554

## 2016-09-13 NOTE — ED Notes (Signed)
Pt c/o being cold blanket given thermostat adjusted

## 2016-09-13 NOTE — ED Notes (Signed)
States she feels  A little better .

## 2016-09-13 NOTE — ED Triage Notes (Signed)
Patient complains of SSCP for the past hour after she vomited x 2 while at work, states ongoing nausea with same. Non-radiating and describes as sharp pain

## 2016-09-13 NOTE — ED Notes (Signed)
Transported to xray 

## 2016-11-16 ENCOUNTER — Encounter: Payer: Self-pay | Admitting: Internal Medicine

## 2016-11-16 ENCOUNTER — Ambulatory Visit (INDEPENDENT_AMBULATORY_CARE_PROVIDER_SITE_OTHER): Payer: Self-pay | Admitting: Internal Medicine

## 2016-11-16 VITALS — BP 122/84 | HR 68 | Temp 98.3°F | Resp 12 | Ht 61.0 in | Wt 182.0 lb

## 2016-11-16 DIAGNOSIS — E7800 Pure hypercholesterolemia, unspecified: Secondary | ICD-10-CM | POA: Insufficient documentation

## 2016-11-16 DIAGNOSIS — I441 Atrioventricular block, second degree: Secondary | ICD-10-CM

## 2016-11-16 DIAGNOSIS — J0140 Acute pansinusitis, unspecified: Secondary | ICD-10-CM

## 2016-11-16 DIAGNOSIS — Z9149 Other personal history of psychological trauma, not elsewhere classified: Secondary | ICD-10-CM

## 2016-11-16 DIAGNOSIS — J40 Bronchitis, not specified as acute or chronic: Secondary | ICD-10-CM | POA: Insufficient documentation

## 2016-11-16 DIAGNOSIS — M546 Pain in thoracic spine: Secondary | ICD-10-CM

## 2016-11-16 DIAGNOSIS — E785 Hyperlipidemia, unspecified: Secondary | ICD-10-CM | POA: Insufficient documentation

## 2016-11-16 DIAGNOSIS — K029 Dental caries, unspecified: Secondary | ICD-10-CM

## 2016-11-16 LAB — POCT URINALYSIS DIPSTICK
Bilirubin, UA: NEGATIVE
Glucose, UA: NEGATIVE
Ketones, UA: NEGATIVE
Nitrite, UA: NEGATIVE
PH UA: 7
PROTEIN UA: 15
RBC UA: NEGATIVE
SPEC GRAV UA: 1.02
UROBILINOGEN UA: 0.2

## 2016-11-16 MED ORDER — CETIRIZINE HCL 10 MG PO TABS: 10.0000 mg | ORAL_TABLET | Freq: Every day | ORAL | 11 refills | Status: DC

## 2016-11-16 MED ORDER — AZITHROMYCIN 250 MG PO TABS: ORAL_TABLET | ORAL | 0 refills | Status: DC
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
## 2016-11-17 ENCOUNTER — Telehealth: Payer: Self-pay | Admitting: Licensed Clinical Social Worker

## 2016-11-17 NOTE — Telephone Encounter (Signed)
LCSW called pt to schedule counseling session. Session scheduled with social work intern [employer] for 01/10.

## 2016-11-29 ENCOUNTER — Other Ambulatory Visit: Payer: Self-pay | Admitting: Licensed Clinical Social Worker

## 2016-12-07 ENCOUNTER — Other Ambulatory Visit: Payer: Self-pay | Admitting: Licensed Clinical Social Worker

## 2016-12-14 ENCOUNTER — Other Ambulatory Visit: Payer: Self-pay | Admitting: Licensed Clinical Social Worker

## 2016-12-20 ENCOUNTER — Encounter (HOSPITAL_COMMUNITY): Payer: Self-pay | Admitting: Emergency Medicine

## 2016-12-20 ENCOUNTER — Emergency Department (HOSPITAL_COMMUNITY)
Admission: EM | Admit: 2016-12-20 | Discharge: 2016-12-20 | Disposition: A | Discharge status: Home / Self Care | Payer: Self-pay | Attending: Emergency Medicine | Admitting: Emergency Medicine

## 2016-12-20 ENCOUNTER — Emergency Department (HOSPITAL_COMMUNITY): Payer: Self-pay

## 2016-12-20 ENCOUNTER — Telehealth: Payer: Self-pay | Admitting: Internal Medicine

## 2016-12-20 DIAGNOSIS — F17290 Nicotine dependence, other tobacco product, uncomplicated: Secondary | ICD-10-CM | POA: Insufficient documentation

## 2016-12-20 DIAGNOSIS — Z7982 Long term (current) use of aspirin: Secondary | ICD-10-CM | POA: Insufficient documentation

## 2016-12-20 DIAGNOSIS — J45909 Unspecified asthma, uncomplicated: Secondary | ICD-10-CM | POA: Insufficient documentation

## 2016-12-20 DIAGNOSIS — J111 Influenza due to unidentified influenza virus with other respiratory manifestations: Secondary | ICD-10-CM | POA: Insufficient documentation

## 2016-12-20 DIAGNOSIS — R69 Illness, unspecified: Secondary | ICD-10-CM

## 2016-12-20 MED ORDER — BENZONATATE 100 MG PO CAPS: 200.0000 mg | ORAL_CAPSULE | Freq: Two times a day (BID) | ORAL | 0 refills | Status: DC | PRN

## 2016-12-20 MED ORDER — OXYMETAZOLINE HCL 0.05 % NA SOLN
1.0000 | Freq: Two times a day (BID) | NASAL | 0 refills | Status: DC
Start: 2016-12-20 — End: 2017-02-27

## 2016-12-20 NOTE — Telephone Encounter (Signed)
No notes made for encounter.

## 2016-12-20 NOTE — Discharge Instructions (Signed)
Take your medications as prescribed. I also recommend taking Tylenol and ibuprofen as prescribed over-the-counter, oximetry in between doses every 3-4 hours. Continue drinking fluids at home to remain hydrated. I recommend eating a bland diet for the next few days and taper symptoms have improved. °Follow-up with your primary care provider in the next 3-4 days if symptoms have not improved. °Return to the emergency department if symptoms worsen or new onset of headache, neck stiffness, difficulty breathing, coughing up blood, chest pain, abdominal pain, vomiting, unable to keep fluids down.  °
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
## 2016-12-20 NOTE — ED Triage Notes (Signed)
Has been coughing for 2 days-- low grade temp also, body aches-- denies being around anyone with flu. Has been using treatments at home and inhalers.

## 2016-12-21 ENCOUNTER — Other Ambulatory Visit: Payer: Self-pay | Admitting: Licensed Clinical Social Worker

## 2017-01-26 ENCOUNTER — Telehealth: Payer: Self-pay | Admitting: Internal Medicine

## 2017-02-27 ENCOUNTER — Encounter: Payer: Self-pay | Admitting: Internal Medicine

## 2017-02-27 ENCOUNTER — Ambulatory Visit (INDEPENDENT_AMBULATORY_CARE_PROVIDER_SITE_OTHER): Payer: Self-pay | Admitting: Internal Medicine

## 2017-02-27 VITALS — BP 130/80 | HR 86 | Resp 12 | Ht 61.5 in | Wt 185.0 lb

## 2017-02-27 DIAGNOSIS — Z124 Encounter for screening for malignant neoplasm of cervix: Secondary | ICD-10-CM

## 2017-02-27 DIAGNOSIS — Z1231 Encounter for screening mammogram for malignant neoplasm of breast: Secondary | ICD-10-CM

## 2017-02-27 DIAGNOSIS — K029 Dental caries, unspecified: Secondary | ICD-10-CM

## 2017-02-27 DIAGNOSIS — J45909 Unspecified asthma, uncomplicated: Secondary | ICD-10-CM

## 2017-02-27 DIAGNOSIS — F341 Dysthymic disorder: Secondary | ICD-10-CM

## 2017-02-27 DIAGNOSIS — Z23 Encounter for immunization: Secondary | ICD-10-CM

## 2017-02-27 DIAGNOSIS — Z0000 Encounter for general adult medical examination without abnormal findings: Secondary | ICD-10-CM

## 2017-02-27 DIAGNOSIS — Z1239 Encounter for other screening for malignant neoplasm of breast: Secondary | ICD-10-CM

## 2017-02-27 MED ORDER — FEXOFENADINE HCL 180 MG PO TABS: 180.0000 mg | ORAL_TABLET | Freq: Every day | ORAL | Status: DC

## 2017-02-27 MED ORDER — MONTELUKAST SODIUM 10 MG PO TABS: 10.0000 mg | ORAL_TABLET | Freq: Every day | ORAL | 11 refills | Status: DC

## 2017-02-27 NOTE — Patient Instructions (Signed)
Can google "advance directives, [place]"  And bring up form from Secretary of State. Print and fill out Or can go to "5 wishes"  Which is also in Spanish and fill out--this costs $5--perhaps easier to use. Designate a Medical Power of Attorney to speak for you if you are unable to speak for yourself when ill or injured  
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
## 2017-02-28 LAB — CYTOLOGY - PAP

## 2017-03-01 LAB — GC/CHLAMYDIA PROBE AMP
CHLAMYDIA, DNA PROBE: NEGATIVE
NEISSERIA GONORRHOEAE BY PCR: NEGATIVE

## 2017-03-02 ENCOUNTER — Telehealth: Payer: Self-pay | Admitting: Licensed Clinical Social Worker

## 2017-03-02 NOTE — Telephone Encounter (Signed)
LCSW called pt to schedule appt after referral from Dr Delrae Alfred. Left VM.

## 2017-03-14 ENCOUNTER — Other Ambulatory Visit: Payer: Self-pay | Admitting: Internal Medicine

## 2017-03-14 DIAGNOSIS — Z1231 Encounter for screening mammogram for malignant neoplasm of breast: Secondary | ICD-10-CM

## 2017-03-30 ENCOUNTER — Ambulatory Visit
Admission: RE | Admit: 2017-03-30 | Discharge: 2017-03-30 | Disposition: A | Discharge status: Home / Self Care | Payer: No Typology Code available for payment source | Source: Ambulatory Visit | Attending: Internal Medicine | Admitting: Internal Medicine

## 2017-03-30 DIAGNOSIS — Z1231 Encounter for screening mammogram for malignant neoplasm of breast: Secondary | ICD-10-CM

## 2017-04-13 ENCOUNTER — Ambulatory Visit: Payer: Self-pay | Admitting: Internal Medicine

## 2017-04-19 ENCOUNTER — Ambulatory Visit: Payer: Self-pay | Admitting: Internal Medicine

## 2017-04-26 ENCOUNTER — Telehealth: Payer: Self-pay | Admitting: Internal Medicine

## 2017-04-26 NOTE — Telephone Encounter (Signed)
To Dr. Mulberry for further direction 

## 2017-04-30 ENCOUNTER — Ambulatory Visit: Payer: Self-pay | Admitting: Internal Medicine

## 2017-05-01 ENCOUNTER — Emergency Department (HOSPITAL_COMMUNITY)
Admission: EM | Admit: 2017-05-01 | Discharge: 2017-05-01 | Disposition: A | Discharge status: Home / Self Care | Payer: No Typology Code available for payment source | Attending: Emergency Medicine | Admitting: Emergency Medicine

## 2017-05-01 ENCOUNTER — Encounter (HOSPITAL_COMMUNITY): Payer: Self-pay

## 2017-05-01 DIAGNOSIS — K0889 Other specified disorders of teeth and supporting structures: Secondary | ICD-10-CM

## 2017-05-01 DIAGNOSIS — Z87891 Personal history of nicotine dependence: Secondary | ICD-10-CM | POA: Insufficient documentation

## 2017-05-01 DIAGNOSIS — M545 Low back pain, unspecified: Secondary | ICD-10-CM

## 2017-05-01 DIAGNOSIS — R197 Diarrhea, unspecified: Secondary | ICD-10-CM

## 2017-05-01 DIAGNOSIS — Z7982 Long term (current) use of aspirin: Secondary | ICD-10-CM | POA: Insufficient documentation

## 2017-05-01 DIAGNOSIS — J45909 Unspecified asthma, uncomplicated: Secondary | ICD-10-CM | POA: Insufficient documentation

## 2017-05-01 MED ORDER — PENICILLIN V POTASSIUM 250 MG PO TABS: 250.0000 mg | ORAL_TABLET | Freq: Four times a day (QID) | ORAL | 0 refills | Status: AC
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
## 2017-05-01 NOTE — ED Triage Notes (Signed)
Per Pt, Pt reports toothache to the left lower mouth that started a month ago. Pt reports lower back pain with hx of the same. Generalized fatigue. Reports cough and congestion, ear ache on the left side. Denies vomiting or diarrhea.

## 2017-05-07 ENCOUNTER — Telehealth: Payer: Self-pay | Admitting: Internal Medicine

## 2017-05-07 NOTE — Telephone Encounter (Signed)
Looks like patient was seen in ER on 05/01/17. To Dr. Delrae AlfredMulberry for further direction.

## 2017-05-07 NOTE — Telephone Encounter (Signed)
She needs to be seen here before we make a change.

## 2017-05-07 NOTE — Telephone Encounter (Signed)
Patient called Rx is breaking her out and she is not going to take anymore of the Rx.  Penicillin v potassium (VEETID) 250 mg.  Patient would like another Rx. Called in for her.

## 2017-05-08 NOTE — Telephone Encounter (Signed)
To Mrs. Pat to schedule appointment for patient

## 2017-05-11 ENCOUNTER — Ambulatory Visit: Payer: Self-pay | Admitting: Internal Medicine

## 2017-05-16 ENCOUNTER — Ambulatory Visit: Payer: Self-pay | Admitting: Internal Medicine

## 2017-05-22 ENCOUNTER — Ambulatory Visit: Payer: Self-pay | Admitting: Internal Medicine

## 2017-05-31 ENCOUNTER — Emergency Department (HOSPITAL_COMMUNITY)
Admission: EM | Admit: 2017-05-31 | Discharge: 2017-05-31 | Disposition: A | Discharge status: Home / Self Care | Payer: No Typology Code available for payment source | Attending: Emergency Medicine | Admitting: Emergency Medicine

## 2017-05-31 ENCOUNTER — Encounter (HOSPITAL_COMMUNITY): Payer: Self-pay | Admitting: Emergency Medicine

## 2017-05-31 DIAGNOSIS — J45909 Unspecified asthma, uncomplicated: Secondary | ICD-10-CM | POA: Insufficient documentation

## 2017-05-31 DIAGNOSIS — G8929 Other chronic pain: Secondary | ICD-10-CM

## 2017-05-31 DIAGNOSIS — Z87891 Personal history of nicotine dependence: Secondary | ICD-10-CM | POA: Insufficient documentation

## 2017-05-31 DIAGNOSIS — K089 Disorder of teeth and supporting structures, unspecified: Secondary | ICD-10-CM | POA: Insufficient documentation

## 2017-05-31 DIAGNOSIS — Z880 Allergy status to penicillin: Secondary | ICD-10-CM | POA: Insufficient documentation

## 2017-05-31 DIAGNOSIS — Z79899 Other long term (current) drug therapy: Secondary | ICD-10-CM | POA: Insufficient documentation

## 2017-05-31 MED ORDER — IBUPROFEN 600 MG PO TABS: 600.0000 mg | ORAL_TABLET | Freq: Four times a day (QID) | ORAL | 0 refills | Status: DC | PRN

## 2017-05-31 MED ORDER — MAGIC MOUTHWASH W/LIDOCAINE: 5.0000 mL | Freq: Four times a day (QID) | ORAL | 0 refills | Status: DC | PRN
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
## 2017-05-31 NOTE — ED Triage Notes (Signed)
Pt sts left upper dental pain x months

## 2017-05-31 NOTE — Discharge Instructions (Signed)
Read the information below.  Use the prescribed medication as directed.  Please discuss all new medications with your pharmacist.  You may return to the Emergency Department at any time for worsening condition or any new symptoms that concern you.   Please call the dentist listed above within 48 hours to schedule a close follow up appointment.  If you develop fevers, swelling in your face, difficulty swallowing or breathing, return to the ER immediately for a recheck.   °

## 2017-06-13 NOTE — Telephone Encounter (Signed)
noted 

## 2017-06-13 NOTE — Telephone Encounter (Signed)
Patient was called, but [facility].  Is not working and cannot afford the $60.00 copay.  Patient will come by office and [facility] to re[facility] since income status has changed.

## 2017-06-18 ENCOUNTER — Emergency Department (HOSPITAL_COMMUNITY)
Admission: EM | Admit: 2017-06-18 | Discharge: 2017-06-18 | Disposition: A | Discharge status: Home / Self Care | Payer: Self-pay | Attending: Emergency Medicine | Admitting: Emergency Medicine

## 2017-06-18 ENCOUNTER — Emergency Department (HOSPITAL_COMMUNITY): Payer: Self-pay

## 2017-06-18 ENCOUNTER — Encounter (HOSPITAL_COMMUNITY): Payer: Self-pay

## 2017-06-18 DIAGNOSIS — Z87891 Personal history of nicotine dependence: Secondary | ICD-10-CM | POA: Insufficient documentation

## 2017-06-18 DIAGNOSIS — J441 Chronic obstructive pulmonary disease with (acute) exacerbation: Secondary | ICD-10-CM | POA: Insufficient documentation

## 2017-06-18 DIAGNOSIS — Z79899 Other long term (current) drug therapy: Secondary | ICD-10-CM | POA: Insufficient documentation

## 2017-06-18 MED ORDER — IPRATROPIUM-ALBUTEROL 0.5-2.5 (3) MG/3ML IN SOLN: 3.0000 mL | RESPIRATORY_TRACT | 0 refills | Status: DC | PRN

## 2017-06-18 MED ORDER — HYDROCODONE-HOMATROPINE 5-1.5 MG/5ML PO SYRP: 5.0000 mL | ORAL_SOLUTION | Freq: Four times a day (QID) | ORAL | 0 refills | Status: DC | PRN

## 2017-06-18 MED ORDER — PREDNISONE 20 MG PO TABS
60.0000 mg | ORAL_TABLET | Freq: Once | ORAL | Status: AC
  Administered 2017-06-18: 60 mg via ORAL
  Filled 2017-06-18: qty 3

## 2017-06-18 MED ORDER — IPRATROPIUM-ALBUTEROL 0.5-2.5 (3) MG/3ML IN SOLN
3.0000 mL | RESPIRATORY_TRACT | Status: DC
Start: 2017-06-18 — End: 2017-06-18
  Administered 2017-06-18: 3 mL via RESPIRATORY_TRACT
  Filled 2017-06-18: qty 3

## 2017-06-18 MED ORDER — ACETAMINOPHEN-CODEINE 120-12 MG/5ML PO SOLN
5.0000 mL | Freq: Once | ORAL | Status: AC
  Administered 2017-06-18: 5 mL via ORAL
  Filled 2017-06-18: qty 5

## 2017-06-18 MED ORDER — HYDROCODONE-HOMATROPINE 5-1.5 MG/5ML PO SYRP
5.0000 mL | ORAL_SOLUTION | Freq: Once | ORAL | Status: AC
  Administered 2017-06-18: 5 mL via ORAL
  Filled 2017-06-18: qty 5

## 2017-06-18 MED ORDER — AZITHROMYCIN 250 MG PO TABS: 250.0000 mg | ORAL_TABLET | Freq: Every day | ORAL | 0 refills | Status: DC

## 2017-06-18 MED ORDER — AZITHROMYCIN 250 MG PO TABS
500.0000 mg | ORAL_TABLET | Freq: Once | ORAL | Status: AC
  Administered 2017-06-18: 500 mg via ORAL
  Filled 2017-06-18: qty 2

## 2017-06-18 MED ORDER — PREDNISONE 20 MG PO TABS: ORAL_TABLET | ORAL | 0 refills | Status: DC
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
## 2017-06-18 NOTE — ED Notes (Signed)
Pt states understanding discharge instructions, pt stable for discharge. No signature pad

## 2017-06-18 NOTE — ED Triage Notes (Signed)
Pt hx of chronic bronchitis, sts has had congestion and cough for 4 days. sts unable to get anything up with cough. No relief with robitussin. Reports pain in ribs with cough .

## 2017-10-03 ENCOUNTER — Other Ambulatory Visit: Payer: Self-pay

## 2017-10-03 ENCOUNTER — Emergency Department (HOSPITAL_COMMUNITY)
Admission: EM | Admit: 2017-10-03 | Discharge: 2017-10-04 | Disposition: A | Discharge status: Home / Self Care | Payer: No Typology Code available for payment source | Attending: Emergency Medicine | Admitting: Emergency Medicine

## 2017-10-03 ENCOUNTER — Encounter (HOSPITAL_COMMUNITY): Payer: Self-pay | Admitting: Emergency Medicine

## 2017-10-03 DIAGNOSIS — N730 Acute parametritis and pelvic cellulitis: Secondary | ICD-10-CM

## 2017-10-03 DIAGNOSIS — Z87891 Personal history of nicotine dependence: Secondary | ICD-10-CM | POA: Insufficient documentation

## 2017-10-03 DIAGNOSIS — Z79899 Other long term (current) drug therapy: Secondary | ICD-10-CM | POA: Insufficient documentation

## 2017-10-03 DIAGNOSIS — J45909 Unspecified asthma, uncomplicated: Secondary | ICD-10-CM | POA: Insufficient documentation

## 2017-10-03 DIAGNOSIS — N739 Female pelvic inflammatory disease, unspecified: Secondary | ICD-10-CM | POA: Insufficient documentation

## 2017-10-03 LAB — COMPREHENSIVE METABOLIC PANEL
ALBUMIN: 3.9 g/dL (ref 3.5–5.0)
ALT: 13 U/L — ABNORMAL LOW (ref 14–54)
ANION GAP: 7 (ref 5–15)
AST: 19 U/L (ref 15–41)
Alkaline Phosphatase: 77 U/L (ref 38–126)
BILIRUBIN TOTAL: 0.5 mg/dL (ref 0.3–1.2)
BUN: 13 mg/dL (ref 6–20)
CHLORIDE: 106 mmol/L (ref 101–111)
CO2: 25 mmol/L (ref 22–32)
Calcium: 9.1 mg/dL (ref 8.9–10.3)
Creatinine, Ser: 0.7 mg/dL (ref 0.44–1.00)
GFR calc Af Amer: >60 mL/min (ref >60)
GFR calc non Af Amer: >60 mL/min (ref >60)
GLUCOSE: 96 mg/dL (ref 65–99)
POTASSIUM: 3.9 mmol/L (ref 3.5–5.1)
Sodium: 138 mmol/L (ref 135–145)
Total Protein: 7.9 g/dL (ref 6.5–8.1)

## 2017-10-03 LAB — WET PREP, GENITAL
SPERM: NONE SEEN
TRICH WET PREP: NONE SEEN
Yeast Wet Prep HPF POC: NONE SEEN

## 2017-10-03 LAB — URINALYSIS, ROUTINE W REFLEX MICROSCOPIC
BILIRUBIN URINE: NEGATIVE
GLUCOSE, UA: NEGATIVE mg/dL
Hgb urine dipstick: NEGATIVE
KETONES UR: NEGATIVE mg/dL
Leukocytes, UA: NEGATIVE
NITRITE: NEGATIVE
PH: 7 (ref 5.0–8.0)
Protein, ur: NEGATIVE mg/dL
SPECIFIC GRAVITY, URINE: 1.021 (ref 1.005–1.030)

## 2017-10-03 LAB — CBC
HEMATOCRIT: 43.3 % (ref 36.0–46.0)
HEMOGLOBIN: 14.6 g/dL (ref 12.0–15.0)
MCH: 31 pg (ref 26.0–34.0)
MCHC: 33.7 g/dL (ref 30.0–36.0)
MCV: 91.9 fL (ref 78.0–100.0)
Platelets: 286 10*3/uL (ref 150–400)
RBC: 4.71 MIL/uL (ref 3.87–5.11)
RDW: 13.1 % (ref 11.5–15.5)
WBC: 9.8 10*3/uL (ref 4.0–10.5)

## 2017-10-03 LAB — LIPASE, BLOOD: Lipase: 39 U/L (ref 11–51)

## 2017-10-03 MED ORDER — FENTANYL CITRATE (PF) 100 MCG/2ML IJ SOLN
50.0000 ug | Freq: Once | INTRAMUSCULAR | Status: AC
  Administered 2017-10-03: 50 ug via INTRAVENOUS
  Filled 2017-10-03: qty 2
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
## 2017-10-03 NOTE — ED Triage Notes (Signed)
Pt states she has been having abd pain for 3 days. Denies N/V, denies urinary symptoms.

## 2017-10-04 LAB — GC/CHLAMYDIA PROBE AMP (~~LOC~~) NOT AT ARMC
Chlamydia: NEGATIVE
Neisseria Gonorrhea: NEGATIVE

## 2017-10-04 MED ORDER — METRONIDAZOLE 500 MG PO TABS: 500.0000 mg | ORAL_TABLET | Freq: Two times a day (BID) | ORAL | 0 refills | Status: AC

## 2017-10-04 MED ORDER — LIDOCAINE HCL (PF) 1 % IJ SOLN
INTRAMUSCULAR | Status: AC
  Filled 2017-10-04: qty 5

## 2017-10-04 MED ORDER — CEFTRIAXONE SODIUM 250 MG IJ SOLR
250.0000 mg | Freq: Once | INTRAMUSCULAR | Status: AC
  Administered 2017-10-04: 250 mg via INTRAMUSCULAR
  Filled 2017-10-04: qty 250

## 2017-10-04 MED ORDER — DOXYCYCLINE HYCLATE 100 MG PO CAPS: 100.0000 mg | ORAL_CAPSULE | Freq: Two times a day (BID) | ORAL | 0 refills | Status: AC

## 2017-10-04 NOTE — Discharge Instructions (Signed)
Please read the instructions below.  [facility]. Finish your antibiotic (Flagyl/Metronidazole and Doxycycline) as prescribed. Do not drink alcohol with this medication as it will cause vomiting. It is also important that you protect your skin from the sun with taking Doxycycline, as it can make your skin sensitive. You can take advil/ibuprofen evert 6 hours as needed for pain. You will receive a call from the hospital if your test results come back positive. Avoid sexual activity until you know your test results. If your results come back positive, it is important that you inform all of your sexual partners. Return to the ER for new or worsening symptoms.

## 2017-12-08 ENCOUNTER — Emergency Department (HOSPITAL_COMMUNITY)
Admission: EM | Admit: 2017-12-08 | Discharge: 2017-12-08 | Discharge status: Against Medical Advice | Payer: Self-pay | Attending: Emergency Medicine | Admitting: Emergency Medicine

## 2017-12-08 ENCOUNTER — Encounter (HOSPITAL_COMMUNITY): Payer: Self-pay | Admitting: *Deleted

## 2017-12-08 DIAGNOSIS — R51 Headache: Secondary | ICD-10-CM | POA: Insufficient documentation

## 2017-12-08 DIAGNOSIS — Z5321 Procedure and treatment not carried out due to patient leaving prior to being seen by health care provider: Secondary | ICD-10-CM | POA: Insufficient documentation

## 2017-12-08 HISTORY — DX: Gastro-esophageal reflux disease with esophagitis, without bleeding: K21.00

## 2017-12-08 HISTORY — DX: Gastro-esophageal reflux disease with esophagitis: K21.0

## 2017-12-08 NOTE — ED Triage Notes (Signed)
To ED for eval of migraine for the past 2 days. She doesn't have any Excedrin Migraine, which normally helps. Photophobia, noise bothers pt, nausea. Speech clear with no neuro deficits noted. Ambulatory.

## 2017-12-18 ENCOUNTER — Other Ambulatory Visit: Payer: Self-pay

## 2017-12-18 ENCOUNTER — Emergency Department (HOSPITAL_COMMUNITY): Payer: Self-pay

## 2017-12-18 ENCOUNTER — Encounter (HOSPITAL_COMMUNITY): Payer: Self-pay | Admitting: Emergency Medicine

## 2017-12-18 DIAGNOSIS — J069 Acute upper respiratory infection, unspecified: Secondary | ICD-10-CM | POA: Insufficient documentation

## 2017-12-18 DIAGNOSIS — B9789 Other viral agents as the cause of diseases classified elsewhere: Secondary | ICD-10-CM | POA: Insufficient documentation

## 2017-12-18 DIAGNOSIS — J45909 Unspecified asthma, uncomplicated: Secondary | ICD-10-CM | POA: Insufficient documentation

## 2017-12-18 MED ORDER — ALBUTEROL SULFATE (2.5 MG/3ML) 0.083% IN NEBU
5.0000 mg | INHALATION_SOLUTION | Freq: Once | RESPIRATORY_TRACT | Status: AC
  Administered 2017-12-18: 5 mg via RESPIRATORY_TRACT
  Filled 2017-12-18: qty 6

## 2017-12-18 NOTE — ED Triage Notes (Signed)
Pt states she has asthma and it started acting up about an hour ago  Pt states she used her inhaler without relief

## 2017-12-19 ENCOUNTER — Emergency Department (HOSPITAL_COMMUNITY)
Admission: EM | Admit: 2017-12-19 | Discharge: 2017-12-19 | Disposition: A | Discharge status: Home / Self Care | Payer: Self-pay | Attending: Emergency Medicine | Admitting: Emergency Medicine

## 2017-12-19 DIAGNOSIS — J069 Acute upper respiratory infection, unspecified: Secondary | ICD-10-CM

## 2017-12-19 HISTORY — DX: Unspecified chronic bronchitis: J42

## 2017-12-19 MED ORDER — BENZONATATE 100 MG PO CAPS: 100.0000 mg | ORAL_CAPSULE | Freq: Three times a day (TID) | ORAL | 0 refills | Status: DC | PRN

## 2017-12-19 MED ORDER — BENZONATATE 100 MG PO CAPS
100.0000 mg | ORAL_CAPSULE | Freq: Once | ORAL | Status: AC
  Administered 2017-12-19: 100 mg via ORAL
  Filled 2017-12-19: qty 1

## 2017-12-19 MED ORDER — GUAIFENESIN ER 600 MG PO TB12
600.0000 mg | ORAL_TABLET | Freq: Once | ORAL | Status: AC
  Administered 2017-12-19: 600 mg via ORAL
  Filled 2017-12-19: qty 1

## 2017-12-19 MED ORDER — ALBUTEROL SULFATE HFA 108 (90 BASE) MCG/ACT IN AERS
2.0000 | INHALATION_SPRAY | Freq: Once | RESPIRATORY_TRACT | Status: AC
  Administered 2017-12-19: 2 via RESPIRATORY_TRACT
  Filled 2017-12-19: qty 6.7

## 2017-12-19 MED ORDER — PREDNISONE 20 MG PO TABS
60.0000 mg | ORAL_TABLET | Freq: Once | ORAL | Status: AC
  Administered 2017-12-19: 60 mg via ORAL
  Filled 2017-12-19: qty 3

## 2017-12-19 MED ORDER — PREDNISONE 20 MG PO TABS: 60.0000 mg | ORAL_TABLET | Freq: Every day | ORAL | 0 refills | Status: DC

## 2017-12-19 NOTE — ED Provider Notes (Signed)
TIME SEEN: 2:14 AM  CHIEF COMPLAINT: "I think I am having an asthma exacerbation"  HPI: Patient is a 52 year old female with history of asthma who presents to the emergency department with complaints of several days of fevers, cough, nasal congestion, shortness of breath and wheezing.  States that she thinks that she has an infection that is exacerbating her asthma.  Given a breathing treatment in the waiting room and reports feeling better.  States she does not think she can go to work today.  Denies nausea, vomiting or diarrhea.  No chest pain.  Did have an influenza vaccination this year.  Reports she has had recent sick contacts.  ROS: See HPI Constitutional:  fever  Eyes: no drainage  ENT:  runny nose   Cardiovascular:  no chest pain  Resp:  SOB  GI: no vomiting GU: no dysuria Integumentary: no rash  Allergy: no hives  Musculoskeletal: no leg swelling  Neurological: no slurred speech ROS otherwise negative  PAST MEDICAL HISTORY/PAST SURGICAL HISTORY:  Past Medical History:  Diagnosis Date  . Asthma   . Chronic bronchitis (HCC)   . Migraines   . Reflux esophagitis     MEDICATIONS:  Prior to Admission medications   Not on File    ALLERGIES:  No Known Allergies  SOCIAL HISTORY:  Social History   Tobacco Use  . Smoking status: Never Smoker  . Smokeless tobacco: Never Used  Substance Use Topics  . Alcohol use: No    Frequency: Never    FAMILY HISTORY: Family History  Problem Relation Age of Onset  . Cancer Other   . Hypertension Other   . Diabetes Other     EXAM: BP (!) 134/91 (BP Location: Left Arm)   Pulse 100   Temp 98.4 F (36.9 C) (Oral)   Resp 18   Ht 5\' 2"  (1.575 m)   Wt 84 kg (185 lb 1.6 oz)   SpO2 100%   BMI 33.86 kg/m  CONSTITUTIONAL: Alert and oriented and responds appropriately to questions. Well-appearing; well-nourished HEAD: Normocephalic EYES: Conjunctivae clear, pupils appear equal, EOMI ENT: normal nose; moist mucous  membranes NECK: Supple, no meningismus, no nuchal rigidity, no LAD  CARD: RRR; S1 and S2 appreciated; no murmurs, no clicks, no rubs, no gallops RESP: Normal chest excursion without splinting or tachypnea; breath sounds clear and equal bilaterally; no wheezes, no rhonchi, no rales, no hypoxia or respiratory distress, speaking full sentences ABD/GI: Normal bowel sounds; non-distended; soft, non-tender, no rebound, no guarding, no peritoneal signs, no hepatosplenomegaly BACK:  The back appears normal and is non-tender to palpation, there is no CVA tenderness EXT: Normal ROM in all joints; non-tender to palpation; no edema; normal capillary refill; no cyanosis, no calf tenderness or swelling    SKIN: Normal color for age and race; warm; no rash NEURO: Moves all extremities equally PSYCH: The patient's mood and manner are appropriate. Grooming and personal hygiene are appropriate.  MEDICAL DECISION MAKING: Patient here with likely viral upper respiratory infection.  Could be influenza but patient has had symptoms for over 3 days.  Outside treatment window for Tamiflu.  Chest x-ray obtained in triage shows no infiltrate, edema or pneumothorax.  Lungs now clear and she feels better after breathing treatment.  Will discharge with albuterol inhaler and prednisone burst.  Discussed return precautions and supportive care instructions at home.  Will provide her with a work note.  Discharged with Jerilynn Som.   At this time, I do not feel there is any  life-threatening condition present. I have reviewed and discussed all results (EKG, imaging, lab, urine as appropriate) and exam findings with patient/family. I have reviewed nursing notes and appropriate previous records.  I feel the patient is safe to be discharged home without further emergent workup and can continue workup as an outpatient as needed. Discussed usual and customary return precautions. Patient/family verbalize understanding and are comfortable  with this plan.  Outpatient follow-up has been provided if needed. All questions have been answered.     Elijiah Mickley, Layla MawKristen N, DO 12/19/17 [phone]

## 2017-12-19 NOTE — Discharge Instructions (Signed)
You may alternate Tylenol 1000 mg every 6 hours as needed for fever and pain and ibuprofen 800 mg every 8 hours as needed for fever and pain. Please rest and drink plenty of fluids. This is a viral illness causing your symptoms. You do not need antibiotics for a virus. You may use over-the-counter nasal saline spray and Afrin nasal saline spray as needed for nasal congestion. Please do not use Afrin for more than 3 days in a row. You may use Mucinex and Dextromethorphan as needed for cough.  You may use lozenges and Chloraseptic spray to help with sore throat.  Warm salt water gargles can also help with sore throat.  You may use over-the-counter Unisom (doxyalamine) or Benadryl to help with sleep.  Please note that some combination medicines such as DayQuil and NyQuil have multiple medications in them.  Please make sure you look at all labels to ensure that you are not taking too much of any one particular medication.  Symptoms from a virus may take 7-14 days to run its course.  We do not test for the flu from the emergency department as we do not have rapid flu swabs and it takes hours for this test to come back and it would not change our management. The flu is treated like any other virus with supportive measures as listed above. At this time you are outside the treatment window for Tamiflu. Tamiflu has to be taken within the first 48 hours of symptoms.  Tamiflu has many side effects including nausea, vomiting and diarrhea.   To find a primary care or specialty doctor please call [phone] or [phone] to access "[place] Find a Doctor Service."  You may also go on the [facility] website at [email or web address]  There are also multiple [facility], [facility], [facility] and [facility] practices throughout the [place] that are frequently accepting new patients. You may find a clinic that is close to your home and contact them.  [facility] -  [address] [place] [phone]   [facility] -  [address] [place] [id number] [phone]   [facility] [phone]  [place] [phone]

## 2017-12-26 ENCOUNTER — Encounter (HOSPITAL_COMMUNITY): Payer: Self-pay

## 2017-12-26 ENCOUNTER — Emergency Department (HOSPITAL_COMMUNITY): Payer: Self-pay

## 2017-12-26 ENCOUNTER — Emergency Department (HOSPITAL_COMMUNITY)
Admission: EM | Admit: 2017-12-26 | Discharge: 2017-12-26 | Disposition: A | Discharge status: Home / Self Care | Payer: Self-pay | Attending: Emergency Medicine | Admitting: Emergency Medicine

## 2017-12-26 DIAGNOSIS — J45909 Unspecified asthma, uncomplicated: Secondary | ICD-10-CM | POA: Insufficient documentation

## 2017-12-26 DIAGNOSIS — J449 Chronic obstructive pulmonary disease, unspecified: Secondary | ICD-10-CM | POA: Insufficient documentation

## 2017-12-26 DIAGNOSIS — Z79899 Other long term (current) drug therapy: Secondary | ICD-10-CM | POA: Insufficient documentation

## 2017-12-26 DIAGNOSIS — J019 Acute sinusitis, unspecified: Secondary | ICD-10-CM | POA: Insufficient documentation

## 2017-12-26 MED ORDER — HYDROCOD POLST-CPM POLST ER 10-8 MG/5ML PO SUER: 5.0000 mL | Freq: Every evening | ORAL | 0 refills | Status: DC | PRN

## 2017-12-26 MED ORDER — AMOXICILLIN-POT CLAVULANATE 875-125 MG PO TABS: 1.0000 | ORAL_TABLET | Freq: Two times a day (BID) | ORAL | 0 refills | Status: DC

## 2017-12-26 MED ORDER — FLUTICASONE PROPIONATE 50 MCG/ACT NA SUSP: 1.0000 | Freq: Every day | NASAL | 2 refills | Status: DC

## 2017-12-26 NOTE — Discharge Instructions (Signed)
Please read attached information regarding your condition. Take Flonase as needed for nasal congestion. Take Augmentin to help with your sinus infection.  Complete the entire course of this medication regardless of symptom improvement. Take Tussionex as needed for cough nightly. Return to ED for worsening symptoms, chest pain, trouble breathing, trouble swallowing, coughing up blood.

## 2017-12-26 NOTE — ED Triage Notes (Signed)
Patient here for further evaluation of ongoing cough. [facility] and has taken all meds with no relief. NAD

## 2017-12-26 NOTE — ED Provider Notes (Signed)
[facility] EMERGENCY DEPARTMENT Provider Note   CSN: [id number] Arrival date & time: 12/26/17  1413     History   Chief Complaint No chief complaint on file.   HPI Angelica Miller is a 52 y.o. female with past medical history of COPD, asthma, who presents to ED for evaluation of 2-week history of sinus pressure, nasal congestion, dry cough.  She was seen and evaluated here 1 week ago and states that she had improvement in her symptoms with the medications that were given to her.  However, she ran out of those medications and has been having continuation of her symptoms.  She reports that the cough is dry but it is keeping her up at night.  She has been using her home inhalers and nebulizer treatments with only mild improvement in her symptoms.  She denies any chest pain, abdominal pain, hemoptysis, fever, lightheadedness or loss of consciousness.  HPI  Past Medical History:  Diagnosis Date  . Asthma   . Chronic bronchitis (HCC)   . Migraines   . Reflux esophagitis     There are no active problems to display for this patient.   Past Surgical History:  Procedure Laterality Date  . ENDOMETRIAL ABLATION W/ NOVASURE      OB History    No data available       Home Medications    Prior to Admission medications   Medication Sig Start Date End Date Taking? Authorizing Provider  amoxicillin-clavulanate (AUGMENTIN) 875-125 MG tablet Take 1 tablet by mouth every 12 (twelve) hours. 12/26/17   Lawernce Earll, PA-C  benzonatate (TESSALON) 100 MG capsule Take 1 capsule (100 mg total) by mouth 3 (three) times daily as needed for cough. 12/19/17   Ward, Layla Maw, DO  chlorpheniramine-HYDROcodone (TUSSIONEX PENNKINETIC ER) 10-8 MG/5ML SUER Take 5 mLs by mouth at bedtime as needed for cough. 12/26/17   Narciso Stoutenburg, PA-C  fluticasone (FLONASE) 50 MCG/ACT nasal spray Place 1 spray into both nostrils daily. 12/26/17   Amiliana Foutz, PA-C  predniSONE (DELTASONE) 20 MG tablet Take  3 tablets (60 mg total) by mouth daily. 12/19/17   Ward, Layla Maw, DO    Family History Family History  Problem Relation Age of Onset  . Cancer Other   . Hypertension Other   . Diabetes Other     Social History Social History   Tobacco Use  . Smoking status: Never Smoker  . Smokeless tobacco: Never Used  Substance Use Topics  . Alcohol use: No    Frequency: Never  . Drug use: No     Allergies   Patient has no known allergies.   Review of Systems Review of Systems  Constitutional: Negative for chills and fever.  HENT: Positive for congestion, sinus pressure and sinus pain. Negative for ear pain, hearing loss, postnasal drip, rhinorrhea, sore throat and tinnitus.   Respiratory: Positive for cough and wheezing. Negative for shortness of breath.   Gastrointestinal: Negative for nausea and vomiting.     Physical Exam Updated Vital Signs BP 117/61 (BP Location: Right Arm)   Pulse 76   Temp 98.7 F (37.1 C) (Oral)   Resp 16   Ht 5\' 2"  (1.575 m)   Wt 83.9 kg (185 lb)   SpO2 94%   BMI 33.84 kg/m   Physical Exam  Constitutional: She appears well-developed and well-nourished. No distress.  Nontoxic appearing and in no acute distress.  Speaking complete sentences without difficulty.  HENT:  Head: Normocephalic and  atraumatic.  Right Ear: Tympanic membrane normal.  Left Ear: Tympanic membrane normal.  Nose: Mucosal edema present. Right sinus exhibits maxillary sinus tenderness and frontal sinus tenderness. Left sinus exhibits maxillary sinus tenderness and frontal sinus tenderness.  Mouth/Throat: No posterior oropharyngeal edema or posterior oropharyngeal erythema. No tonsillar exudate.  Eyes: Conjunctivae and EOM are normal. No scleral icterus.  Neck: Normal range of motion.  Cardiovascular: Normal rate, regular rhythm and normal heart sounds.  Pulmonary/Chest: Effort normal and breath sounds normal. No respiratory distress.  Lungs clear to auscultation bilaterally.   No wheezing noted.  Neurological: She is alert.  Skin: No rash noted. She is not diaphoretic.  Psychiatric: She has a normal mood and affect.  Nursing note and vitals reviewed.    ED Treatments / Results  Labs (all labs ordered are listed, but only abnormal results are displayed) Labs Reviewed - No data to display  EKG  EKG Interpretation None       Radiology Dg Chest 2 View  Result Date: 12/26/2017 CLINICAL DATA:  Cough symptoms for 2 weeks EXAM: CHEST  2 VIEW COMPARISON:  12/18/2016. FINDINGS: Normal cardiomediastinal silhouette. No consolidation or edema. Slight subsegmental platelike atelectasis LEFT lung, minimally worse from priors. No effusion or pneumothorax. Bones unremarkable. IMPRESSION: Minor subsegmental atelectasis in LEFT lung, but no consolidation or edema. Minimal worsening from priors. Electronically Signed   By: Elsie Stain M.D.   On: 12/26/2017 15:33    Procedures Procedures (including critical care time)  Medications Ordered in ED Medications - No data to display   Initial Impression / Assessment and Plan / ED Course  I have reviewed the triage vital signs and the nursing notes.  Pertinent labs & imaging results that were available during my care of the patient were reviewed by me and considered in my medical decision making (see chart for details).     Patient presents to ED for evaluation of 2-week history of sinus pain and congestion.  She was seen and evaluated here 1 week ago for what appeared to be a COPD exacerbation and reports improvement in her symptoms with those medications.  However, she states that she continues to have a cough and sinus pressure.  On physical exam today her lungs are clear to auscultation bilaterally.  She has not pink and she is afebrile.  Her chest x-ray today showed no acute abnormalities.  She does have sinus tenderness to palpation.  I suspect that her symptoms are due to sinusitis which will need to be treated with  antibiotics based on the duration of her symptoms.  We will also give Flonase as needed for nasal congestion.  Will give cough syrup to be taken as needed as well.  [place] narcotic database reviewed with no discrepancies.  Patient appears stable for discharge at this time.  Strict return precautions given.  Portions of this note were generated with Scientist, clinical (histocompatibility and immunogenetics). Dictation errors may occur despite best attempts at proofreading.   Final Clinical Impressions(s) / ED Diagnoses   Final diagnoses:  Acute non-recurrent sinusitis, unspecified location    ED Discharge Orders        Ordered    amoxicillin-clavulanate (AUGMENTIN) 875-125 MG tablet  Every 12 hours     12/26/17 1721    chlorpheniramine-HYDROcodone (TUSSIONEX PENNKINETIC ER) 10-8 MG/5ML SUER  At bedtime PRN     12/26/17 1721    fluticasone (FLONASE) 50 MCG/ACT nasal spray  Daily     12/26/17 1721  Dietrich PatesKhatri, Tameyah Koch, PA-C 12/26/17 1725    Cathren LaineSteinl, Kevin, MD 12/26/17 2132

## 2018-01-15 ENCOUNTER — Ambulatory Visit: Payer: Self-pay | Admitting: Internal Medicine

## 2018-01-15 ENCOUNTER — Encounter: Payer: Self-pay | Admitting: Internal Medicine

## 2018-01-15 VITALS — BP 140/80 | HR 76 | Resp 12 | Ht 61.5 in | Wt 186.0 lb

## 2018-01-15 DIAGNOSIS — N644 Mastodynia: Secondary | ICD-10-CM

## 2018-01-15 DIAGNOSIS — H547 Unspecified visual loss: Secondary | ICD-10-CM

## 2018-01-15 NOTE — Progress Notes (Signed)
   Subjective:    Patient ID: Angelica Miller, female    DOB: [birth year], 52 y.o.   MRN: [id number]  HPI   Right breast pain for past 4 days.  Feels like a shooting pain coming out through the nipple.  No redness, swelling or nipple discharge.  No fever. Pain is there for 3-4 minutes about 3 times daily. Hurts if lies on the breast.   Did get seen about 1 month ago at ED for bronchitis. CXR was negative for pneumonia per patient, but unable to find in her chart.  Apparently, has another chart, however, that needs to be merged.  No outpatient medications have been marked as taking for the 01/15/18 encounter (Office Visit) with Julieanne MansonMulberry, Daesha Insco, MD.    Allergies  Allergen Reactions  . Penicillins Rash    .    Review of Systems     Objective:   Physical Exam   NAD Bilateral  breasts:  No focal mass, skin dimpling, nipple discharge or axillary adenopathy.  Mild discomfort with initial palpation of right nipple. Lungs:  CTA CV:  RRR without murmur or rub, radial pulses normal and equal        Assessment & Plan:  1.  Right Breast Pain:  No findings:  Will send for right mammogram  2.  Decreased Visual Acuity:  Optometry referral.  REquested referral at end of visit.

## 2018-03-15 ENCOUNTER — Other Ambulatory Visit (HOSPITAL_COMMUNITY): Payer: Self-pay | Admitting: *Deleted

## 2018-03-15 DIAGNOSIS — N644 Mastodynia: Secondary | ICD-10-CM

## 2018-04-04 ENCOUNTER — Ambulatory Visit: Payer: Self-pay | Admitting: Internal Medicine

## 2018-04-04 ENCOUNTER — Encounter: Payer: Self-pay | Admitting: Internal Medicine

## 2018-04-04 ENCOUNTER — Ambulatory Visit (HOSPITAL_COMMUNITY)
Admission: RE | Admit: 2018-04-04 | Discharge: 2018-04-04 | Disposition: A | Discharge status: Home / Self Care | Payer: No Typology Code available for payment source | Source: Ambulatory Visit | Attending: Internal Medicine | Admitting: Internal Medicine

## 2018-04-04 VITALS — BP 122/80 | HR 86 | Resp 12 | Ht 61.5 in | Wt 191.0 lb

## 2018-04-04 DIAGNOSIS — M25561 Pain in right knee: Secondary | ICD-10-CM | POA: Insufficient documentation

## 2018-04-04 DIAGNOSIS — M25461 Effusion, right knee: Secondary | ICD-10-CM | POA: Insufficient documentation

## 2018-04-04 DIAGNOSIS — M1711 Unilateral primary osteoarthritis, right knee: Secondary | ICD-10-CM | POA: Insufficient documentation

## 2018-04-04 MED ORDER — DICLOFENAC SODIUM 75 MG PO TBEC: 75.0000 mg | DELAYED_RELEASE_TABLET | Freq: Two times a day (BID) | ORAL | 1 refills | Status: DC

## 2018-04-04 NOTE — Progress Notes (Signed)
   Subjective:    Patient ID: Angelica Miller, female    DOB: 03-28-1966, 52 y.o.   MRN: [id number]  HPI   Right knee swelled up yesterday at work.  Had problems with this similarly 10 years ago.  Had to have fluid drained off after twisting knee in 2007.   History of what sounds like medial meniscus repair twice in the 1990s on the right knee--arthroscopic approach.  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  She was without a cushioned mat on the hard floor yesterday.  Was wearing Nike tennis shoes yesterday.  Her knee started to throb before end of shift.   She does do some deep knee bending, but no kneeling to pick up boxes from low shelf and move them elsewhere. No sense of joint locking. Left work this morning as she was having so much pain bearing weight.   Has taken Ibuprofen 800 mg every 4 hours.  Took with food.  Did not try any other OTC or prescription medication.     No outpatient medications have been marked as taking for the 04/04/18 encounter (Office Visit) with Julieanne Manson, MD.    Allergies  Allergen Reactions  . Penicillins Rash     Review of Systems     Objective:   Physical Exam NAD Obese Some mild limping, favoring right leg Right knee:  Good ROM.  Minimal swelling at joint line just adjacent to patellar tendon both laterally and medially. Loses joint line tenderness as palpate to more medial joint line and over medial collateral ligament.  Maintains mild joint line tenderness with palpation laterally and over lateral collateral ligament.   Perhaps some mild popliteal space fullness with some tenderness there, but no fullness into upper calf/gastroc/soleus area. No cruciate or collateral ligament laxity, though clear popping noise with initial anterior drawer maneuver.  Did not recur with repeat anterior drawer maneuver. No tenderness with posterior compression of patella No redness or  increase heat of joint. Normal DP and PT pulses of right foot/ankle      Assessment & Plan:  Right knee pain with minimal if any effusion:  Check xray to view for more prominent effusion perhaps than can be noted on physical exam due to patient size and to get an idea if has Baker's Cyst Diclofenac 75 mg twice daily. Rest for 2-3 days. Needs cushioned mat and to avoid deep knee bending and quick turning or twisting motion of knee.

## 2018-04-05 ENCOUNTER — Telehealth: Payer: Self-pay

## 2018-04-05 MED ORDER — DICLOFENAC SODIUM 75 MG PO TBEC: 75.0000 mg | DELAYED_RELEASE_TABLET | Freq: Two times a day (BID) | ORAL | 1 refills | Status: DC

## 2018-04-05 NOTE — Telephone Encounter (Signed)
Patient called stating health department no longer carries Diclofenac. Patient would like to know what else she can take in place of dilofenac because she is in a lot of pain.  To Dr. Delrae Alfred for further diections

## 2018-04-05 NOTE — Telephone Encounter (Signed)
I have printed a coupon for [employer], which when I get back to clinic, she can pick up.  It will cost a bit more than $16 for #60--is that okay with her?

## 2018-04-05 NOTE — Addendum Note (Signed)
Addended by: Marcene Duos on: 04/05/2018 01:23 PM   Modules accepted: Orders

## 2018-04-05 NOTE — Telephone Encounter (Signed)
Patient informed and will pick up coupon for [facility]

## 2018-04-11 ENCOUNTER — Encounter (HOSPITAL_COMMUNITY): Payer: Self-pay

## 2018-04-11 ENCOUNTER — Ambulatory Visit
Admission: RE | Admit: 2018-04-11 | Discharge: 2018-04-11 | Disposition: A | Discharge status: Home / Self Care | Payer: No Typology Code available for payment source | Source: Ambulatory Visit | Attending: Obstetrics and Gynecology | Admitting: Obstetrics and Gynecology

## 2018-04-11 ENCOUNTER — Ambulatory Visit (HOSPITAL_COMMUNITY)
Admission: RE | Admit: 2018-04-11 | Discharge: 2018-04-11 | Disposition: A | Discharge status: Home / Self Care | Payer: Self-pay | Source: Ambulatory Visit | Attending: Obstetrics and Gynecology | Admitting: Obstetrics and Gynecology

## 2018-04-11 VITALS — BP 146/94 | Ht 62.0 in

## 2018-04-11 DIAGNOSIS — N644 Mastodynia: Secondary | ICD-10-CM

## 2018-04-11 DIAGNOSIS — Z1239 Encounter for other screening for malignant neoplasm of breast: Secondary | ICD-10-CM

## 2018-04-11 NOTE — Progress Notes (Signed)
Complaints of right breast nipple pain x 2 months that comes and goes. Patient rates the pain at a 10 out of 10.  Pap Smear: Pap smear not completed today. Last Pap smear was 02/27/2017 at [facility] and normal. Per patient has no history of an abnormal Pap smear. Last Pap smear result is in Epic.  Physical exam: Breasts Breasts symmetrical. No skin abnormalities bilateral breasts. No nipple retraction bilateral breasts. No nipple discharge bilateral breasts. No lymphadenopathy. No lumps palpated bilateral breasts. Complaints of right breast tenderness at nipple area on exam. Referred patient to the [facility] of [facility] for a diagnostic mammogram and possible right breast ultrasound. Appointment scheduled for Thursday, Apr 11, 2018 at 0930.        Pelvic/Bimanual No Pap smear completed today since last Pap smear was 02/27/2017. Pap smear not indicated per BCCCP guidelines.   Smoking History: Patient is a former smoker that quit 02/13/2017.  Patient Navigation: Patient education provided. Access to services provided for patient through BCCCP program.   Colorectal Cancer Screening: Per patient has never had a colonoscopy completed. No complaints today. FIT Test given to patient to complete and return to BCCCP.  Breast and Cervical Cancer Risk Assessment: Patient has a family history of a maternal aunt having breast cancer. Patient has no known genetic mutations or history of radiation treatment to the chest before age 42. Patient has no history of cervical dysplasia, immunocompromised, or DES exposure in-utero. Patient has a 5-year risk for breast cancer at 1.2% and a lifetime risk at 8.5%.

## 2018-04-11 NOTE — Patient Instructions (Signed)
Explained breast self awareness with Angelica Miller. Patient did not need a Pap smear today due to last Pap smear was 02/27/2017. Let her know BCCCP will cover Pap smears every 3 years unless has a history of abnormal Pap smears. Referred patient to the [facility] for a diagnostic mammogram and possible right breast ultrasound. Appointment scheduled for Thursday, Apr 11, 2018 at 0930. Angelica Miller verbalized understanding.  Angelica Miller, Angelica Maser, RN 10:18 AM

## 2018-04-13 ENCOUNTER — Other Ambulatory Visit: Payer: Self-pay

## 2018-04-19 ENCOUNTER — Ambulatory Visit: Payer: Self-pay | Admitting: Internal Medicine

## 2018-04-20 LAB — FECAL OCCULT BLOOD, IMMUNOCHEMICAL: Fecal Occult Bld: NEGATIVE

## 2018-04-22 ENCOUNTER — Encounter (HOSPITAL_COMMUNITY): Payer: Self-pay

## 2018-04-24 ENCOUNTER — Ambulatory Visit: Payer: Self-pay | Admitting: Internal Medicine

## 2018-04-30 ENCOUNTER — Encounter (HOSPITAL_COMMUNITY): Payer: Self-pay | Admitting: *Deleted

## 2018-05-02 ENCOUNTER — Emergency Department (HOSPITAL_COMMUNITY): Payer: Self-pay

## 2018-05-02 ENCOUNTER — Emergency Department (HOSPITAL_COMMUNITY)
Admission: EM | Admit: 2018-05-02 | Discharge: 2018-05-02 | Disposition: A | Discharge status: Home / Self Care | Payer: Self-pay | Attending: Emergency Medicine | Admitting: Emergency Medicine

## 2018-05-02 DIAGNOSIS — J42 Unspecified chronic bronchitis: Secondary | ICD-10-CM | POA: Insufficient documentation

## 2018-05-02 DIAGNOSIS — Z87891 Personal history of nicotine dependence: Secondary | ICD-10-CM | POA: Insufficient documentation

## 2018-05-02 DIAGNOSIS — J45909 Unspecified asthma, uncomplicated: Secondary | ICD-10-CM | POA: Insufficient documentation

## 2018-05-02 DIAGNOSIS — J069 Acute upper respiratory infection, unspecified: Secondary | ICD-10-CM | POA: Insufficient documentation

## 2018-05-02 MED ORDER — PREDNISONE 20 MG PO TABS: 40.0000 mg | ORAL_TABLET | Freq: Every day | ORAL | 0 refills | Status: AC

## 2018-05-02 MED ORDER — ALBUTEROL SULFATE HFA 108 (90 BASE) MCG/ACT IN AERS
2.0000 | INHALATION_SPRAY | Freq: Once | RESPIRATORY_TRACT | Status: AC
  Administered 2018-05-02: 2 via RESPIRATORY_TRACT
  Filled 2018-05-02: qty 6.7

## 2018-05-02 MED ORDER — IPRATROPIUM-ALBUTEROL 0.5-2.5 (3) MG/3ML IN SOLN
3.0000 mL | Freq: Once | RESPIRATORY_TRACT | Status: AC
  Administered 2018-05-02: 3 mL via RESPIRATORY_TRACT
  Filled 2018-05-02: qty 3

## 2018-05-02 NOTE — ED Triage Notes (Signed)
Patient complains of cough and congestion x 2 days, out of inhaler, states that she thinks its her bronchitis. Also has right knee pain and swelling x 3 weeks. Denies injury, alert and oriented, NAD
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
## 2018-05-02 NOTE — ED Notes (Signed)
Pt verbalized understanding discharge instructions and denies any further needs or questions at this time. VS stable, ambulatory and steady gait.   

## 2018-05-02 NOTE — Discharge Instructions (Addendum)
Please take the albuterol inhaler every 4-6 hours as needed for cough and wheezing.  Please take the prednisone daily with breakfast for the next 5 days.  Over the next several days you should rest as much as possible, and drink more fluids than usual. Liquids will help thin and loosen mucus so you can cough it up. Liquids will also help prevent dehydration. Using a cool mist humidifier or a vaporizer to increase air moisture in your home can also make it easier for you to breathe and help decrease your cough.  To help soothe a sore throat gargle with warm salt water.  Make salt water by dissolving  teaspoon salt in 1 cup warm water. You may also use throat lozenges and over the counter sore throat spray.  Please follow up with your primary care provider within 5-7 days for re-evaluation of your symptoms. If you do not have a primary care provider, information for a healthcare clinic has been provided for you to make arrangements for follow up care. Please return to the emergency department for any persistent fevers, worsening sore throat/hoarse voice, inability to swallow, persistent vomiting, chest pain, shortness of breath, coughing up blood, or any new or worsening symptoms.

## 2018-06-04 ENCOUNTER — Ambulatory Visit: Payer: Self-pay | Admitting: Internal Medicine

## 2018-08-02 ENCOUNTER — Emergency Department: Admit: 2018-08-02 | Payer: Self-pay | Primary: Family Medicine

## 2018-08-02 ENCOUNTER — Inpatient Hospital Stay
Admit: 2018-08-02 | Discharge: 2018-08-02 | Disposition: A | Discharge status: Home / Self Care | Payer: Self-pay | Attending: Emergency Medicine

## 2018-08-02 DIAGNOSIS — M170 Bilateral primary osteoarthritis of knee: Secondary | ICD-10-CM

## 2018-08-02 MED ORDER — IBUPROFEN 600 MG TAB
600 mg | ORAL_TABLET | Freq: Four times a day (QID) | ORAL | 0 refills | Status: DC | PRN
Start: 2018-08-02 — End: 2018-08-22

## 2018-08-02 NOTE — ED Triage Notes (Signed)
Patient reports to ED with complaints of bilateral knee pain

## 2018-08-02 NOTE — ED Provider Notes (Signed)
HPI: Patient is a 52 year old female who presents to the ER with complaint of having a one-week history of bilateral knee pain.  States the pain is worse with walking.  Denies any type of trauma to her knees, fever, chills or past similar symptom.    Past Medical History:   Diagnosis Date   ??? Chest pain, unspecified    ??? COPD (chronic obstructive pulmonary disease) (HCC)    ??? GERD (gastroesophageal reflux disease)    ??? Hyperlipidemia    ??? Migraine    ??? Obesity, unspecified    ??? Other second degree atrioventricular block    ??? Shortness of breath        Past Surgical History:   Procedure Laterality Date   ??? HX GYN     ??? HX TUBAL LIGATION           Family History:   Problem Relation Age of Onset   ??? Cancer Other    ??? Diabetes Other    ??? Heart Disease Other    ??? Hypertension Other    ??? Stroke Other        Social History     Socioeconomic History   ??? Marital status: SINGLE     Spouse name: Not on file   ??? Number of children: Not on file   ??? Years of education: Not on file   ??? Highest education level: Not on file   Occupational History   ??? Not on file   Social Needs   ??? Financial resource strain: Not on file   ??? Food insecurity:     Worry: Not on file     Inability: Not on file   ??? Transportation needs:     Medical: Not on file     Non-medical: Not on file   Tobacco Use   ??? Smoking status: Current Some Day Smoker   ??? Smokeless tobacco: Never Used   Substance and Sexual Activity   ??? Alcohol use: Yes     Comment: rarely   ??? Drug use: No   ??? Sexual activity: Not on file   Lifestyle   ??? Physical activity:     Days per week: Not on file     Minutes per session: Not on file   ??? Stress: Not on file   Relationships   ??? Social connections:     Talks on phone: Not on file     Gets together: Not on file     Attends religious service: Not on file     Active member of club or organization: Not on file     Attends meetings of clubs or organizations: Not on file     Relationship status: Not on file   ??? Intimate partner violence:      Fear of current or ex partner: Not on file     Emotionally abused: Not on file     Physically abused: Not on file     Forced sexual activity: Not on file   Other Topics Concern   ??? Not on file   Social History Narrative   ??? Not on file         ALLERGIES: Patient has no known allergies.    Review of Systems   Constitutional: Negative.    HENT: Negative.    Eyes: Negative.    Respiratory: Negative.    Cardiovascular: Negative.    Gastrointestinal: Negative.    Endocrine: Negative.    Genitourinary: Negative.  Musculoskeletal: Positive for arthralgias. Negative for joint swelling and myalgias.        BILATERAL KNEE PAIN   Skin: Negative.    Allergic/Immunologic: Negative.    Neurological: Negative.    Hematological: Negative.    Psychiatric/Behavioral: Negative.    All other systems reviewed and are negative.      Vitals:    08/02/18 0553   BP: 133/81   Pulse: 98   Resp: 18   Temp: 98.9 ??F (37.2 ??C)   SpO2: 96%   Weight: 81.6 kg (180 lb)            Physical Exam   Constitutional: She is oriented to person, place, and time. She appears well-developed and well-nourished. No distress.   HENT:   Head: Normocephalic.   Right Ear: External ear normal.   Left Ear: External ear normal.   Mouth/Throat: No oropharyngeal exudate.   Eyes: Pupils are equal, round, and reactive to light. Conjunctivae and EOM are normal. Right eye exhibits no discharge. Left eye exhibits no discharge. No scleral icterus.   Neck: Normal range of motion. Neck supple. No JVD present. No tracheal deviation present. No thyromegaly present.   Cardiovascular: Normal rate, regular rhythm, normal heart sounds and intact distal pulses. Exam reveals no gallop and no friction rub.   No murmur heard.  Pulmonary/Chest: Effort normal and breath sounds normal. No stridor. No respiratory distress. She has no wheezes. She has no rales. She exhibits no tenderness.   Abdominal: Soft. Bowel sounds are normal. She exhibits no distension and  no mass. There is no tenderness. There is no rebound and no guarding.   Musculoskeletal: Normal range of motion. She exhibits no edema or tenderness.   LEFT AND RIGHT KNEE: no edema, normal ROM, pulses and sensory.  No erythema.  (+) mild pain with ROM.   Lymphadenopathy:     She has no cervical adenopathy.   Neurological: She is alert and oriented to person, place, and time. She displays normal reflexes. No cranial nerve deficit. She exhibits normal muscle tone. Coordination normal.   Skin: Skin is warm and dry. No rash noted. She is not diaphoretic. No erythema. No pallor.   Nursing note and vitals reviewed.       MDM: Differential diagnosis: Arthritis, bursitis, muscle skeletal pain     Dx: arthritis    Disp: D/C  Home, F/U PCP in 1 week.  Return to ER prn.    Dictation disclaimer:  Please note that this dictation was completed with Dragon, the computer voice recognition software.  Quite often unanticipated grammatical, syntax, homophones, and other interpretive errors are inadvertently transcribed by the computer software.  Please disregard these errors.  Please excuse any errors that have escaped final proofreading.

## 2018-08-02 NOTE — ED Notes (Signed)
I have reviewed discharge instructions with the patient.  The patient verbalized understanding.    Medication teaching given, to include name, dose, action, and side effects. Patient verbalized understanding of medications. Encouraged patient to voice any concerns with reassurance provided.      Patient armband removed and shredded    Patient Discharged in stable condition.

## 2018-08-02 NOTE — ED Provider Notes (Signed)
HPI: Patient is a 52 year old female who presents to the ER with complaint of having a one-week history of bilateral knee pain.  States the pain is worse with walking.  Denies any type of trauma to her knees, fever, chills or past similar symptom.    Past Medical History:   Diagnosis Date   ??? Chest pain, unspecified    ??? COPD (chronic obstructive pulmonary disease) (HCC)    ??? GERD (gastroesophageal reflux disease)    ??? Hyperlipidemia    ??? Migraine    ??? Obesity, unspecified    ??? Other second degree atrioventricular block    ??? Shortness of breath        Past Surgical History:   Procedure Laterality Date   ??? HX GYN     ??? HX TUBAL LIGATION           Family History:   Problem Relation Age of Onset   ??? Cancer Other    ??? Diabetes Other    ??? Heart Disease Other    ??? Hypertension Other    ??? Stroke Other        Social History     Socioeconomic History   ??? Marital status: SINGLE     Spouse name: Not on file   ??? Number of children: Not on file   ??? Years of education: Not on file   ??? Highest education level: Not on file   Occupational History   ??? Not on file   Social Needs   ??? Financial resource strain: Not on file   ??? Food insecurity:     Worry: Not on file     Inability: Not on file   ??? Transportation needs:     Medical: Not on file     Non-medical: Not on file   Tobacco Use   ??? Smoking status: Current Some Day Smoker   ??? Smokeless tobacco: Never Used   Substance and Sexual Activity   ??? Alcohol use: Yes     Comment: rarely   ??? Drug use: No   ??? Sexual activity: Not on file   Lifestyle   ??? Physical activity:     Days per week: Not on file     Minutes per session: Not on file   ??? Stress: Not on file   Relationships   ??? Social connections:     Talks on phone: Not on file     Gets together: Not on file     Attends religious service: Not on file     Active member of club or organization: Not on file     Attends meetings of clubs or organizations: Not on file     Relationship status: Not on file   ??? Intimate partner violence:      Fear of current or ex partner: Not on file     Emotionally abused: Not on file     Physically abused: Not on file     Forced sexual activity: Not on file   Other Topics Concern   ??? Not on file   Social History Narrative   ??? Not on file         ALLERGIES: Patient has no known allergies.    Review of Systems   Constitutional: Negative.    HENT: Negative.    Eyes: Negative.    Respiratory: Negative.    Cardiovascular: Negative.    Gastrointestinal: Negative.    Endocrine: Negative.    Genitourinary: Negative.  Musculoskeletal: Positive for arthralgias. Negative for joint swelling and myalgias.        BILATERAL KNEE PAIN   Skin: Negative.    Allergic/Immunologic: Negative.    Neurological: Negative.    Hematological: Negative.    Psychiatric/Behavioral: Negative.    All other systems reviewed and are negative.      Vitals:    08/02/18 0553   BP: 133/81   Pulse: 98   Resp: 18   Temp: 98.9 ??F (37.2 ??C)   SpO2: 96%   Weight: 81.6 kg (180 lb)            Physical Exam   Constitutional: She is oriented to person, place, and time. She appears well-developed and well-nourished. No distress.   HENT:   Head: Normocephalic.   Right Ear: External ear normal.   Left Ear: External ear normal.   Mouth/Throat: No oropharyngeal exudate.   Eyes: Pupils are equal, round, and reactive to light. Conjunctivae and EOM are normal. Right eye exhibits no discharge. Left eye exhibits no discharge. No scleral icterus.   Neck: Normal range of motion. Neck supple. No JVD present. No tracheal deviation present. No thyromegaly present.   Cardiovascular: Normal rate, regular rhythm, normal heart sounds and intact distal pulses. Exam reveals no gallop and no friction rub.   No murmur heard.  Pulmonary/Chest: Effort normal and breath sounds normal. No stridor. No respiratory distress. She has no wheezes. She has no rales. She exhibits no tenderness.   Abdominal: Soft. Bowel sounds are normal. She exhibits no distension and no mass. There is no  tenderness. There is no rebound and no guarding.   Musculoskeletal: Normal range of motion. She exhibits no edema or tenderness.   LEFT AND RIGHT KNEE: no edema, normal ROM, pulses and sensory.  No erythema.  (+) mild pain with ROM.   Lymphadenopathy:     She has no cervical adenopathy.   Neurological: She is alert and oriented to person, place, and time. She displays normal reflexes. No cranial nerve deficit. She exhibits normal muscle tone. Coordination normal.   Skin: Skin is warm and dry. No rash noted. She is not diaphoretic. No erythema. No pallor.   Nursing note and vitals reviewed.       MDM: Differential diagnosis: Arthritis, bursitis, muscle skeletal pain     Dx: arthritis    Disp: D/C  Home, F/U PCP in 1 week.  Return to ER prn.    Dictation disclaimer:  Please note that this dictation was completed with Dragon, the computer voice recognition software.  Quite often unanticipated grammatical, syntax, homophones, and other interpretive errors are inadvertently transcribed by the computer software.  Please disregard these errors.  Please excuse any errors that have escaped final proofreading.

## 2018-08-02 NOTE — ED Notes (Signed)
Patient reports to ED with complaints of bilateral knee pain

## 2018-08-22 ENCOUNTER — Inpatient Hospital Stay: Admit: 2018-08-22 | Primary: Family Medicine

## 2018-08-22 ENCOUNTER — Ambulatory Visit: Admit: 2018-08-22 | Payer: PRIVATE HEALTH INSURANCE | Attending: Nurse Practitioner | Primary: Family Medicine

## 2018-08-22 ENCOUNTER — Ambulatory Visit: Attending: Nurse Practitioner | Primary: Family Medicine

## 2018-08-22 DIAGNOSIS — Z7689 Persons encountering health services in other specified circumstances: Secondary | ICD-10-CM

## 2018-08-22 LAB — COMPREHENSIVE METABOLIC PANEL
ALT: 20 U/L (ref 13–56)
AST: 17 U/L (ref 10–38)
Albumin/Globulin Ratio: 1 (ref 0.8–1.7)
Albumin: 3.7 g/dL (ref 3.4–5.0)
Alkaline Phosphatase: 81 U/L (ref 45–117)
Anion Gap: 4 mmol/L (ref 3.0–18)
BUN: 16 MG/DL (ref 7.0–18)
Bun/Cre Ratio: 24 — ABNORMAL HIGH (ref 12–20)
CO2: 27 mmol/L (ref 21–32)
Calcium: 8.9 MG/DL (ref 8.5–10.1)
Chloride: 110 mmol/L (ref 100–111)
Creatinine: 0.66 MG/DL (ref 0.6–1.3)
EGFR IF NonAfrican American: >60 mL/min/{1.73_m2} (ref >60)
GFR African American: >60 mL/min/{1.73_m2} (ref >60)
Globulin: 3.8 g/dL (ref 2.0–4.0)
Glucose: 88 mg/dL (ref 74–99)
Potassium: 3.7 mmol/L (ref 3.5–5.5)
Sodium: 141 mmol/L (ref 136–145)
Total Bilirubin: 0.2 MG/DL (ref 0.2–1.0)
Total Protein: 7.5 g/dL (ref 6.4–8.2)

## 2018-08-22 LAB — LIPID PANEL
CHOL/HDL Ratio: 4.9 (ref 0–5.0)
Chol/HDL Ratio: 4.9 (ref 0–5.0)
Cholesterol, Total: 267 MG/DL — ABNORMAL HIGH (ref <200)
Cholesterol, total: 267 MG/DL — ABNORMAL HIGH (ref <200)
HDL Cholesterol: 54 MG/DL (ref 40–60)
HDL: 54 MG/DL (ref 40–60)
LDL Calculated: 178.4 MG/DL — ABNORMAL HIGH (ref 0–100)
LDL, calculated: 178.4 MG/DL — ABNORMAL HIGH (ref 0–100)
Triglyceride: 173 MG/DL — ABNORMAL HIGH (ref <150)
Triglycerides: 173 MG/DL — ABNORMAL HIGH (ref <150)
VLDL Cholesterol Calculated: 34.6 MG/DL
VLDL, calculated: 34.6 MG/DL

## 2018-08-22 LAB — CBC WITH AUTO DIFFERENTIAL
Basophils %: 0 % (ref 0–2)
Basophils Absolute: 0 10*3/uL (ref 0.0–0.1)
Eosinophils %: 0 % (ref 0–5)
Eosinophils Absolute: 0 10*3/uL (ref 0.0–0.4)
Hematocrit: 41.5 % (ref 35.0–45.0)
Hemoglobin: 13.8 g/dL (ref 12.0–16.0)
Lymphocytes %: 51 % (ref 21–52)
Lymphocytes Absolute: 3.2 10*3/uL (ref 0.9–3.6)
MCH: 30.6 PG (ref 24.0–34.0)
MCHC: 33.3 g/dL (ref 31.0–37.0)
MCV: 92 FL (ref 74.0–97.0)
MPV: 11.8 FL (ref 9.2–11.8)
Monocytes %: 5 % (ref 3–10)
Monocytes Absolute: 0.3 10*3/uL (ref 0.05–1.2)
Neutrophils %: 44 % (ref 40–73)
Neutrophils Absolute: 2.8 10*3/uL (ref 1.8–8.0)
Platelets: 333 10*3/uL (ref 135–420)
RBC: 4.51 M/uL (ref 4.20–5.30)
RDW: 14.2 % (ref 11.6–14.5)
WBC: 6.4 10*3/uL (ref 4.6–13.2)

## 2018-08-22 LAB — DRUG SCREEN, URINE
AMPHETAMINES: NEGATIVE
Amphetamine Screen, Urine: NEGATIVE
BARBITURATES: NEGATIVE
BENZODIAZEPINES: NEGATIVE
Barbiturate Screen, Urine: NEGATIVE
Benzodiazepine Screen, Urine: NEGATIVE
COCAINE: NEGATIVE
Cocaine Screen Urine: NEGATIVE
METHADONE: NEGATIVE
Methadone Screen, Urine: NEGATIVE
OPIATES: NEGATIVE
Opiate Screen, Urine: NEGATIVE
PCP Screen, Urine: NEGATIVE
PCP(PHENCYCLIDINE): NEGATIVE
THC (TH-CANNABINOL): POSITIVE — AB
THC Screen, Urine: POSITIVE — AB

## 2018-08-22 LAB — VITAMIN D 25 HYDROXY: Vit D, 25-Hydroxy: 5.4 ng/mL — ABNORMAL LOW (ref 30–100)

## 2018-08-22 LAB — MAGNESIUM
Magnesium: 2.3 mg/dL (ref 1.6–2.6)
Magnesium: 2.3 mg/dL (ref 1.6–2.6)

## 2018-08-22 LAB — TSH 3RD GENERATION
TSH: 1.05 u[IU]/mL (ref 0.36–3.74)
TSH: 1.05 u[IU]/mL (ref 0.36–3.74)

## 2018-08-22 LAB — HEMOGLOBIN A1C W/EAG
Hemoglobin A1C: 5.6 % (ref 4.2–5.6)
eAG: 114 mg/dL

## 2018-08-22 LAB — T3, FREE
T3, Free: 3.3 PG/ML (ref 2.18–3.98)
Triiodothyronine (T3), free: 3.3 PG/ML (ref 2.18–3.98)

## 2018-08-22 LAB — T4, FREE
T4 Free: 1 NG/DL (ref 0.7–1.5)
T4, Free: 1 NG/DL (ref 0.7–1.5)

## 2018-08-22 LAB — METABOLIC PANEL, COMPREHENSIVE
A-G Ratio: 1 (ref 0.8–1.7)
ALT (SGPT): 20 U/L (ref 13–56)
AST (SGOT): 17 U/L (ref 10–38)
Albumin: 3.7 g/dL (ref 3.4–5.0)
Alk. phosphatase: 81 U/L (ref 45–117)
Anion gap: 4 mmol/L (ref 3.0–18)
BUN/Creatinine ratio: 24 — ABNORMAL HIGH (ref 12–20)
BUN: 16 MG/DL (ref 7.0–18)
Bilirubin, total: 0.2 MG/DL (ref 0.2–1.0)
CO2: 27 mmol/L (ref 21–32)
Calcium: 8.9 MG/DL (ref 8.5–10.1)
Chloride: 110 mmol/L (ref 100–111)
Creatinine: 0.66 MG/DL (ref 0.6–1.3)
GFR est AA: >60 mL/min/{1.73_m2} (ref >60)
GFR est non-AA: >60 mL/min/{1.73_m2} (ref >60)
Globulin: 3.8 g/dL (ref 2.0–4.0)
Glucose: 88 mg/dL (ref 74–99)
Potassium: 3.7 mmol/L (ref 3.5–5.5)
Protein, total: 7.5 g/dL (ref 6.4–8.2)
Sodium: 141 mmol/L (ref 136–145)

## 2018-08-22 LAB — CBC WITH AUTOMATED DIFF
ABS. BASOPHILS: 0 10*3/uL (ref 0.0–0.1)
ABS. EOSINOPHILS: 0 10*3/uL (ref 0.0–0.4)
ABS. LYMPHOCYTES: 3.2 10*3/uL (ref 0.9–3.6)
ABS. MONOCYTES: 0.3 10*3/uL (ref 0.05–1.2)
ABS. NEUTROPHILS: 2.8 10*3/uL (ref 1.8–8.0)
BASOPHILS: 0 % (ref 0–2)
EOSINOPHILS: 0 % (ref 0–5)
HCT: 41.5 % (ref 35.0–45.0)
HGB: 13.8 g/dL (ref 12.0–16.0)
LYMPHOCYTES: 51 % (ref 21–52)
MCH: 30.6 PG (ref 24.0–34.0)
MCHC: 33.3 g/dL (ref 31.0–37.0)
MCV: 92 FL (ref 74.0–97.0)
MONOCYTES: 5 % (ref 3–10)
MPV: 11.8 FL (ref 9.2–11.8)
NEUTROPHILS: 44 % (ref 40–73)
PLATELET: 333 10*3/uL (ref 135–420)
RBC: 4.51 M/uL (ref 4.20–5.30)
RDW: 14.2 % (ref 11.6–14.5)
WBC: 6.4 10*3/uL (ref 4.6–13.2)

## 2018-08-22 LAB — HEMOGLOBIN A1C WITH EAG
Est. average glucose: 114 mg/dL
Hemoglobin A1c: 5.6 % (ref 4.2–5.6)

## 2018-08-22 LAB — VITAMIN D, 25 HYDROXY: Vitamin D 25-Hydroxy: 5.4 ng/mL — ABNORMAL LOW (ref 30–100)

## 2018-08-22 MED ORDER — DICLOFENAC-MISOPROSTOL 50 MG-0.2 MG TAB, DELAYED RELEASE
50-200 mg-mcg | ORAL_TABLET | Freq: Two times a day (BID) | ORAL | 0 refills | Status: DC | PRN
Start: 2018-08-22 — End: 2018-09-11

## 2018-08-22 NOTE — Progress Notes (Signed)
Given results at OV today. See NN

## 2018-08-22 NOTE — Progress Notes (Signed)
Kimberly Lopez, new patient appt with you on 08/29/18:  1) Vit D Def. E-scribed Vit D 50,000 units to [facility]. Pt to take one per week for 12 weeks. When completed pt needs to start taking 5000 units of over-the-counter Vitamin D3 daily for the rest of life. Sent to [facility] as the price is cheaper.  2) TG 173. Goal <150; LDL 178- e-scribed atorvastatin 20mg every day to [facility] under the [facility] program.   3) +Marijuana  Pt will need labs 1-2 weeks prior to f/u appt 12/03/17. Thank you

## 2018-08-22 NOTE — Patient Instructions (Signed)
The [facility] reminders!   !!PLEASE READ!!    [facility] Operating Hours:  These may change without notice.  Mon- Wed 7am to 5pm. Closed for lunch 12-1pm  Thurs 7am to 12pm  Fridays closed     Office number: [phone]    **In case of inclement weather (snow and/or ice) please do not try to come into the office for your appointment. Please call in and you will not be counted as a "No Show." SAFETY FIRST!!**    Lab work:    Unless you are instructed differently, please return to the office between the hours of 7 am and 11:00 am Monday through Thursday to have your labs drawn one to two weeks before your next scheduled PROVIDER appointment.  If you do not have an appointment to follow up on these results, please make one or plan to call the office if you do not hear from us to get the results.  No news does not mean good news.        Medications:   Medication pick up times are firm:  Mondays and Tuesdays from 1pm-4:30pm  Wednesdays and Thursdays from 8am-11:30am  These hours are subject to change without notice.    [facility] will assist you with your medications when available as not all medications can be obtained through this program. Medications are added and deleted from the [facility] formulary as deemed necessary by the pharmaceutical companies. There is a chance that a medication you currently receive from [facility] may be discontinued. If this occurs an alternative medication will be sent to a local pharmacy for you to obtain and pay for.       If your medications are new or have changed, and you get your medications from the [facility] pharmacy ([facility]), you MUST talk to the pharmacy staff to sign the new prescription applications.  If you don't sign the applications we cannot get the medications for you. It usually takes 6-8 weeks for your medications to arrive.     The Pharmacy staff will call you when your medications are available. If  you don't hear from them you call [phone] and inquire about your medicines. You are responsible for obtaining your medications. You will have 30 days to come in and pick up your medications. If you don't pick up your medicines within those 30 days, those medicines may be placed on the self as samples and you will have to start all over again by completing the applications and waiting the 6-8 weeks for your medicines to arrive.    Bad teeth?   Ask about the Dental Clinic to get you in front of a local dentist when a dentist is available. They only extract teeth. No fillings, root canals, or dentures. You will have to provide your own transportation to and from the dental clinic (Ask about availability as these appointments are limited). If you are scheduled for the dentist and you fail to attend the appointment, you will not get another opportunity to go.       IMPORTANT: if you have high blood pressure please take your blood pressure medications before arriving to the [facility]. If your blood pressure is elevated the dental clinic WILL NOT extract any teeth and you will not have another opportunity to go to the clinic.         OR        You can contact one of the following dental clinics and you pay out   of pocket:     [facility] [phone]   [facility] [phone]   [facility] [phone]   [facility] [phone]   [facility] [phone]    DIABETES:   Do you have uncontrolled diabetes or you just want to learn more about your diabetes? Schedule with the Nurse navigator for our new 5-week Diabetes program. You will learn how to properly manage your diabetes: nutrition, exercise, medication therapy.      Eye exams for Diabetics.    Please let us know so we can add you to the list to see an eye doctor for free. These appointments are limited. You will receive a free eye exam and free glasses if needed. Unfortunately, if you are not a diabetic, we do  not have a free service for eye exams for you (yet!).  We do have information on where to go to get a huge discount on eye exams and glasses. Just Ask!    Sick visits:    If you are sick and it is not an emergency call the office to schedule an appointment to see the provider. Care in the office is FREE but charges will be applied if you go to the Emergency room.      If you feel better and do not wish to attend your sick appointment, please call the office and cancel to avoid a no-show.    Charges and cost items from the Foundation:    Most of our orders are covered by [facility] but there ARE SOME CHARGES for items such as radiology interpretations and anesthesiology during procedures and surgeries that are not covered under [place]. We do not have any control over those charges and we do not know who will charge you.     Behavior and emotional issues!    It is stressful to be sick, have an illness, take medications, not have a job, not have medical insurance, have family issues or just getting older! Schedule an appointment with our mental health provider. She is in the office Mondays and Wednesdays from 8am to 4pm.      You can also contact the following:    The national suicide hotline (1-800-273-TALK or 1-800-273-8255)    [facility]  [address] [place]  [address]  [phone]    [facility] ([facility]) - [place]  [address] [place] [address]  [phone]    Drug and Alcohol Addiction Issues!   It is hard to stop a poor habit but there is help out there. Please feel free to attend any of the following support groups to help you kick the habit or go to [facility] to be evaluated by the psychiatric team. Never give up!!    AlAnon meetings: [place]    [place] MONDAY 10:30 AM [employer] Al-[facility] [address]     [place] SATURDAY 8:00 PM [address] SATURDAY NIGHT [employer] Al-Anon  [facility] [address]       TID BITS:  Disposing of medications in the household trash: Almost all medicines can be thrown into your household trash. These include prescription and over-the-counter (OTC) drugs in pills, liquids, drops, patches, creams, and inhalers.    Follow these steps:  1) Remove the drugs from their original containers and mix them with something undesirable, such as used coffee grounds, dirt, or cat litter. This makes the medicine less appealing to children   and pets and unrecognizable to someone who might intentionally go through the trash looking for drugs.  2) Put the mixture in something you can close (a re-sealable zipper storage bag, empty can, or other container) to prevent the drug from leaking or spilling out.  3) Throw the container in the garbage.  4) Scratch out all your personal information on the empty medicine packaging to protect your identity and privacy. Throw the packaging away.    If you have a question about your medicine, ask your health care provider or pharmacist.      NO SHOW POLICY ~ If a patient has 3 no shows for an appointment with their Provider, Mental Health Provider, or the Nurse Navigator, you may be discharged from the practice for 6 months. Medication ordering will also be suspended. If the patient is discharged from the [facility], they can go to the [facility] [address] where they can be seen for their primary care needs plus obtain the same type of medications as they received at the [facility].To avoid being discharged the patient must call the office at [phone] 24 hours prior to their appointment if they need to cancel or reschedule, arrive to their appointments on time (preferrably 15 minutes early) (If you are late you will not be seen) and come to all scheduled appointments with the provider, mental health, and/or nurse navigator. If the patient is discharged from the practice,  they can apply to be re-established after 6 months.       FLU VACCINE:  The flu vaccine is available on 09/07/18 between 8am and 11 am at no cost to you by way of the flu vaccine drive thru at [facility] and [facility].

## 2018-08-22 NOTE — Progress Notes (Signed)
[facility] [place]  [address]  [phone] [phone] fax      08/22/2018    Reason for visit:   Chief Complaint   Patient presents with   ??? Establish Care   ??? Knee Pain       Patient: Kimberly Lopez, 31-Mar-1966, [id number]       Primary MD: Alla Feeling, NP    Subjective:   Kimberly Lopez, a 52 y.o. female, who presents for Establish Care and Knee Pain      HPI  Pt is a pleasant 52 y.o.yo BLACK OR [place] female who walks into the office today to establish care. Pt lives with her mom and sister , has 3 adult children, and is unemployed. Pt was working in [employer] but was fired due to taking off of work for knee pain. Pt does not take any chronic medications. She used to take albuterol for her chronic bronchitis but has not needed the inhaler in over a year. Pt admits to smoking Black and Milds on occassion, smoking marijuana weekly, occasional wine consumption but denies heroin and cocaine use.    Pts main complaint today is bilateral knee pain. Left knee worse than right. Rt knee pain 7/10, left 10/10. Pt has tried arthritis cream, lidocaine patches, and motrin with some relief at times. Walking worsens the pain.     HX:  Pt states she was admitted to [facility] back in 2016 for cardiac stents but was told 2 days later it was reflux. Never saw cardiologist due to finance. Pt denies any cardiac illnesses.    Reflux occurs when Pt consumes spicy foods. Pt avoids these foods. No therapy needed.    Mammo and [place] completed in June in [place], [place] - results came back negative, per pt.     Dx with chronic bronchitis but pt denies dx of COPD.    Pt states she knows she has high cholesterol and she used to take medication for it but has not had any medications in a long while.     Migraines not mentioned.    Past Medical History:   Diagnosis Date   ??? Asthma    ??? Asymmetric septal hypertrophy ([place])    ??? Chest pain, unspecified     ??? COPD (chronic obstructive pulmonary disease) ([place])    ??? GERD (gastroesophageal reflux disease)    ??? H/O vertigo 09/2012    admission with Heart block diagnosis   ??? Hyperlipidemia    ??? Knee joint pain     arthritis   ??? Migraine    ??? Obesity, unspecified    ??? Other second degree atrioventricular block    ??? Reflux esophagitis    ??? Sciatica 2014   ??? Shortness of breath    ??? Shoulder dislocation 1997       Past Surgical History:   Procedure Laterality Date   ??? HX GYN     ??? HX LEFT??SALPINGO-OOPHORECTOMY  2000    tubal pregnancy   ??? HX MENISCECTOMY Right 1990   ??? HX TUBAL LIGATION         Social History     Socioeconomic History   ??? Marital status: DIVORCED     Spouse name: Not on file   ??? Number of children: 3   ??? Years of education: 50   ??? Highest education level: Not on file   Occupational History   ??? Not on file  Social Needs   ??? Financial resource strain: Hard   ??? Food insecurity:     Worry: Often true     Inability: Not on file   ??? Transportation needs:     Medical: No     Non-medical: Not on file   Tobacco Use   ??? Smoking status: Current Some Day Smoker   ??? Smokeless tobacco: Never Used   ??? Tobacco comment: "one black and mild per day when stressed"   Substance and Sexual Activity   ??? Alcohol use: Yes     Alcohol/week: 1.0 standard drinks     Types: 1 Glasses of wine per week     Comment: rarely   ??? Drug use: Yes     Types: Marijuana   ??? Sexual activity: Not Currently     Partners: Male     Birth control/protection: Condom   Lifestyle   ??? Physical activity:     Days per week: Not on file     Minutes per session: Not on file   ??? Stress: Not on file   Relationships   ??? Social connections:     Talks on phone: Not on file     Gets together: Not on file     Attends religious service: Not on file     Active member of club or organization: Not on file     Attends meetings of clubs or organizations: Not on file     Relationship status: Not on file   ??? Intimate partner violence:      Fear of current or ex partner: Not on file     Emotionally abused: Not on file     Physically abused: Not on file     Forced sexual activity: Not on file   Other Topics Concern   ??? Not on file   Social History Narrative   ??? Not on file       No Known Allergies    No current outpatient medications on file prior to visit.     No current facility-administered medications on file prior to visit.           Objective:   Visit Vitals  BP 141/73 (BP 1 Location: Right arm)   Pulse 76   Temp 98.6 ??F (37 ??C)   Resp 14   Wt 192 lb 9.6 oz (87.4 kg)   SpO2 98%   BMI 35.23 kg/m??      Wt Readings from Last 3 Encounters:   08/22/18 192 lb 9.6 oz (87.4 kg)   08/02/18 180 lb (81.6 kg)   08/03/15 185 lb (83.9 kg)       Physical Exam   Constitutional: She is oriented to person, place, and time. She appears well-developed and well-nourished.   HENT:   Head: Normocephalic and atraumatic.   Neck: Normal range of motion. Neck supple.   Cardiovascular: Normal rate, regular rhythm and normal heart sounds.   Pulmonary/Chest: Effort normal and breath sounds normal. No respiratory distress. She has no wheezes. She has no rales.   Musculoskeletal: She exhibits no edema or deformity.   Noted pt to stand up slow and ambulate slow with grimacing. Gait is stable   Neurological: She is alert and oriented to person, place, and time.   Skin: Skin is warm, dry and intact.        Red indicates tattoos   Psychiatric: She has a normal mood and affect. Her behavior is normal. Judgment and thought content normal.  High spirited and pleasant         Assessment:    [name] who has risk factors including (see above previous medical hx) and:         ICD-10-CM ICD-9-CM    1. Encounter to establish care Z76.89 V65.8 CBC WITH AUTOMATED DIFF      CHLAMYDIA/NEISSERIA AMPLIFICATION      DRUG SCREEN, URINE      HEMOGLOBIN A1C WITH EAG      HEPATITIS PANEL, ACUTE      HIV 1/2 AG/AB, 4TH GENERATION,W RFLX CONFIRM      LIPID PANEL      VITAMIN D, 25 HYDROXY       TSH 3RD GENERATION      T4, FREE      T3, FREE      METABOLIC PANEL, COMPREHENSIVE      MAGNESIUM   2. Hypertriglyceridemia E78.1 272.1 atorvastatin (LIPITOR) 20 mg tablet      LIPID PANEL   3. Dyslipidemia E78.5 272.4 atorvastatin (LIPITOR) 20 mg tablet      LIPID PANEL   4. Chronic pain of both knees M25.561 719.46 diclofenac-miSOPROStol (ARTHROTEC 50) 50-200 mg-mcg per tablet    M25.562 338.29     G89.29     5. Hx of bronchitis Z87.09 V12.69 PULMONARY FUNCTION TEST   6. Gastroesophageal reflux disease without esophagitis K21.9 530.81    7. 2nd degree AV block I44.1 426.13 EKG, 12 LEAD, INITIAL      REFERRAL TO CARDIOLOGY   8. Vitamin D deficiency E55.9 268.9 ergocalciferol (ERGOCALCIFEROL) 50,000 unit capsule   9. Marijuana smoker F12.90 305.20    10. Current smoker F17.200 305.1 PULMONARY FUNCTION TEST   11. Colon cancer screening Z12.11 V76.51 OCCULT BLOOD IMMUNOASSAY,DIAGNOSTIC   12. Encounter for immunization Z23 V03.89 INFLUENZA VIRUS VAC QUAD,SPLIT,PRESV FREE SYRINGE IM   13. Counseling on health promotion and disease prevention Z71.89 V65.49        1. Encounter to establish care    - CBC WITH AUTOMATED DIFF; Future  - CHLAMYDIA/NEISSERIA AMPLIFICATION; Future  - DRUG SCREEN, URINE; Future  - HEMOGLOBIN A1C WITH EAG; Future  - HEPATITIS PANEL, ACUTE; Future  - HIV 1/2 AG/AB, 4TH GENERATION,W RFLX CONFIRM; Future  - LIPID PANEL; Future  - VITAMIN D, 25 HYDROXY; Future  - TSH 3RD GENERATION; Future  - T4, FREE; Future  - T3, FREE; Future  - METABOLIC PANEL, COMPREHENSIVE; Future  - MAGNESIUM; Future    2. Hypertriglyceridemia    - atorvastatin (LIPITOR) 20 mg tablet; Take 1 Tab by mouth daily. For cholesterol  Dispense: 30 Tab; Refill: 3    3. Dyslipidemia    - atorvastatin (LIPITOR) 20 mg tablet; Take 1 Tab by mouth daily. For cholesterol  Dispense: 30 Tab; Refill: 3    4. Chronic pain of both knees  2019 Rt knee xray revealed degenerative changes and small joint effusion.  Lt knee xray revealed osteophytes. Sampled arthrotec 50/200    - diclofenac-miSOPROStol (ARTHROTEC 50) 50-200 mg-mcg per tablet; Take 1 Tab by mouth two (2) times daily as needed (knee pain). For pain  Dispense: 90 Tab; Refill: 0    5. Hx of bronchitis    - PULMONARY FUNCTION TEST; Future    6. Gastroesophageal reflux disease without esophagitis  Instructed pt on the importance of avoiding spicy foods and any other food that may stir up reflux. Patient verbalized understanding.  No medication needed at this time    7. 2nd degree AV block  Will refer to cardio for consultation    2011 normal nuclear stress test.   2011 Holter monitor revealed 1st and 2nd degree AV block  2011 Normal echo  2016 EKG sinus bradycardia but otherwise normal    - EKG, 12 LEAD, INITIAL; Future  - REFERRAL TO CARDIOLOGY    8. Vitamin D deficiency    - ergocalciferol (ERGOCALCIFEROL) 50,000 unit capsule; Take 1 Cap by mouth every seven (7) days.  Dispense: 4 Cap; Refill: 3    9. Marijuana smoker      10. Current smoker    - PULMONARY FUNCTION TEST; Future    11. Colon cancer screening  Pt received FIT kit and educated on how to obtain sample and how to return sample. Pt verb understanding.    - OCCULT BLOOD IMMUNOASSAY,DIAGNOSTIC; Future    12. Encounter for immunization    - INFLUENZA VIRUS VAC QUAD,SPLIT,PRESV FREE SYRINGE IM    13. Counseling on health promotion and disease prevention  Instructed pt ie importance of scheduling Pap.      See additional info under plan      Written instructions followed our verbal discussion of all information discussed above, pending tests ordered and future goals/plans. Patient expressed understanding of current diagnosis, planned testing, follow up and if needed to contact the office for any questions or concerns prior to the next visit.     Plan:     Pt is not taking any chronic medications      Labs obtained to establish baseline, evaluate metabolic health,  nutritional status, vitamin deficiencies and screening for at risk items based on the demographics of the patient, previous medical history and current social practices.  Patient to follow up with Nurse Navigator to obtain lab results and make lifestyle and medication recommendations. Follow up labs will be completed to monitor improvement prior to their next visit if needed.      NN visit:  Jenny Reichmann, new patient appt with you on 08/29/18:  1) Vit D Def. E-scribed Vit D 50,000 units to [facility]. Pt to take one per week for 12 weeks. When completed pt needs to start taking 5000 units of over-the-counter Vitamin D3 daily for the rest of life. Sent to [facility] as the price is cheaper.  2) TG 173. Goal <150; LDL 178- e[facility]m [facility]   3) +Marijuana    Pt will need labs 1-2 weeks prior to f/u appt 12/03/17. Thank you    Orders Placed This Encounter   ??? Influenza virus vaccine (QUADRIVALENT PRES FREE SYRINGE) IM [id number]   ??? OCCULT BLOOD IMMUNOASSAY,DIAGNOSTIC     Standing Status:   Future     Standing Expiration Date:   09/22/2018   ??? CBC WITH AUTOMATED DIFF     Standing Status:   Future     Number of Occurrences:   1     Standing Expiration Date:   08/29/2018   ??? CHLAMYDIA/NEISSERIA AMPLIFICATION     Standing Status:   Future     Number of Occurrences:   1     Standing Expiration Date:   08/29/2018     Order Specific Question:   Specimen type/source     Answer:   Urine [258]   ??? DRUG SCREEN, URINE     Standing Status:   Future     Number of Occurrences:   1     Standing Expiration Date:   08/29/2018   ???  HEMOGLOBIN A1C WITH EAG     Standing Status:   Future     Number of Occurrences:   1     Standing Expiration Date:   08/29/2018   ??? HEPATITIS PANEL, ACUTE     Standing Status:   Future     Number of Occurrences:   1     Standing Expiration Date:   08/29/2018   ??? HIV 1/2 AG/AB, 4TH GENERATION,W RFLX CONFIRM     Standing Status:   Future     Number of Occurrences:   1      Standing Expiration Date:   08/29/2018   ??? LIPID PANEL     Standing Status:   Future     Number of Occurrences:   1     Standing Expiration Date:   08/29/2018   ??? VITAMIN D, 25 HYDROXY     Standing Status:   Future     Number of Occurrences:   1     Standing Expiration Date:   08/29/2018   ??? TSH 3RD GENERATION     Standing Status:   Future     Number of Occurrences:   1     Standing Expiration Date:   08/29/2018   ??? T4, FREE     Standing Status:   Future     Number of Occurrences:   1     Standing Expiration Date:   08/29/2018   ??? T3, FREE     Standing Status:   Future     Number of Occurrences:   1     Standing Expiration Date:   16/11/930   ??? METABOLIC PANEL, COMPREHENSIVE     Standing Status:   Future     Number of Occurrences:   1     Standing Expiration Date:   08/29/2018   ??? MAGNESIUM     Standing Status:   Future     Number of Occurrences:   1     Standing Expiration Date:   08/29/2018   ??? LIPID PANEL     Standing Status:   Future     Standing Expiration Date:   01/17/2019   ??? REFERRAL TO CARDIOLOGY     Referral Priority:   Routine     Referral Type:   Consultation     Referral Reason:   Specialty Services Required     Requested Specialty:   Cardiology     Number of Visits Requested:   1   ??? EKG, 12 LEAD, INITIAL     Standing Status:   Future     Standing Expiration Date:   09/22/2018     Order Specific Question:   Reason for Exam:     Answer:   hx of 1st and 2nd degree AV block   ??? PULMONARY FUNCTION TEST     Standing Status:   Future     Standing Expiration Date:   10/22/2018   ??? diclofenac-miSOPROStol (ARTHROTEC 50) 50-200 mg-mcg per tablet     Sig: Take 1 Tab by mouth two (2) times daily as needed (knee pain). For pain     Dispense:  90 Tab     Refill:  0     Order Specific Question:   Expiration Date     Answer:   10/19/2020     Order Specific Question:   Lot#     Answer:   [id number]     Order Specific Question:   Manufacturer  Answer:   [employer]   ??? atorvastatin (LIPITOR) 20 mg tablet      Sig: Take 1 Tab by mouth daily. For cholesterol     Dispense:  30 Tab     Refill:  3     DOH   ??? ergocalciferol (ERGOCALCIFEROL) 50,000 unit capsule     Sig: Take 1 Cap by mouth every seven (7) days.     Dispense:  4 Cap     Refill:  3     Current Outpatient Medications   Medication Sig Dispense Refill   ??? atorvastatin (LIPITOR) 20 mg tablet Take 1 Tab by mouth daily. For cholesterol 30 Tab 3   ??? ergocalciferol (ERGOCALCIFEROL) 50,000 unit capsule Take 1 Cap by mouth every seven (7) days. 4 Cap 3   ??? diclofenac-miSOPROStol (ARTHROTEC 50) 50-200 mg-mcg per tablet Take 1 Tab by mouth two (2) times daily as needed (knee pain). For pain 90 Tab 0       Follow-up and Dispositions    ?? Return in about 3 months (around 11/22/2018).       Donald Prose. Nikeshia Keetch, DNP, AGNP-C  [place]      I spent 60 minutes with the patient in face-to-face consultation, of which greater than 50% was spent in counseling and coordination of care as described above.

## 2018-08-22 NOTE — Progress Notes (Signed)
Patient name, date of birth and orders verified before specimen collection. Rt  hand is dry and intact. 23g butterfly  was utilized to collect specimen. Pt tolerated procedure well.Kimberly Lopez is a 52 y.o. female who presents for routine immunizations.   She denies any symptoms , reactions or allergies that would exclude them from being immunized today.  Risks and adverse reactions were discussed and the VIS was given to them. All questions were addressed.  She was observed for 20 min post injection. There were no reactions observed.    FIT kit given and collection of specimen demonstrated and explained. Instructed to write collection date on vial and enclose the lab order in envelope. Patient voiced understanding of all instructions and agrees to bring in or mail in enclosed envelope after collection.      Harland Dingwall

## 2018-08-22 NOTE — Progress Notes (Signed)
Kimberly Lopez, new patient appt with you on 08/29/18:  1) Vit D Def. E-scribed Vit D 50,000 units to [facility]. Pt to take one per week for 12 weeks. When completed pt needs to start taking 5000 units of over-the-counter Vitamin D3 daily for the rest of life. Sent to [place] as the price is cheaper.  2) TG 173. Goal <150; LDL 178- e-scribed atorvastatin 20mg  every day to [facility] under the [facility] program.   3) +Marijuana  Pt will need labs 1-2 weeks prior to f/u appt 12/03/17. Thank you

## 2018-08-22 NOTE — Progress Notes (Signed)
Progress Notes by Alla Feeling, NP at 08/22/18 0900                Author: Alla Feeling, NP  Service: --  Author Type: Nurse Practitioner       Filed: 08/26/18 1436  Encounter Date: 08/22/2018  Status: Signed          Editor: Alla Feeling, NP (Nurse Practitioner)                               [facility] [place]  [address]   [phone] office/[phone] fax         08/22/2018      Reason for visit:      Chief Complaint       Patient presents with        ?  Establish Care        ?  Knee Pain           Patient: Kimberly Lopez,  1965-12-04, [id number]         Primary MD: Alla Feeling, NP      Subjective:    Saul Fordyce, a 52 y.o. female , who presents for Establish Care and Knee Pain         HPI   Pt is a pleasant 52 y.o.yo BLACK OR [place] AMER[place]  female who walks into the office today to establish care. Pt lives with her mom and sister , has 3 adult children, and is unemployed. Pt was  working in [employer] but was fired due to taking off of work for knee pain. Pt does not take any chronic medications. She used to take albuterol for her chronic bronchitis but has not needed the inhaler in over a year. Pt admits to smoking Black and  Milds on occassion, smoking marijuana weekly, occasional wine consumption but denies heroin and cocaine use.      Pts main complaint today is bilateral knee pain. Left knee worse than right. Rt knee pain 7/10, left 10/10. Pt has tried arthritis cream, lidocaine patches, and motrin with some relief at times. Walking worsens the pain.       HX:   Pt states she was admitted to [facility] back in 2016 for cardiac stents but was told 2 days later it was reflux. Never saw cardiologist due to finance. Pt denies any cardiac illnesses.      Reflux occurs when Pt consumes spicy foods. Pt avoids these foods. No therapy needed.      Mammo and [place] completed in June in [place], [place] - results came back negative, per  pt.       Dx with chronic bronchitis but pt denies dx of COPD.      Pt states she knows she has high cholesterol and she used to take medication for it but has not had any medications in a long while.       Migraines not mentioned.        Past Medical History:        Diagnosis  Date         ?  Asthma       ?  Asymmetric septal hypertrophy (HCC)       ?  Chest pain, unspecified       ?  COPD (chronic obstructive pulmonary disease) ([place])       ?  GERD (gastroesophageal reflux disease)       ?  H/O vertigo  09/2012          admission with Heart block diagnosis         ?  Hyperlipidemia       ?  Knee joint pain            arthritis         ?  Migraine       ?  Obesity, unspecified       ?  Other second degree atrioventricular block       ?  Reflux esophagitis       ?  Sciatica  2014     ?  Shortness of breath           ?  Shoulder dislocation  1997             Past Surgical History:         Procedure  Laterality  Date          ?  HX GYN         ?  HX LEFT??SALPINGO-OOPHORECTOMY    2000          tubal pregnancy          ?  HX MENISCECTOMY  Right  1990          ?  HX TUBAL LIGATION                 Social History          Socioeconomic History         ?  Marital status:  DIVORCED              Spouse name:  Not on file         ?  Number of children:  3     ?  Years of education:  9     ?  Highest education level:  Not on file       Occupational History        ?  Not on file       Social Needs         ?  Financial resource strain:  Hard        ?  Food insecurity:              Worry:  Often true         Inability:  Not on file        ?  Transportation needs:              Medical:  No         Non-medical:  Not on file       Tobacco Use         ?  Smoking status:  Current Some Day Smoker     ?  Smokeless tobacco:  Never Used        ?  Tobacco comment: "one black and mild per day when stressed"       Substance and Sexual Activity         ?  Alcohol use:  Yes              Alcohol/week:  1.0 standard drinks         Types:  1  Glasses of wine per week             Comment: rarely         ?  Drug use:  Yes              Types:  Marijuana         ?  Sexual activity:  Not Currently              Partners:  Male         Birth control/protection:  Condom       Lifestyle        ?  Physical activity:              Days per week:  Not on file         Minutes per session:  Not on file         ?  Stress:  Not on file       Relationships        ?  Social connections:              Talks on phone:  Not on file         Gets together:  Not on file         Attends religious service:  Not on file         Active member of club or organization:  Not on file         Attends meetings of clubs or organizations:  Not on file         Relationship status:  Not on file        ?  Intimate partner violence:              Fear of current or ex partner:  Not on file         Emotionally abused:  Not on file         Physically abused:  Not on file         Forced sexual activity:  Not on file        Other Topics  Concern        ?  Not on file       Social History Narrative        ?  Not on file           No Known Allergies        No current outpatient medications on file prior to visit.          No current facility-administered medications on file prior to visit.                Objective:    Visit Vitals      BP  141/73 (BP 1 Location: Right arm)     Pulse  76     Temp  98.6 ??F (37 ??C)     Resp  14     Wt  192 lb 9.6 oz (87.4 kg)     SpO2  98%        BMI  35.23 kg/m??           Wt Readings from Last 3 Encounters:        08/22/18  192 lb 9.6 oz (87.4 kg)     08/02/18  180 lb (81.6 kg)        08/03/15  185 lb (83.9 kg)           Physical Exam    Constitutional: She is oriented to person, place, and time. She appears well-developed and well-nourished.    HENT:    Head: Normocephalic and atraumatic.  Neck: Normal range of motion. Neck supple.    Cardiovascular: Normal rate, regular rhythm and normal heart sounds.    Pulmonary/Chest: Effort normal and breath sounds normal. No  respiratory distress. She has no wheezes. She has no rales.   Musculoskeletal:  She exhibits no edema or deformity.   Noted pt to stand up slow and ambulate slow with grimacing. Gait is stable   Neurological: She is alert and oriented to person, place, and time.    Skin: Skin is warm, dry and intact.          Red indicates tattoos   Psychiatric: She has a normal mood and affect. Her behavior is normal. Judgment and thought content normal.   High spirited and pleasant              Assessment:     MATSUE STROM who has risk factors including (see above previous medical hx) and:                    ICD-10-CM  ICD-9-CM             1.  Encounter to establish care  Z76.89  V65.8  CBC WITH AUTOMATED DIFF                CHLAMYDIA/NEISSERIA AMPLIFICATION           DRUG SCREEN, URINE           HEMOGLOBIN A1C WITH EAG           HEPATITIS PANEL, ACUTE           HIV 1/2 AG/AB, 4TH GENERATION,W RFLX CONFIRM           LIPID PANEL           VITAMIN D, 25 HYDROXY           TSH 3RD GENERATION           T4, FREE           T3, FREE           METABOLIC PANEL, COMPREHENSIVE           MAGNESIUM           2.  Hypertriglyceridemia  E78.1  272.1  atorvastatin (LIPITOR) 20 mg tablet                LIPID PANEL           3.  Dyslipidemia  E78.5  272.4  atorvastatin (LIPITOR) 20 mg tablet                LIPID PANEL           4.  Chronic pain of both knees  M25.561  719.46  diclofenac-miSOPROStol (ARTHROTEC 50) 50-200 mg-mcg per tablet            M25.562  338.29         G89.29               5.  Hx of bronchitis  Z87.09  V12.69  PULMONARY FUNCTION TEST     6.  Gastroesophageal reflux disease without esophagitis  K21.9  530.81       7.  2nd degree AV block  I44.1  426.13  EKG, 12 LEAD, INITIAL                REFERRAL TO CARDIOLOGY           8.  Vitamin D deficiency  E55.9  268.9  ergocalciferol (ERGOCALCIFEROL) 50,000 unit capsule     9.  Marijuana smoker  F12.90  305.20       10.  Current smoker  F17.200  305.1  PULMONARY FUNCTION TEST     11.   Colon cancer screening  Z12.11  V76.51  OCCULT BLOOD IMMUNOASSAY,DIAGNOSTIC     12.  Encounter for immunization  Z23  V03.89  INFLUENZA VIRUS VAC QUAD,SPLIT,PRESV FREE SYRINGE IM           13.  Counseling on health promotion and disease prevention  Z71.89  V65.49             1. Encounter to establish care      - CBC WITH AUTOMATED DIFF; Future   - CHLAMYDIA/NEISSERIA AMPLIFICATION; Future   - DRUG SCREEN, URINE; Future   - HEMOGLOBIN A1C WITH EAG; Future   - HEPATITIS PANEL, ACUTE; Future   - HIV 1/2 AG/AB, 4TH GENERATION,W RFLX CONFIRM; Future   - LIPID PANEL; Future   - VITAMIN D, 25 HYDROXY; Future   - TSH 3RD GENERATION; Future   - T4, FREE; Future   - T3, FREE; Future   - METABOLIC PANEL, COMPREHENSIVE; Future   - MAGNESIUM; Future      2. Hypertriglyceridemia      - atorvastatin (LIPITOR) 20 mg tablet; Take 1 Tab by mouth daily. For cholesterol  Dispense: 30 Tab; Refill: 3      3. Dyslipidemia      - atorvastatin (LIPITOR) 20 mg tablet; Take 1 Tab by mouth daily. For cholesterol  Dispense: 30 Tab; Refill: 3      4. Chronic pain of both knees   2019 Rt knee xray revealed degenerative changes and small joint effusion. Lt knee xray revealed osteophytes. Sampled arthrotec 50/200      - diclofenac-miSOPROStol (ARTHROTEC 50) 50-200 mg-mcg per tablet; Take 1 Tab by mouth two (2) times daily as needed (knee pain). For pain  Dispense: 90 Tab; Refill: 0      5. Hx of bronchitis      - PULMONARY FUNCTION TEST; Future      6. Gastroesophageal reflux disease without esophagitis   Instructed pt on the importance of avoiding spicy foods and any other food that may stir up reflux. Patient verbalized understanding.   No medication needed at this time      7. 2nd degree AV block   Will refer to cardio for consultation      2011 normal nuclear stress test.    2011 Holter monitor revealed 1st and 2nd degree AV block   2011 Normal echo   2016 EKG sinus bradycardia but otherwise normal      - EKG, 12 LEAD, INITIAL; Future   -  REFERRAL TO CARDIOLOGY      8. Vitamin D deficiency      - ergocalciferol (ERGOCALCIFEROL) 50,000 unit capsule; Take 1 Cap by mouth every seven (7) days.  Dispense: 4 Cap; Refill: 3      9. Marijuana smoker         10. Current smoker      - PULMONARY FUNCTION TEST; Future      11. Colon cancer screening   Pt received FIT kit and educated on how to obtain sample and how to return sample. Pt verb understanding.      - OCCULT BLOOD IMMUNOASSAY,DIAGNOSTIC; Future      12. Encounter for immunization      - INFLUENZA VIRUS VAC QUAD,SPLIT,PRESV FREE SYRINGE  IM      13. Counseling on health promotion and disease prevention   Instructed pt ie importance of scheduling Pap.         See additional info under plan         Written instructions followed our verbal discussion of all information discussed above, pending tests ordered and future goals/plans. Patient expressed understanding of current diagnosis,  planned testing, follow up and if needed to contact the office for any questions or concerns prior to the next visit.       Plan:       Pt is not taking any chronic medications         Labs obtained to establish baseline, evaluate metabolic health, nutritional status, vitamin deficiencies and screening for at risk items based on the demographics of the patient, previous  medical history and current social practices.  Patient to follow up with Nurse Navigator to obtain lab results and make lifestyle and medication recommendations. Follow up labs will be completed to monitor improvement prior to their next visit if needed.         NN visit:   Jenny Reichmann, new patient appt with you on 08/29/18:   1) Vit D Def. E-scribed Vit D 50,000 units to [facility]. Pt to take one per week for 12 weeks. When completed pt needs to start taking 5000 units of over-the-counter Vitamin D3 daily for the rest of life. Sent to [place] as the price is cheaper.   2) TG 173. Goal <150; LDL 178- [place] [place] [facility] [place].    3) +Marijuana      Pt will need labs 1-2 weeks prior to f/u appt 12/03/17. Thank you        Orders Placed This Encounter        ?  Influenza virus vaccine (QUADRIVALENT PRES FREE SYRINGE) IM [id number]     ?  OCCULT BLOOD IMMUNOASSAY,DIAGNOSTIC              Standing Status:    Future         Standing Expiration Date:    09/22/2018        ?  CBC WITH AUTOMATED DIFF              Standing Status:    Future         Number of Occurrences:    1         Standing Expiration Date:    08/29/2018        ?  CHLAMYDIA/NEISSERIA AMPLIFICATION              Standing Status:    Future         Number of Occurrences:    1         Standing Expiration Date:    08/29/2018         Order Specific Question:    Specimen type/source         Answer:    Urine [258]        ?  DRUG SCREEN, URINE              Standing Status:    Future         Number of Occurrences:    1         Standing Expiration Date:    08/29/2018        ?  HEMOGLOBIN A1C WITH EAG  Standing Status:    Future         Number of Occurrences:    1         Standing Expiration Date:    08/29/2018        ?  HEPATITIS PANEL, ACUTE              Standing Status:    Future         Number of Occurrences:    1         Standing Expiration Date:    08/29/2018        ?  HIV 1/2 AG/AB, 4TH GENERATION,W RFLX CONFIRM              Standing Status:    Future         Number of Occurrences:    1         Standing Expiration Date:    08/29/2018        ?  LIPID PANEL              Standing Status:    Future         Number of Occurrences:    1         Standing Expiration Date:    08/29/2018        ?  VITAMIN D, 25 HYDROXY              Standing Status:    Future         Number of Occurrences:    1         Standing Expiration Date:    08/29/2018        ?  TSH 3RD GENERATION              Standing Status:    Future         Number of Occurrences:    1         Standing Expiration Date:    08/29/2018        ?  T4, FREE              Standing Status:    Future         Number of Occurrences:     1         Standing Expiration Date:    08/29/2018        ?  T3, FREE              Standing Status:    Future         Number of Occurrences:    1         Standing Expiration Date:    08/29/2018        ?  METABOLIC PANEL, COMPREHENSIVE              Standing Status:    Future         Number of Occurrences:    1         Standing Expiration Date:    08/29/2018        ?  MAGNESIUM              Standing Status:    Future         Number of Occurrences:    1         Standing Expiration Date:    08/29/2018        ?  LIPID PANEL  Standing Status:    Future         Standing Expiration Date:    01/17/2019        ?  REFERRAL TO CARDIOLOGY              Referral Priority:    Routine         Referral Type:    Consultation         Referral Reason:    Specialty Services Required         Requested Specialty:    Cardiology         Number of Visits Requested:    1        ?  EKG, 12 LEAD, INITIAL              Standing Status:    Future         Standing Expiration Date:    09/22/2018         Order Specific Question:    Reason for Exam:         Answer:    hx of 1st and 2nd degree AV block        ?  PULMONARY FUNCTION TEST              Standing Status:    Future         Standing Expiration Date:    10/22/2018        ?  diclofenac-miSOPROStol (ARTHROTEC 50) 50-200 mg-mcg per tablet             Sig: Take 1 Tab by mouth two (2) times daily as needed (knee pain). For pain         Dispense:  90 Tab         Refill:  0              Order Specific Question:    Expiration Date         Answer:    10/19/2020         Order Specific Question:    Lot#         Answer:    [id number]         Order Specific Question:    Manufacturer         Answer:    [employer]        ?  atorvastatin (LIPITOR) 20 mg tablet             Sig: Take 1 Tab by mouth daily. For cholesterol         Dispense:  30 Tab         Refill:  3         DOH        ?  ergocalciferol (ERGOCALCIFEROL) 50,000 unit capsule             Sig: Take 1 Cap by mouth every seven (7) days.         Dispense:   4 Cap             Refill:  3          Current Outpatient Medications          Medication  Sig  Dispense  Refill           ?  atorvastatin (LIPITOR) 20 mg tablet  Take 1 Tab by mouth daily. For cholesterol  30 Tab  3     ?  ergocalciferol (ERGOCALCIFEROL) 50,000 unit capsule  Take 1 Cap by mouth every seven (7) days.  4 Cap  3           ?  diclofenac-miSOPROStol (ARTHROTEC 50) 50-200 mg-mcg per tablet  Take 1 Tab by mouth two (2) times daily as needed (knee pain). For pain  90 Tab  0             Follow-up and Dispositions      ??  Return in about 3 months (around 11/22/2018).             Donald Prose. Elyjah Hazan, DNP, AGNP-C   [place]         I spent 60 minutes with the patient in face-to-face consultation, of which greater than 50% was spent in counseling and coordination of care as described above.

## 2018-08-22 NOTE — Progress Notes (Signed)
Patient name, date of birth and orders verified before specimen collection. Rt  hand is dry and intact. 23g butterfly  was utilized to collect specimen. Pt tolerated procedure well.Kimberly Lopez is a 52 y.o. female who presents for routine immunizations.   She denies any symptoms , reactions or allergies that would exclude them from being immunized today.  Risks and adverse reactions were discussed and the VIS was given to them. All questions were addressed.  She was observed for 20 min post injection. There were no reactions observed.    FIT kit given and collection of specimen demonstrated and explained. Instructed to write collection date on vial and enclose the lab order in envelope. Patient voiced understanding of all instructions and agrees to bring in or mail in enclosed envelope after collection.      Donna Hill

## 2018-08-23 LAB — HEPATITIS PANEL, ACUTE
HCV Ab: 0.03 Index (ref <0.80)
Hep A IgM: NEGATIVE
Hep B Core Ab, IgM: NEGATIVE
Hep B surface Ag Interp.: NEGATIVE
Hep C virus Ab Interp.: NEGATIVE
Hepatitis A, IgM: NEGATIVE
Hepatitis B Surface Ag: 0.53 Index (ref <1.00)
Hepatitis B core, IgM: NEGATIVE
Hepatitis B surface Ag: 0.53 Index (ref <1.00)
Hepatitis C Ab: NEGATIVE
Hepatitis C virus Ab: 0.03 Index (ref <0.80)

## 2018-08-23 LAB — HIV 1/2 ANTIGEN/ANTIBODY, FOURTH GENERATION W/RFL: Interpretation: NONREACTIVE

## 2018-08-23 LAB — HIV 1/2 AG/AB, 4TH GENERATION,W RFLX CONFIRM: HIV 1/2 Interpretation: NONREACTIVE

## 2018-08-24 LAB — C.TRACHOMATIS N.GONORRHOEAE DNA
Chlamydia trachomatis, NAA: NEGATIVE
Neisseria Gonorrhoeae, NAA: NEGATIVE

## 2018-08-24 LAB — CHLAMYDIA/NEISSERIA AMPLIFICATION
Chlamydia amplification: NEGATIVE
N. gonorrhoeae amplification: NEGATIVE

## 2018-08-26 MED ORDER — ATORVASTATIN 20 MG TAB
20 mg | ORAL_TABLET | Freq: Every day | ORAL | 3 refills | Status: AC
Start: 2018-08-26 — End: ?

## 2018-08-26 MED ORDER — ERGOCALCIFEROL (VITAMIN D2) 50,000 UNIT CAP
1250 mcg (50,000 unit) | ORAL_CAPSULE | ORAL | 3 refills | Status: AC
Start: 2018-08-26 — End: ?

## 2018-08-26 NOTE — Telephone Encounter (Signed)
Called patient to see if she had her EKG done.  Patient says she will get it done when she have her PFT 08/28/18.

## 2018-08-28 ENCOUNTER — Inpatient Hospital Stay: Admit: 2018-08-28 | Attending: Nurse Practitioner | Primary: Family Medicine

## 2018-08-28 ENCOUNTER — Inpatient Hospital Stay: Admit: 2018-08-28 | Primary: Family Medicine

## 2018-08-28 DIAGNOSIS — I441 Atrioventricular block, second degree: Secondary | ICD-10-CM

## 2018-08-28 DIAGNOSIS — F17200 Nicotine dependence, unspecified, uncomplicated: Secondary | ICD-10-CM

## 2018-08-28 NOTE — Progress Notes (Signed)
Called patient to give negative fit test results.  Patient aware.

## 2018-08-28 NOTE — Progress Notes (Signed)
Donna, Please notify pt their FIT was negative. Thank you

## 2018-08-28 NOTE — Progress Notes (Signed)
Called patient to give normal PFT results.  Patient aware and she will work on the smoking.

## 2018-08-28 NOTE — Progress Notes (Signed)
Called patient to give negative fit test results.  Patient aware.

## 2018-08-28 NOTE — Progress Notes (Signed)
Called patient to give normal PFT results.  Patient aware and she will work on the smoking.

## 2018-08-28 NOTE — Progress Notes (Signed)
Kimberly Lopez, please notify pt her PFT is normal. No signs of COPD. Strongly encourage pt to quit smoking Black n Milds. Thank you

## 2018-08-29 ENCOUNTER — Ambulatory Visit: Admit: 2018-08-29 | Payer: PRIVATE HEALTH INSURANCE | Primary: Family Medicine

## 2018-08-29 ENCOUNTER — Ambulatory Visit: Primary: Family Medicine

## 2018-08-29 DIAGNOSIS — Z559 Problems related to education and literacy, unspecified: Secondary | ICD-10-CM

## 2018-08-29 LAB — EKG 12-LEAD
Atrial Rate: 71 {beats}/min
P Axis: 18 degrees
P-R Interval: 394 ms
Q-T Interval: 414 ms
QRS Duration: 82 ms
QTc Calculation (Bazett): 449 ms
R Axis: 7 degrees
T Axis: -5 degrees
Ventricular Rate: 71 {beats}/min

## 2018-08-29 LAB — EKG, 12 LEAD, INITIAL
Atrial Rate: 71 {beats}/min
Calculated P Axis: 18 degrees
Calculated R Axis: 7 degrees
Calculated T Axis: -5 degrees
P-R Interval: 394 ms
Q-T Interval: 414 ms
QRS Duration: 82 ms
QTC Calculation (Bezet): 449 ms
Ventricular Rate: 71 {beats}/min

## 2018-08-29 NOTE — Progress Notes (Signed)
Donna, please notify pt her PFT is normal. No signs of COPD. Strongly encourage pt to quit smoking Black n Milds. Thank you

## 2018-08-29 NOTE — Progress Notes (Signed)
Kimberly FritzKristina L Lopez is a 52 y.o. female is here for  initial visit with the Nurse Navigator for orientation appointment to learn on how the [facility] works and the services we can provide.    We have reviewed [facility]:   Hours of operation and number to contact us.     Inclement weather policy     No Show Policy     IF YOU ARE TAKING MEDICINE FOR HIGH BLOOD PRESSURE~ PLEASE TAKE  YOUR 2 HOURS PRIOR TO ANY OFFICE VISIT TO SEE YOUR PROVIDER OR THE   NURSES.     ~~~PLEASE BRING ALL MEDICATIONS YOU ARE TAKING TO YOUR VISIT WITH YOUR PROVIDER OR NURSE NAVIGATOR~~~     Lab work (Hours to have lab work drawn). Pt is aware to come 2 weeks prior to next week.     Medication Policies including times to pick up med's, number to dial to reach your            pharmacy technician and the paperwork that must be signed to obtain med's. Please check in with your technician each time you are in the building.     Dental Clinic      Diabetic eye exams     Sick visits     [facility] and phone contact number.   YOU MUST SEE [facility] BEFORE BEING REFERRED TO A SPECIALIST!     Mental Health appointments with Ms. Earlene PlaterDavis are scheduled on Mondays and Wednesdays between the hours of 8:00-3:00. Please call the main number at [phone] to schedule an appointment. Pt declines an appt with [name] at this time.     Suicide Hotline Number and how to contact AA/NA for meetings for help with addictions.      We have reviewed Ms. Frediani's lab work today as requested by Restaurant manager, fast foodrovider.    [place], Maryanna ShapeAmanda M, NP  Sumeya Yontz, Stacie Acresynthia S, RN      ??      Cindy, new patient appt with you on 08/29/18:   1) Vit D Def. E-scribed Vit D 50,000 units to [facility]. Pt to take one per week for 12 weeks. When completed pt needs to start taking 5000 units of over-the-counter Vitamin D3 daily for the rest of life. [place] [place]   2) TG 173. Goal <150; LDL 178- e[facility]  [facility].    3) +Marijuana   Pt will need labs 1-2 weeks prior to f/u appt 12/03/17. Thank you    Pt reports eating "a lot of fried food". She will look into getting an air fryer in the next few weeks.  Pt given handout about Vitamin D and imp of taking as prescribed. Pt will cut down and fried foods and sugar. All questions answered and concerns addressed.

## 2018-08-29 NOTE — Progress Notes (Signed)
 Progress Notes by Kimberly Montie RAMAN, RN at 08/29/18 1000                Author: Durel Montie RAMAN, RN  Service: --  Author Type: Registered Nurse       Filed: 08/29/18 1632  Encounter Date: 08/29/2018  Status: Signed          Editor: Kimberly Montie RAMAN, RN (Registered Nurse)               Kimberly Lopez is a 52 y.o.  female is here for  initial visit with the Nurse Navigator for orientation appointment to learn on how the [facility] works and the services we can  provide.      We have reviewed [facility]:    Hours of operation and number to contact us .       Inclement weather policy       No Show Policy       IF YOU ARE TAKING MEDICINE FOR HIGH BLOOD PRESSURE~ PLEASE TAKE  YOUR 2 HOURS PRIOR TO ANY OFFICE VISIT TO SEE YOUR PROVIDER OR THE    NURSES.       ~~~PLEASE BRING ALL MEDICATIONS YOU ARE TAKING TO YOUR VISIT WITH YOUR PROVIDER OR NURSE NAVIGATOR~~~       Lab work (Hours to have lab work drawn). Pt is aware to come 2 weeks prior to next week.       Medication Policies including times to pick up med's, number to dial to reach your            pharmacy technician and the paperwork that must be signed to obtain med's. Please check in with your technician each time you are in the building.       Dental Clinic        Diabetic eye exams       Sick visits       [facility] Program including location at [facility] and phone contact number.    YOU MUST SEE [facility] BEFORE BEING REFERRED TO A SPECIALIST!       Mental Health appointments with Kimberly Lopez are scheduled on Mondays and Wednesdays between the hours of 8:00-3:00. Please call the main number at [phone] to schedule an appointment. Pt declines an appt with Kimberly Lopez at this time.       Suicide Hotline Number and how to contact AA/NA for meetings for help with addictions.         We have reviewed Kimberly Lopez's lab work today as requested by Kimberly Lopez, fast food.        Kimberly Lopez, Kimberly HERO, NP    Kimberly Montie RAMAN, RN                          Kimberly Lopez, new patient appt with you on 08/29/18:   1) Vit D Def.  E-scribed Vit D 50,000 units to [facility]. Pt to take one per week  for 12 weeks. When completed pt needs to start taking 5000 units of over-the-counter Vitamin D3 daily for the rest of life. Sent to [place] as the price is cheaper.   2) TG 173. Goal <150; LDL 178- e-scribed atorvastatin 20mg  every day to [facility]  under the [facility] program.   3) +Marijuana   Pt will need labs 1-2 weeks prior to f/u appt 12/03/17. Thank you      Pt reports eating a lot of fried food. She will look into getting an air fryer  in the next few weeks.   Pt given handout about Vitamin D and imp of taking as prescribed. Pt will cut down and fried foods and sugar. All questions answered and concerns addressed.

## 2018-09-02 LAB — OCCULT BLOOD DIAGNOSTIC: OCCULT BLOOD, FECAL, IA: NEGATIVE

## 2018-09-02 LAB — OCCULT BLOOD IMMUNOASSAY,DIAGNOSTIC: Occult blood fecal, by IA: NEGATIVE

## 2018-09-04 ENCOUNTER — Ambulatory Visit
Admit: 2018-09-04 | Discharge: 2018-09-04 | Payer: PRIVATE HEALTH INSURANCE | Attending: Specialist | Primary: Family Medicine

## 2018-09-04 ENCOUNTER — Ambulatory Visit: Attending: Specialist | Primary: Family Medicine

## 2018-09-04 DIAGNOSIS — R9431 Abnormal electrocardiogram [ECG] [EKG]: Secondary | ICD-10-CM

## 2018-09-04 NOTE — Progress Notes (Signed)
HISTORY OF PRESENT ILLNESS  Kimberly Lopez is a 52 y.o. female.    08/2018  Patient is seen today for new patient evaluation.  She was reported abnormal EKG.  It has reported first-degree heart block      New Patient   The history is provided by the patient. Associated symptoms include shortness of breath. Pertinent negatives include no chest pain, no abdominal pain and no headaches.   Shortness of Breath   The history is provided by the patient. This is a new problem. The problem occurs frequently.The current episode started more than 1 week ago. The problem has been gradually worsening. Associated symptoms include leg swelling. Pertinent negatives include no fever, no headaches, no cough, no sputum production, no hemoptysis, no wheezing, no PND, no orthopnea, no chest pain, no vomiting, no abdominal pain, no rash and no claudication. The problem's precipitants include exercise (Walking, exertion, activity).   Palpitations    The history is provided by the patient. This is a new problem. The current episode started more than 1 week ago. The problem has been gradually worsening. The problem occurs every several days. The problem is associated with nothing. Associated symptoms include shortness of breath. Pertinent negatives include no fever, no chest pain, no claudication, no orthopnea, no PND, no abdominal pain, no nausea, no vomiting, no headaches, no dizziness, no weakness, no cough, no hemoptysis and no sputum production.       Review of Systems   Constitutional: Negative for chills and fever.   HENT: Negative for nosebleeds.    Eyes: Negative for blurred vision and double vision.   Respiratory: Positive for shortness of breath. Negative for cough, hemoptysis, sputum production and wheezing.    Cardiovascular: Positive for palpitations and leg swelling. Negative for chest pain, orthopnea, claudication and PND.   Gastrointestinal: Negative for abdominal pain, heartburn, nausea and vomiting.    Musculoskeletal: Positive for joint pain. Negative for myalgias.   Skin: Negative for rash.   Neurological: Negative for dizziness, weakness and headaches.   Endo/Heme/Allergies: Does not bruise/bleed easily.     Family History   Problem Relation Age of Onset   ??? Hypertension Mother    ??? Dementia Mother    ??? Suicide Father    ??? Depression Father    ??? Anxiety Sister    ??? Hypertension Sister    ??? Breast Cancer Maternal Aunt    ??? Hypertension Maternal Grandmother    ??? Cancer Maternal Grandmother    ??? Alzheimer Maternal Grandmother    ??? Breast Cancer Maternal Grandmother    ??? Arrhythmia Daughter    ??? Diabetes Son    ??? Colon Cancer Cousin        Past Medical History:   Diagnosis Date   ??? Asthma    ??? Asymmetric septal hypertrophy (HCC)    ??? Chest pain, unspecified    ??? COPD (chronic obstructive pulmonary disease) (HCC) 08/2018    PFT revealed normal study   ??? GERD (gastroesophageal reflux disease)    ??? H/O vertigo 09/2012    admission with Heart block diagnosis   ??? Hyperlipidemia    ??? Knee joint pain     arthritis   ??? Migraine    ??? Obesity, unspecified    ??? Other second degree atrioventricular block    ??? Reflux esophagitis    ??? Sciatica 2014   ??? Shortness of breath    ??? Shoulder dislocation 1997       Past Surgical History:  Procedure Laterality Date   ??? HX GYN     ??? HX LEFT??SALPINGO-OOPHORECTOMY  2000    tubal pregnancy   ??? HX MENISCECTOMY Right 1990   ??? HX TUBAL LIGATION         Social History     Tobacco Use   ??? Smoking status: Former Smoker     Types: Cigars     Last attempt to quit: 09/03/2018   ??? Smokeless tobacco: Never Used   ??? Tobacco comment: "one black and mild per day when stressed"   Substance Use Topics   ??? Alcohol use: Yes     Alcohol/week: 1.0 standard drinks     Types: 1 Glasses of wine per week     Frequency: Monthly or less     Drinks per session: 1 or 2     Binge frequency: Never     Comment: rarely       No Known Allergies    Prior to Admission medications     Medication Sig Start Date End Date Taking? Authorizing Provider   atorvastatin (LIPITOR) 20 mg tablet Take 1 Tab by mouth daily. For cholesterol 08/26/18  Yes Northern, [place], NP   ergocalciferol (ERGOCALCIFEROL) 50,000 unit capsule Take 1 Cap by mouth every seven (7) days. 08/26/18  Yes Northern, Maryanna Shape, NP   diclofenac-miSOPROStol (ARTHROTEC 50) 50-200 mg-mcg per tablet Take 1 Tab by mouth two (2) times daily as needed (knee pain). For pain 08/22/18  Yes Northern, Maryanna Shape, NP         Visit Vitals  BP 136/72   Pulse 85   Ht 5\' 2"  (1.575 m)   Wt 87.6 kg (193 lb 3.2 oz)   BMI 35.34 kg/m??         Physical Exam   Constitutional: She is oriented to person, place, and time. She appears well-developed and well-nourished.   HENT:   Head: Normocephalic and atraumatic.   Eyes: Conjunctivae are normal.   Neck: Neck supple. No JVD present. No tracheal deviation present. No thyromegaly present.   Cardiovascular: Normal rate and regular rhythm. PMI is not displaced. Exam reveals no gallop, no S3 and no decreased pulses.   No murmur heard.  Pulmonary/Chest: No respiratory distress. She has no wheezes. She has no rales. She exhibits no tenderness.   Abdominal: Soft. There is no tenderness.   Musculoskeletal: She exhibits no edema.   Neurological: She is alert and oriented to person, place, and time.   Skin: Skin is warm.   Psychiatric: She has a normal mood and affect.       Ms. Ferrebee has a reminder for a "due or due soon" health maintenance. I have asked that she contact her primary care provider for follow-up on this health maintenance.    I Have personally reviewed recent relevant labs available and discussed with patient  I have personally reviewed patients ekg done at other facility.  I have personally reviewed patient's records available from hospital and other providers and incorporated findings in patient care.      Assessment         ICD-10-CM ICD-9-CM    1. Abnormal EKG R94.31 794.31 ECHO ADULT COMPLETE       NUCLEAR CARDIAC STRESS TEST      CARDIAC EVENT MONITOR    Long first-degree heart block.   2. First degree heart block I44.0 426.11 ECHO ADULT COMPLETE      NUCLEAR CARDIAC STRESS TEST      CARDIAC  EVENT MONITOR    With prolonged.  Likely conduction system disorder.  Rule out sick sinus syndrome with complaint of palpitation   3. Hyperlipidemia, unspecified hyperlipidemia type E78.5 272.4     Severely elevated LDL now on statin   4. Type 2 diabetes mellitus without complication, without long-term current use of insulin (HCC) E11.9 250.00    5. SOB (shortness of breath) on exertion R06.02 786.05 ECHO ADULT COMPLETE      NUCLEAR CARDIAC STRESS TEST      CARDIAC EVENT MONITOR    Rule out arrhythmia, CHF, ischemia   6. 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 Arthritis M19.90 716.90     Severe bilateral knee pain difficulty ambulating       There are no discontinued medications.    No orders of the defined types were placed in this encounter.

## 2018-09-04 NOTE — Progress Notes (Signed)
HISTORY OF PRESENT ILLNESS  Kimberly FritzKristina L Lopez is a 52 y.o. female.    08/2018  Patient is seen today for new patient evaluation.  She was reported abnormal EKG.  It has reported first-degree heart block      New Patient   The history is provided by the patient. Associated symptoms include shortness of breath. Pertinent negatives include no chest pain, no abdominal pain and no headaches.   Shortness of Breath   The history is provided by the patient. This is a new problem. The problem occurs frequently.The current episode started more than 1 week ago. The problem has been gradually worsening. Associated symptoms include leg swelling. Pertinent negatives include no fever, no headaches, no cough, no sputum production, no hemoptysis, no wheezing, no PND, no orthopnea, no chest pain, no vomiting, no abdominal pain, no rash and no claudication. The problem's precipitants include exercise (Walking, exertion, activity).   Palpitations    The history is provided by the patient. This is a new problem. The current episode started more than 1 week ago. The problem has been gradually worsening. The problem occurs every several days. The problem is associated with nothing. Associated symptoms include shortness of breath. Pertinent negatives include no fever, no chest pain, no claudication, no orthopnea, no PND, no abdominal pain, no nausea, no vomiting, no headaches, no dizziness, no weakness, no cough, no hemoptysis and no sputum production.       Review of Systems   Constitutional: Negative for chills and fever.   HENT: Negative for nosebleeds.    Eyes: Negative for blurred vision and double vision.   Respiratory: Positive for shortness of breath. Negative for cough, hemoptysis, sputum production and wheezing.    Cardiovascular: Positive for palpitations and leg swelling. Negative for chest pain, orthopnea, claudication and PND.   Gastrointestinal: Negative for abdominal pain, heartburn, nausea and vomiting.   Musculoskeletal:  Positive for joint pain. Negative for myalgias.   Skin: Negative for rash.   Neurological: Negative for dizziness, weakness and headaches.   Endo/Heme/Allergies: Does not bruise/bleed easily.     Family History   Problem Relation Age of Onset   ??? Hypertension Mother    ??? Dementia Mother    ??? Suicide Father    ??? Depression Father    ??? Anxiety Sister    ??? Hypertension Sister    ??? Breast Cancer Maternal Aunt    ??? Hypertension Maternal Grandmother    ??? Cancer Maternal Grandmother    ??? Alzheimer Maternal Grandmother    ??? Breast Cancer Maternal Grandmother    ??? Arrhythmia Daughter    ??? Diabetes Son    ??? Colon Cancer Cousin        Past Medical History:   Diagnosis Date   ??? Asthma    ??? Asymmetric septal hypertrophy (HCC)    ??? Chest pain, unspecified    ??? COPD (chronic obstructive pulmonary disease) (HCC) 08/2018    PFT revealed normal study   ??? GERD (gastroesophageal reflux disease)    ??? H/O vertigo 09/2012    admission with Heart block diagnosis   ??? Hyperlipidemia    ??? Knee joint pain     arthritis   ??? Migraine    ??? Obesity, unspecified    ??? Other second degree atrioventricular block    ??? Reflux esophagitis    ??? Sciatica 2014   ??? Shortness of breath    ??? Shoulder dislocation 1997       Past Surgical History:  Procedure Laterality Date   ??? HX GYN     ??? HX LEFT??SALPINGO-OOPHORECTOMY  2000    tubal pregnancy   ??? HX MENISCECTOMY Right 1990   ??? HX TUBAL LIGATION         Social History     Tobacco Use   ??? Smoking status: Former Smoker     Types: Cigars     Last attempt to quit: 09/03/2018   ??? Smokeless tobacco: Never Used   ??? Tobacco comment: "one black and mild per day when stressed"   Substance Use Topics   ??? Alcohol use: Yes     Alcohol/week: 1.0 standard drinks     Types: 1 Glasses of wine per week     Frequency: Monthly or less     Drinks per session: 1 or 2     Binge frequency: Never     Comment: rarely       No Known Allergies    Prior to Admission medications    Medication Sig Start Date End Date Taking? Authorizing  Provider   atorvastatin (LIPITOR) 20 mg tablet Take 1 Tab by mouth daily. For cholesterol 08/26/18  Yes Northern, [place], NP   ergocalciferol (ERGOCALCIFEROL) 50,000 unit capsule Take 1 Cap by mouth every seven (7) days. 08/26/18  Yes Northern, Maryanna Shape, NP   diclofenac-miSOPROStol (ARTHROTEC 50) 50-200 mg-mcg per tablet Take 1 Tab by mouth two (2) times daily as needed (knee pain). For pain 08/22/18  Yes [facility], Maryanna Shape, NP         Visit Vitals  BP 136/72   Pulse 85   Ht 5\' 2"  (1.575 m)   Wt 87.6 kg (193 lb 3.2 oz)   BMI 35.34 kg/m??         Physical Exam   Constitutional: She is oriented to person, place, and time. She appears well-developed and well-nourished.   HENT:   Head: Normocephalic and atraumatic.   Eyes: Conjunctivae are normal.   Neck: Neck supple. No JVD present. No tracheal deviation present. No thyromegaly present.   Cardiovascular: Normal rate and regular rhythm. PMI is not displaced. Exam reveals no gallop, no S3 and no decreased pulses.   No murmur heard.  Pulmonary/Chest: No respiratory distress. She has no wheezes. She has no rales. She exhibits no tenderness.   Abdominal: Soft. There is no tenderness.   Musculoskeletal: She exhibits no edema.   Neurological: She is alert and oriented to person, place, and time.   Skin: Skin is warm.   Psychiatric: She has a normal mood and affect.       Kimberly Lopez has a reminder for a "due or due soon" health maintenance. I have asked that she contact her primary care provider for follow-up on this health maintenance.    I Have personally reviewed recent relevant labs available and discussed with patient  I have personally reviewed patients ekg done at other facility.  I have personally reviewed patient's records available from hospital and other providers and incorporated findings in patient care.      Assessment         ICD-10-CM ICD-9-CM    1. Abnormal EKG R94.31 794.31 ECHO ADULT COMPLETE      NUCLEAR CARDIAC STRESS TEST      CARDIAC EVENT MONITOR    Long  first-degree heart block.   2. First degree heart block I44.0 426.11 ECHO ADULT COMPLETE      NUCLEAR CARDIAC STRESS TEST      CARDIAC  EVENT MONITOR    With prolonged.  Likely conduction system disorder.  Rule out sick sinus syndrome with complaint of palpitation   3. Hyperlipidemia, unspecified hyperlipidemia type E78.5 272.4     Severely elevated LDL now on statin   4. Type 2 diabetes mellitus without complication, without long-term current use of insulin (HCC) E11.9 250.00    5. SOB (shortness of breath) on exertion R06.02 786.05 ECHO ADULT COMPLETE      NUCLEAR CARDIAC STRESS TEST      CARDIAC EVENT MONITOR    Rule out arrhythmia, CHF, ischemia   6. 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 Arthritis M19.90 716.90     Severe bilateral knee pain difficulty ambulating       There are no discontinued medications.    No orders of the defined types were placed in this encounter.

## 2018-09-09 NOTE — Telephone Encounter (Signed)
Patient would like an rx for pain medicine called in her, she states the pain in her legs is getting worse.

## 2018-09-11 ENCOUNTER — Ambulatory Visit: Admit: 2018-09-11 | Payer: PRIVATE HEALTH INSURANCE | Primary: Family Medicine

## 2018-09-11 ENCOUNTER — Ambulatory Visit: Admit: 2018-09-11 | Payer: PRIVATE HEALTH INSURANCE | Attending: Nurse Practitioner | Primary: Family Medicine

## 2018-09-11 ENCOUNTER — Ambulatory Visit: Attending: Nurse Practitioner | Primary: Family Medicine

## 2018-09-11 ENCOUNTER — Ambulatory Visit

## 2018-09-11 DIAGNOSIS — M25561 Pain in right knee: Secondary | ICD-10-CM

## 2018-09-11 DIAGNOSIS — R9431 Abnormal electrocardiogram [ECG] [EKG]: Secondary | ICD-10-CM

## 2018-09-11 DIAGNOSIS — G8929 Other chronic pain: Secondary | ICD-10-CM

## 2018-09-11 LAB — TRANSTHORACIC ECHOCARDIOGRAM (TTE) COMPLETE (CONTRAST/BUBBLE/3D PRN)
AV Peak Gradient: 4.2 mmHg
AV Peak Velocity: 102.47 cm/s
AV Velocity Ratio: 0.81
Aortic Root: 2.79 cm
Ascending Aorta: 2.66 cm
E/E' Lateral: 6.9
E/E' Ratio (Averaged): 5.3
E/E' Septal: 3.7
Est. RA Pressure: 3 mmHg
Fractional Shortening 2D: 33.6697 %
IVSd: 1.12 cm — AB (ref 0.6–0.9)
LA Area 2C: 19.85 cm2
LA Area 4C: 19.4 cm2
LA Major Axis: 2.88 cm
LA Volume 2C: 58.25 mL — AB (ref 22–52)
LA Volume 4C: 52.84 mL — AB (ref 22–52)
LA Volume BP: 67.01 mL (ref 22–52)
LA Volume Index 2C: 30.94 ml/m2 (ref 16–28)
LA Volume Index 4C: 28.06 ml/m2 (ref 16–28)
LA Volume Index BP: 35.59 ml/m2 (ref 16–28)
LV E' Lateral Velocity: 5.98 cm/s
LV E' Septal Velocity: 11.15 cm/s
LV EDV Teich: 0.6287 mL
LV ESV Teich: 0.2358 mL
LV ESV Teich: 31.619 mL
LV Mass 2D Index: 120.2 g/m2 — AB (ref 43–95)
LV Mass 2D: 226.3 g — AB (ref 67–162)
LVIDd: 4.63 cm (ref 3.9–5.3)
LVIDs: 3.07 cm
LVOT Peak Gradient: 2.7 mmHg
LVOT Peak Velocity: 82.88 cm/s
LVPWd: 1.16 cm — AB (ref 0.6–0.9)
Left Ventricular Ejection Fraction: 63
MV "A" Wave Duration: 110.9 msec
MV A Velocity: 65.28 cm/s
MV Area by PHT: 3.4 cm2
MV E Velocity: 41.26 cm/s
MV E Wave Deceleration Time: 220.9 ms
MV E/A: 0.63
MV PHT: 64.1 ms
Mitral Valve E-F Slope by M-mode: 1.8676
PASP: 25 mmHg
PV Max Velocity: 98.11 cm/s
PV Peak Gradient: 3.8 mmHg
RA Area 4C: 12.6 cm2
RVIDd: 3.68 cm
TAPSE: 2.18 cm — AB (ref 1.5–2)
TR Max Velocity: 233.33 cm/s
TR Peak Gradient: 21.8 mmHg

## 2018-09-11 LAB — NM STRESS TEST WITH MYOCARDIAL PERFUSION
Baseline Diastolic BP: 70 mmHg
Baseline HR: 69 {beats}/min
Baseline Systolic BP: 136 mmHg
Left Ventricular Ejection Fraction: 52
Nuc Stress Diastolic Volume Index: 120 mL/m2
Nuc Stress Systolic Volume Index: 57 mL/m2
Recovery Stage 1 HR: 112 {beats}/min
Recovery Stage 2 HR: 97 {beats}/min
Recovery Stage 3 HR: 91 {beats}/min
Recovery Stage 4 HR: 71 {beats}/min
Stress Diastolic BP: 78 mmHg
Stress Peak HR: 137 {beats}/min
Stress Percent HR Achieved: 82 %
Stress Rate Pressure Product: 21920 bpm*mmHg
Stress ST Depression: 0 mm
Stress ST Elevation: 0 mm
Stress Stage 1 HR: 129 {beats}/min
Stress Systolic BP: 160 mmHg
Stress Target HR: 168 {beats}/min

## 2018-09-11 LAB — ECHO ADULT COMPLETE
AV Peak Gradient: 4.2 mmHg
AV Peak Velocity: 102.47 cm/s
AV Velocity Ratio: 0.81
Aortic Root: 2.79 cm
Ascending Aorta: 2.66 cm
E/E' Lateral: 6.9
E/E' Ratio (Averaged): 5.3
E/E' Septal: 3.7
Est. RA Pressure: 3 mmHg
IVSd: 1.12 cm — AB (ref 0.6–0.9)
LA Area 2C: 19.85 cm2
LA Area 4C: 19.4 cm2
LA Major Axis: 2.88 cm
LA Volume 2C: 58.25 mL — AB (ref 22–52)
LA Volume 4C: 52.84 mL — AB (ref 22–52)
LA Volume BP: 67.01 mL (ref 22–52)
LA Volume Index 2C: 30.94 ml/m2 (ref 16–28)
LA Volume Index 4C: 28.06 ml/m2 (ref 16–28)
LA Volume Index BP: 35.59 ml/m2 (ref 16–28)
LV E' Lateral Velocity: 5.98 cm/s
LV E' Septal Velocity: 11.15 cm/s
LV EDV Teich: 0.6287 mL
LV ESV Teich: 0.2358 mL
LV Mass 2D Index: 120.2 g/m2 — AB (ref 43–95)
LV Mass 2D: 226.3 g — AB (ref 67–162)
LVIDd: 4.63 cm (ref 3.9–5.3)
LVIDs: 3.07 cm
LVOT Peak Gradient: 2.7 mmHg
LVOT Peak Velocity: 82.88 cm/s
LVPWd: 1.16 cm — AB (ref 0.6–0.9)
Left Ventricular Fractional Shortening by 2D: 33.6697 %
Left Ventricular Stroke Volume by Teichholz Method: 31.619 mL
MV "A" Wave Duration: 110.9 msec
MV A Velocity: 65.28 cm/s
MV Area by PHT: 3.4 cm2
MV E Velocity: 41.26 cm/s
MV E Wave Deceleration Time: 220.9 ms
MV E/A: 0.63
MV PHT: 64.1 ms
Mitral Valve Deceleration Slope: 1.8676
PASP: 25 mmHg
PV Max Velocity: 98.11 cm/s
PV Peak Gradient: 3.8 mmHg
RA Area 4C: 12.6 cm2
RVIDd: 3.68 cm
TAPSE: 2.18 cm — AB (ref 1.5–2.0)
TR Max Velocity: 233.33 cm/s
TR Peak Gradient: 21.8 mmHg

## 2018-09-11 LAB — NUCLEAR CARDIAC STRESS TEST
Baseline Diastolic BP: 70 mmHg
Baseline HR: 69 {beats}/min
Baseline Systolic BP: 136 mmHg
Nuc Stress Diastolic Volume Index: 120 mL/m2
Nuc Stress Systolic Volume Index: 57 mL/m2
Recovery Stage 1 HR: 112 {beats}/min
Recovery Stage 2 HR: 97 {beats}/min
Recovery Stage 3 HR: 91 {beats}/min
Recovery Stage 4 HR: 71 {beats}/min
Stress Diastolic BP: 78 mmHg
Stress Peak HR: 137 {beats}/min
Stress Percent HR Achieved: 82 %
Stress Rate Pressure Product: 21920 bpm*mmHg
Stress ST Depression: 0 mm
Stress ST Elevation: 0 mm
Stress Stage 1 HR: 129 {beats}/min
Stress Systolic BP: 160 mmHg
Stress Target HR: 168 {beats}/min

## 2018-09-11 MED ORDER — REGADENOSON 0.4 MG/5 ML IV SYRINGE
0.4 mg/5 mL | Freq: Once | INTRAVENOUS | Status: AC
Start: 2018-09-11 — End: 2018-09-11

## 2018-09-11 MED ORDER — TECHNETIUM TC-99M TETROFOSMIN 1.38 MG IV SOLUTION KIT
1.38 mg | Freq: Once | INTRAVENOUS | Status: DC
Start: 2018-09-11 — End: 2018-09-11

## 2018-09-11 MED ORDER — MELOXICAM 7.5 MG TAB
7.5 mg | ORAL_TABLET | ORAL | 0 refills | Status: AC
Start: 2018-09-11 — End: ?

## 2018-09-11 MED ORDER — TECHNETIUM TC-99M TETROFOSMIN 1.38 MG IV SOLUTION KIT
1.38 mg | Freq: Once | INTRAVENOUS | Status: AC
Start: 2018-09-11 — End: 2018-09-11

## 2018-09-11 MED ORDER — OMEPRAZOLE 20 MG CAP, DELAYED RELEASE
20 mg | ORAL_CAPSULE | Freq: Every day | ORAL | 0 refills | Status: AC | PRN
Start: 2018-09-11 — End: ?

## 2018-09-11 MED ORDER — AMINOPHYLLINE 500 MG/20 ML IV
500 mg/20 mL | Freq: Once | INTRAVENOUS | Status: AC
Start: 2018-09-11 — End: 2018-09-11
  Administered 2018-09-11: 13:00:00 via INTRAVENOUS

## 2018-09-11 NOTE — Progress Notes (Signed)
09/11/2018    Reason for visit   Chief Complaint   Patient presents with   ??? Knee Pain     bilateral knee pain level 10 of 10 x 1 month       Patient Kimberly Lopez 07/25/1966     Primary MD No primary care provider on file.      Subjective  Kimberly Lopez a 52 y.o. female who presents with a sick visit for    Chief Complaint   Patient presents with   ??? Knee Pain     bilateral knee pain level 10 of 10 x 1 month       Past Medical History:   Diagnosis Date   ??? Asthma    ??? Asymmetric septal hypertrophy (HCC)    ??? Chest pain, unspecified    ??? COPD (chronic obstructive pulmonary disease) (HCC) 08/2018    PFT revealed normal study   ??? GERD (gastroesophageal reflux disease)    ??? H/O vertigo 09/2012    admission with Heart block diagnosis   ??? Hyperlipidemia    ??? Knee joint pain     arthritis   ??? Migraine    ??? Obesity, unspecified    ??? Other second degree atrioventricular block    ??? Reflux esophagitis    ??? Sciatica 2014   ??? Shortness of breath    ??? Shoulder dislocation 1997       Current Outpatient Medications on File Prior to Visit   Medication Sig Dispense Refill   ??? atorvastatin (LIPITOR) 20 mg tablet Take 1 Tab by mouth daily. For cholesterol 30 Tab 3   ??? ergocalciferol (ERGOCALCIFEROL) 50,000 unit capsule Take 1 Cap by mouth every seven (7) days. 4 Cap 3   ??? [DISCONTINUED] diclofenac-miSOPROStol (ARTHROTEC 50) 50-200 mg-mcg per tablet Take 1 Tab by mouth two (2) times daily as needed (knee pain). For pain 90 Tab 0     Current Facility-Administered Medications on File Prior to Visit   Medication Dose Route Frequency Provider Last Rate Last Dose   ??? regadenoson (LEXISCAN) injection 0.4 mg  0.4 mg IntraVENous ONCE Angeline Slim, MD       ??? technetium TC-99 tetrofosimin (TC-MYOVIEW) 1.38 mg injection 12.1 millicurie  12.1 millicurie IntraVENous ONCE Angeline Slim, MD       ??? technetium TC-99 tetrofosimin (TC-MYOVIEW) 1.38 mg injection 25-40 millicurie  25-40 millicurie IntraVENous ONCE Angeline Slim, MD        ??? [COMPLETED] aminophylline 500 mg/20 mL injection 100 mg  100 mg IntraVENous ONCE Angeline Slim, MD   100 mg at 09/11/18 0908   ??? [DISCONTINUED] technetium TC-99 tetrofosimin (TC-MYOVIEW) 1.38 mg injection 12 millicurie  12 millicurie IntraVENous ONCE Angeline Slim, MD       ??? [DISCONTINUED] technetium TC-99 tetrofosimin (TC-MYOVIEW) 1.38 mg injection 25-40 millicurie  25-40 millicurie IntraVENous ONCE Angeline Slim, MD       ??? [DISCONTINUED] technetium TC-99 tetrofosimin (TC-MYOVIEW) 1.38 mg injection 12.1 millicurie  12.1 millicurie IntraVENous ONCE Angeline Slim, MD       ??? [DISCONTINUED] technetium TC-99 tetrofosimin (TC-MYOVIEW) 1.38 mg injection 25-40 millicurie  25-40 millicurie IntraVENous ONCE Angeline Slim, MD           Pt walks in today with a waddling gait for a sick appt. pts main complaint is bilateral knee swelling and pain 10/10. Pt states she had a cortisone injection in her right knee 20 yrs ago. Pain has been present  for over a month despite taking Arthrotec. Describes pain as a constant ache with shooting pains running up thigh from the knee. Home therapies include ice but stopped because she felt her knee was going numb.      Objective    Visit Vitals  BP 110/67 (BP 1 Location: Left arm, BP Patient Position: Sitting)   Pulse 90   Temp 98.6 ??F (37 ??C) (Oral)   Resp 19   Ht 5\' 2"  (1.575 m)   Wt 187 lb 9.6 oz (85.1 kg)   SpO2 99%   BMI 34.31 kg/m??       Physical Exam   Constitutional: She is oriented to person, place, and time. She appears well-developed and well-nourished.   Morbidly Obese   Cardiovascular: Normal rate, regular rhythm and normal heart sounds.   Pulmonary/Chest: Effort normal and breath sounds normal.   Musculoskeletal:        Right knee: She exhibits decreased range of motion. She exhibits no swelling, no deformity and no erythema. No tenderness found.        Left knee: She exhibits decreased range of motion. She exhibits no  swelling, no erythema and no bony tenderness.   Neurological: She is alert and oriented to person, place, and time.   Psychiatric:   Patient appeared without stress. Able to hold full conversation.        Assessment  [name]    1. Chronic pain of both knees  Instructed patient to discontinue Arthrotec.     Instructed pt on therapeutic interventions to try at home. Apply ice/heat 3 times a day to help relieve pain; elevate extremity if swelling noted. Do not take any NSAIDs such as Motrin, Ibuprofen, Arthrotec, Aleve or BC powder while taking this NSAID. Instructed patient to take omeprazole once daily while taking meloxicam. Patient verbalized understanding.    Informed pt to address chronic knee pain with new provider as she may need ortho intervention.     - REFERRAL TO PRIMARY CARE  - omeprazole (PRILOSEC) 20 mg capsule; Take 1 Cap by mouth daily as needed (reflux. Take with NSAIDs).  Dispense: 30 Cap; Refill: 0  - meloxicam (MOBIC) 7.5 mg tablet; Take 1 tab daily as needed for knee pain. May take an additional 1 tab in the evening if needed for pain.  Dispense: 60 Tab; Refill: 0      Current Outpatient Medications on File Prior to Visit   Medication Sig Dispense Refill   ??? atorvastatin (LIPITOR) 20 mg tablet Take 1 Tab by mouth daily. For cholesterol 30 Tab 3   ??? ergocalciferol (ERGOCALCIFEROL) 50,000 unit capsule Take 1 Cap by mouth every seven (7) days. 4 Cap 3   ??? [DISCONTINUED] diclofenac-miSOPROStol (ARTHROTEC 50) 50-200 mg-mcg per tablet Take 1 Tab by mouth two (2) times daily as needed (knee pain). For pain 90 Tab 0     Current Facility-Administered Medications on File Prior to Visit   Medication Dose Route Frequency Provider Last Rate Last Dose   ??? regadenoson (LEXISCAN) injection 0.4 mg  0.4 mg IntraVENous ONCE Angeline Slim, MD       ??? technetium TC-99 tetrofosimin (TC-MYOVIEW) 1.38 mg injection 12.1 millicurie  12.1 millicurie IntraVENous ONCE Angeline Slim, MD        ??? technetium TC-99 tetrofosimin (TC-MYOVIEW) 1.38 mg injection 25-40 millicurie  25-40 millicurie IntraVENous ONCE Angeline Slim, MD       ??? [COMPLETED] aminophylline 500 mg/20 mL injection 100 mg  100  mg IntraVENous Otila Back, MD   100 mg at 09/11/18 0908   ??? [DISCONTINUED] technetium TC-99 tetrofosimin (TC-MYOVIEW) 1.38 mg injection 12 millicurie  12 millicurie IntraVENous ONCE Angeline Slim, MD       ??? [DISCONTINUED] technetium TC-99 tetrofosimin (TC-MYOVIEW) 1.38 mg injection 25-40 millicurie  25-40 millicurie IntraVENous ONCE Angeline Slim, MD       ??? [DISCONTINUED] technetium TC-99 tetrofosimin (TC-MYOVIEW) 1.38 mg injection 12.1 millicurie  12.1 millicurie IntraVENous ONCE Angeline Slim, MD       ??? [DISCONTINUED] technetium TC-99 tetrofosimin (TC-MYOVIEW) 1.38 mg injection 25-40 millicurie  25-40 millicurie IntraVENous ONCE Angeline Slim, MD             Due to pt having medical insurance, pt will not need a follow up appt. FSR, Amy, will assist pt to obtaining a new provider.       Maryanna Shape. Sreekar Broyhill, MSN, [facility], DNP  [facility]      I spent 20 minutes with the patient in face-to-face consultation, of which greater than 50% was spent in counseling and coordination of care as described above.

## 2018-09-11 NOTE — Patient Instructions (Signed)
As of July 1st 2019, If you have MEDICAID insurance you can NO LONGER come to the [facility].      You have the option to choose who you want to see for your medical health as long as they are accepting new patients and take your insurance. You may also use the provider that is located on your card.    The closet provider office to [facility] is [facility]. This office is in the [facility] next to [facility]. If you chose to go with [facility], please stop at our front desk and the receptionist will schedule you a 3 month appointment.     *[facility] - [address] [place], [place] [address]              Phone: [phone]        If you decide not to go with [facility], please locate a new provider in the next 1-2 weeks and call our office with the providers name and appointment date. This will allow our office to gather your medical information to transfer to your new provider to help reduce disconnect with your care.    If you have MEDICARE insurance, [facility] is located at [address] [place], [place] [address] Phone: [phone].        Please don't wait until the last minute to locate your new provider. Remember, you are responsible for your care, so please follow up with your new provider as scheduled.      Take care,  Dr. Essie Christine        FLU VACCINE:  The flu vaccine is available on 09/07/18 between 8am and 11 am at no cost to you by way of the flu vaccine drive thru at [facility] and [facility].       Knee Pain or Injury: Care Instructions  Your Care Instructions    Injuries are a common cause of knee problems. Sudden (acute) injuries may be caused by a direct blow to the knee. They can also be caused by abnormal twisting, bending, or falling on the knee. Pain, bruising, or swelling may be severe, and may start within minutes of the injury.   Overuse is another cause of knee pain. Other causes are climbing stairs, kneeling, and other activities that use the knee. Everyday wear and tear, especially as you get older, also can cause knee pain.  Rest, along with home treatment, often relieves pain and allows your knee to heal. If you have a serious knee injury, you may need tests and treatment.  Follow-up care is a key part of your treatment and safety. Be sure to make and go to all appointments, and call your doctor if you are having problems. It's also a good idea to know your test results and keep a list of the medicines you take.  How can you care for yourself at home?  ?? Be safe with medicines. Read and follow all instructions on the label.  ? If the doctor gave you a prescription medicine for pain, take it as prescribed.  ? If you are not taking a prescription pain medicine, ask your doctor if you can take an over-the-counter medicine.  ?? Rest and protect your knee. Take a break from any activity that may cause pain.  ?? Put ice or a cold pack on your knee for 10 to 20 minutes at a time. Put a thin cloth between the ice  and your skin.  ?? Prop up a sore knee on a pillow when you ice it or anytime you sit or lie down for the next 3 days. Try to keep it above the level of your heart. This will help reduce swelling.  ?? If your knee is not swollen, you can put moist heat, a heating pad, or a warm cloth on your knee.  ?? If your doctor recommends an elastic bandage, sleeve, or other type of support for your knee, wear it as directed.  ?? Follow your doctor's instructions about how much weight you can put on your leg. Use a cane, crutches, or a walker as instructed.  ?? Follow your doctor's instructions about activity during your healing process. If you can do mild exercise, slowly increase your activity.  ?? Reach and stay at a healthy weight. Extra weight can strain the joints, especially the knees and hips, and make the pain worse. Losing even a few  pounds may help.  When should you call for help?  Call 911 anytime you think you may need emergency care. For example, call if:  ?? ?? You have symptoms of a blood clot in your lung (called a pulmonary embolism). These may include:  ? Sudden chest pain.  ? Trouble breathing.  ? Coughing up blood.   ??Call your doctor now or seek immediate medical care if:  ?? ?? You have severe or increasing pain.   ?? ?? Your leg or foot turns cold or changes color.   ?? ?? You cannot stand or put weight on your knee.   ?? ?? Your knee looks twisted or bent out of shape.   ?? ?? You cannot move your knee.   ?? ?? You have signs of infection, such as:  ? Increased pain, swelling, warmth, or redness.  ? Red streaks leading from the knee.  ? Pus draining from a place on your knee.  ? A fever.   ?? ?? You have signs of a blood clot in your leg (called a deep vein thrombosis), such as:  ? Pain in your calf, back of the knee, thigh, or groin.  ? Redness and swelling in your leg or groin.   ??Watch closely for changes in your health, and be sure to contact your doctor if:  ?? ?? You have tingling, weakness, or numbness in your knee.   ?? ?? You have any new symptoms, such as swelling.   ?? ?? You have bruises from a knee injury that last longer than 2 weeks.   ?? ?? You do not get better as expected.   Where can you learn more?  Go to [employer].  Enter K195 in the search box to learn more about "Knee Pain or Injury: Care Instructions."  Current as of: May 15, 2018  Content Version: 12.2  ?? 2006-2019 [employer]. Care instructions adapted under license by [employer] (which disclaims liability or warranty for this information). If you have questions about a medical condition or this instruction, always ask your healthcare professional. [employer] disclaims any warranty or liability for your use of this information.

## 2018-09-11 NOTE — Telephone Encounter (Signed)
Patient came in for scheduled sick visit today and was evaluated and treated.

## 2018-09-11 NOTE — Progress Notes (Signed)
09/11/2018    Reason for visit   Chief Complaint   Patient presents with   ??? Knee Pain     bilateral knee pain level 10 of 10 x 1 month       Patient Kimberly Lopez 1966-11-01     Primary MD No primary care provider on file.      Subjective  Kimberly Lopez a 52 y.o. female who presents with a sick visit for    Chief Complaint   Patient presents with   ??? Knee Pain     bilateral knee pain level 10 of 10 x 1 month       Past Medical History:   Diagnosis Date   ??? Asthma    ??? Asymmetric septal hypertrophy (HCC)    ??? Chest pain, unspecified    ??? COPD (chronic obstructive pulmonary disease) (HCC) 08/2018    PFT revealed normal study   ??? GERD (gastroesophageal reflux disease)    ??? H/O vertigo 09/2012    admission with Heart block diagnosis   ??? Hyperlipidemia    ??? Knee joint pain     arthritis   ??? Migraine    ??? Obesity, unspecified    ??? Other second degree atrioventricular block    ??? Reflux esophagitis    ??? Sciatica 2014   ??? Shortness of breath    ??? Shoulder dislocation 1997       Current Outpatient Medications on File Prior to Visit   Medication Sig Dispense Refill   ??? atorvastatin (LIPITOR) 20 mg tablet Take 1 Tab by mouth daily. For cholesterol 30 Tab 3   ??? ergocalciferol (ERGOCALCIFEROL) 50,000 unit capsule Take 1 Cap by mouth every seven (7) days. 4 Cap 3   ??? [DISCONTINUED] diclofenac-miSOPROStol (ARTHROTEC 50) 50-200 mg-mcg per tablet Take 1 Tab by mouth two (2) times daily as needed (knee pain). For pain 90 Tab 0     Current Facility-Administered Medications on File Prior to Visit   Medication Dose Route Frequency Provider Last Rate Last Dose   ??? regadenoson (LEXISCAN) injection 0.4 mg  0.4 mg IntraVENous ONCE Angeline Slim, MD       ??? technetium TC-99 tetrofosimin (TC-MYOVIEW) 1.38 mg injection 12.1 millicurie  12.1 millicurie IntraVENous ONCE Angeline Slim, MD       ??? technetium TC-99 tetrofosimin (TC-MYOVIEW) 1.38 mg injection 25-40 millicurie  25-40 millicurie IntraVENous ONCE Angeline Slim, MD       ???  [COMPLETED] aminophylline 500 mg/20 mL injection 100 mg  100 mg IntraVENous ONCE Angeline Slim, MD   100 mg at 09/11/18 0908   ??? [DISCONTINUED] technetium TC-99 tetrofosimin (TC-MYOVIEW) 1.38 mg injection 12 millicurie  12 millicurie IntraVENous ONCE Angeline Slim, MD       ??? [DISCONTINUED] technetium TC-99 tetrofosimin (TC-MYOVIEW) 1.38 mg injection 25-40 millicurie  25-40 millicurie IntraVENous ONCE Angeline Slim, MD       ??? [DISCONTINUED] technetium TC-99 tetrofosimin (TC-MYOVIEW) 1.38 mg injection 12.1 millicurie  12.1 millicurie IntraVENous ONCE Angeline Slim, MD       ??? [DISCONTINUED] technetium TC-99 tetrofosimin (TC-MYOVIEW) 1.38 mg injection 25-40 millicurie  25-40 millicurie IntraVENous ONCE Angeline Slim, MD           Pt walks in today with a waddling gait for a sick appt. pts main complaint is bilateral knee swelling and pain 10/10. Pt states she had a cortisone injection in her right knee 20 yrs ago. Pain has been present  for over a month despite taking Arthrotec. Describes pain as a constant ache with shooting pains running up thigh from the knee. Home therapies include ice but stopped because she felt her knee was going numb.      Objective    Visit Vitals  BP 110/67 (BP 1 Location: Left arm, BP Patient Position: Sitting)   Pulse 90   Temp 98.6 ??F (37 ??C) (Oral)   Resp 19   Ht 5\' 2"  (1.575 m)   Wt 187 lb 9.6 oz (85.1 kg)   SpO2 99%   BMI 34.31 kg/m??       Physical Exam   Constitutional: She is oriented to person, place, and time. She appears well-developed and well-nourished.   Morbidly Obese   Cardiovascular: Normal rate, regular rhythm and normal heart sounds.   Pulmonary/Chest: Effort normal and breath sounds normal.   Musculoskeletal:        Right knee: She exhibits decreased range of motion. She exhibits no swelling, no deformity and no erythema. No tenderness found.        Left knee: She exhibits decreased range of motion. She exhibits no swelling, no erythema and no bony tenderness.    Neurological: She is alert and oriented to person, place, and time.   Psychiatric:   Patient appeared without stress. Able to hold full conversation.        Assessment  Kimberly Lopez    1. Chronic pain of both knees  Instructed patient to discontinue Arthrotec.     Instructed pt on therapeutic interventions to try at home. Apply ice/heat 3 times a day to help relieve pain; elevate extremity if swelling noted. Do not take any NSAIDs such as Motrin, Ibuprofen, Arthrotec, Aleve or BC powder while taking this NSAID. Instructed patient to take omeprazole once daily while taking meloxicam. Patient verbalized understanding.    Informed pt to address chronic knee pain with new provider as she may need ortho intervention.     - REFERRAL TO PRIMARY CARE  - omeprazole (PRILOSEC) 20 mg capsule; Take 1 Cap by mouth daily as needed (reflux. Take with NSAIDs).  Dispense: 30 Cap; Refill: 0  - meloxicam (MOBIC) 7.5 mg tablet; Take 1 tab daily as needed for knee pain. May take an additional 1 tab in the evening if needed for pain.  Dispense: 60 Tab; Refill: 0      Current Outpatient Medications on File Prior to Visit   Medication Sig Dispense Refill   ??? atorvastatin (LIPITOR) 20 mg tablet Take 1 Tab by mouth daily. For cholesterol 30 Tab 3   ??? ergocalciferol (ERGOCALCIFEROL) 50,000 unit capsule Take 1 Cap by mouth every seven (7) days. 4 Cap 3   ??? [DISCONTINUED] diclofenac-miSOPROStol (ARTHROTEC 50) 50-200 mg-mcg per tablet Take 1 Tab by mouth two (2) times daily as needed (knee pain). For pain 90 Tab 0     Current Facility-Administered Medications on File Prior to Visit   Medication Dose Route Frequency Provider Last Rate Last Dose   ??? regadenoson (LEXISCAN) injection 0.4 mg  0.4 mg IntraVENous ONCE Angeline Slim, MD       ??? technetium TC-99 tetrofosimin (TC-MYOVIEW) 1.38 mg injection 12.1 millicurie  12.1 millicurie IntraVENous ONCE Angeline Slim, MD       ??? technetium TC-99 tetrofosimin (TC-MYOVIEW) 1.38 mg injection  25-40 millicurie  25-40 millicurie IntraVENous ONCE Angeline Slim, MD       ??? [COMPLETED] aminophylline 500 mg/20 mL injection 100 mg  100  mg IntraVENous Otila Back, MD   100 mg at 09/11/18 0908   ??? [DISCONTINUED] technetium TC-99 tetrofosimin (TC-MYOVIEW) 1.38 mg injection 12 millicurie  12 millicurie IntraVENous ONCE Angeline Slim, MD       ??? [DISCONTINUED] technetium TC-99 tetrofosimin (TC-MYOVIEW) 1.38 mg injection 25-40 millicurie  25-40 millicurie IntraVENous ONCE Angeline Slim, MD       ??? [DISCONTINUED] technetium TC-99 tetrofosimin (TC-MYOVIEW) 1.38 mg injection 12.1 millicurie  12.1 millicurie IntraVENous ONCE Angeline Slim, MD       ??? [DISCONTINUED] technetium TC-99 tetrofosimin (TC-MYOVIEW) 1.38 mg injection 25-40 millicurie  25-40 millicurie IntraVENous ONCE Angeline Slim, MD             Due to pt having medical insurance, pt will not need a follow up appt. FSR, Amy, will assist pt to obtaining a new provider.       Kimberly Shape. Juniper Snyders, MSN, [facility], DNP  [facility]      I spent 20 minutes with the patient in face-to-face consultation, of which greater than 50% was spent in counseling and coordination of care as described above.

## 2018-09-12 ENCOUNTER — Encounter

## 2018-10-02 ENCOUNTER — Encounter: Primary: Family Medicine

## 2018-10-03 ENCOUNTER — Encounter

## 2018-10-03 ENCOUNTER — Inpatient Hospital Stay: Admit: 2018-10-03 | Payer: MEDICAID | Primary: Family Medicine

## 2018-10-03 DIAGNOSIS — M25569 Pain in unspecified knee: Secondary | ICD-10-CM

## 2018-10-23 ENCOUNTER — Encounter: Admit: 2018-10-23 | Discharge: 2018-10-23 | Payer: PRIVATE HEALTH INSURANCE | Primary: Family Medicine

## 2018-10-23 ENCOUNTER — Encounter

## 2018-10-23 ENCOUNTER — Encounter: Attending: Specialist | Primary: Family Medicine

## 2018-10-23 ENCOUNTER — Other Ambulatory Visit: Payer: Self-pay

## 2018-10-23 ENCOUNTER — Encounter (HOSPITAL_COMMUNITY): Payer: Self-pay | Admitting: Emergency Medicine

## 2018-10-23 ENCOUNTER — Emergency Department (HOSPITAL_COMMUNITY): Payer: Self-pay

## 2018-10-23 ENCOUNTER — Emergency Department (HOSPITAL_COMMUNITY)
Admission: EM | Admit: 2018-10-23 | Discharge: 2018-10-23 | Disposition: A | Discharge status: Home / Self Care | Payer: Self-pay | Attending: Emergency Medicine | Admitting: Emergency Medicine

## 2018-10-23 DIAGNOSIS — Z87891 Personal history of nicotine dependence: Secondary | ICD-10-CM | POA: Insufficient documentation

## 2018-10-23 DIAGNOSIS — Z79899 Other long term (current) drug therapy: Secondary | ICD-10-CM | POA: Insufficient documentation

## 2018-10-23 DIAGNOSIS — M549 Dorsalgia, unspecified: Secondary | ICD-10-CM | POA: Insufficient documentation

## 2018-10-23 NOTE — ED Triage Notes (Signed)
Pt arrives to ED from work  with complaints of left sided sharp chest pain that radiates to her back starting this morning. EMS reports pt received nitro x1 upon arrival. Pt stated she has a cardiologist and just finished a month of recording her heart rhythms. . Pt placed in position of comfort with bed locked and lowered, call bell in reach.
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
## 2018-10-23 NOTE — ED Notes (Signed)
Signature pad not available. Patient verbalized understanding of discharge instructions.   

## 2018-10-23 NOTE — Discharge Instructions (Addendum)
Take ibuprofen and tylenol for the pain ° °

## 2018-11-26 ENCOUNTER — Encounter: Attending: Nurse Practitioner | Primary: Family Medicine

## 2018-12-03 ENCOUNTER — Encounter: Attending: Nurse Practitioner | Primary: Family Medicine

## 2019-01-16 ENCOUNTER — Encounter (HOSPITAL_COMMUNITY): Payer: Self-pay | Admitting: Emergency Medicine

## 2019-01-16 ENCOUNTER — Emergency Department (HOSPITAL_COMMUNITY)
Admission: EM | Admit: 2019-01-16 | Discharge: 2019-01-16 | Disposition: A | Discharge status: Home / Self Care | Payer: Self-pay | Attending: Emergency Medicine | Admitting: Emergency Medicine

## 2019-01-16 DIAGNOSIS — Z87891 Personal history of nicotine dependence: Secondary | ICD-10-CM | POA: Insufficient documentation

## 2019-01-16 DIAGNOSIS — M25562 Pain in left knee: Secondary | ICD-10-CM

## 2019-01-16 DIAGNOSIS — Z79899 Other long term (current) drug therapy: Secondary | ICD-10-CM | POA: Insufficient documentation

## 2019-01-16 DIAGNOSIS — G8929 Other chronic pain: Secondary | ICD-10-CM | POA: Insufficient documentation

## 2019-01-16 HISTORY — DX: Unspecified osteoarthritis, unspecified site: M19.90

## 2019-01-16 MED ORDER — DICLOFENAC SODIUM 1 % TD GEL: 2.0000 g | Freq: Every day | TRANSDERMAL | 0 refills | Status: DC | PRN

## 2019-01-16 MED ORDER — KETOROLAC TROMETHAMINE 30 MG/ML IJ SOLN
30.0000 mg | Freq: Once | INTRAMUSCULAR | Status: AC
  Administered 2019-01-16: 30 mg via INTRAMUSCULAR
  Filled 2019-01-16: qty 1

## 2019-01-16 NOTE — ED Triage Notes (Signed)
Pt states her left knee is swollen and very painful. Pt has gout and states it is flaring up. Denies any fall/injury
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
## 2019-01-16 NOTE — Discharge Instructions (Addendum)
Please read attached information. If you experience any new or worsening signs or symptoms please return to the emergency room for evaluation. Please follow-up with your primary care provider or specialist as discussed. Please use medication prescribed only as directed and discontinue taking if you have any concerning signs or symptoms.   °

## 2019-01-16 NOTE — ED Notes (Signed)
Declined W/C at D/C and was escorted to lobby by RN. 

## 2019-07-23 ENCOUNTER — Other Ambulatory Visit: Payer: Self-pay

## 2019-07-23 ENCOUNTER — Emergency Department (HOSPITAL_COMMUNITY)
Admission: EM | Admit: 2019-07-23 | Discharge: 2019-07-23 | Disposition: A | Discharge status: Home / Self Care | Payer: Self-pay | Attending: Emergency Medicine | Admitting: Emergency Medicine

## 2019-07-23 ENCOUNTER — Encounter (HOSPITAL_COMMUNITY): Payer: Self-pay | Admitting: Emergency Medicine

## 2019-07-23 DIAGNOSIS — M25562 Pain in left knee: Secondary | ICD-10-CM | POA: Insufficient documentation

## 2019-07-23 DIAGNOSIS — J45909 Unspecified asthma, uncomplicated: Secondary | ICD-10-CM | POA: Insufficient documentation

## 2019-07-23 DIAGNOSIS — M25561 Pain in right knee: Secondary | ICD-10-CM | POA: Insufficient documentation

## 2019-07-23 DIAGNOSIS — Z79899 Other long term (current) drug therapy: Secondary | ICD-10-CM | POA: Insufficient documentation

## 2019-07-23 DIAGNOSIS — G8929 Other chronic pain: Secondary | ICD-10-CM | POA: Insufficient documentation

## 2019-07-23 DIAGNOSIS — Z87891 Personal history of nicotine dependence: Secondary | ICD-10-CM | POA: Insufficient documentation

## 2019-07-23 MED ORDER — TRAMADOL HCL 50 MG PO TABS: 50.0000 mg | ORAL_TABLET | Freq: Four times a day (QID) | ORAL | 0 refills | Status: DC | PRN

## 2019-07-23 MED ORDER — OXYCODONE-ACETAMINOPHEN 5-325 MG PO TABS
1.0000 | ORAL_TABLET | Freq: Once | ORAL | Status: AC
  Administered 2019-07-23: 1 via ORAL
  Filled 2019-07-23: qty 1

## 2019-07-23 NOTE — Discharge Instructions (Addendum)
Please read attached information. If you experience any new or worsening signs or symptoms please return to the emergency room for evaluation. Please follow-up with your primary care provider or specialist as discussed. Please use medication prescribed only as directed and discontinue taking if you have any concerning signs or symptoms.   °
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
## 2019-07-23 NOTE — ED Triage Notes (Signed)
Pt reports chronic knee pain that has been worse pt reports hx of arthritis.

## 2019-09-07 ENCOUNTER — Other Ambulatory Visit: Payer: Self-pay

## 2019-09-07 ENCOUNTER — Encounter (HOSPITAL_COMMUNITY): Payer: Self-pay

## 2019-09-07 ENCOUNTER — Emergency Department (HOSPITAL_COMMUNITY): Payer: Self-pay

## 2019-09-07 ENCOUNTER — Emergency Department (HOSPITAL_COMMUNITY)
Admission: EM | Admit: 2019-09-07 | Discharge: 2019-09-07 | Disposition: A | Discharge status: Home / Self Care | Payer: Self-pay | Attending: Emergency Medicine | Admitting: Emergency Medicine

## 2019-09-07 DIAGNOSIS — Z87891 Personal history of nicotine dependence: Secondary | ICD-10-CM | POA: Insufficient documentation

## 2019-09-07 DIAGNOSIS — J40 Bronchitis, not specified as acute or chronic: Secondary | ICD-10-CM | POA: Insufficient documentation

## 2019-09-07 DIAGNOSIS — Z79899 Other long term (current) drug therapy: Secondary | ICD-10-CM | POA: Insufficient documentation

## 2019-09-07 MED ORDER — ALBUTEROL SULFATE HFA 108 (90 BASE) MCG/ACT IN AERS: 1.0000 | INHALATION_SPRAY | Freq: Four times a day (QID) | RESPIRATORY_TRACT | 1 refills | Status: DC | PRN

## 2019-09-07 MED ORDER — PREDNISONE 10 MG PO TABS: ORAL_TABLET | ORAL | 0 refills | Status: DC

## 2019-09-07 NOTE — ED Triage Notes (Signed)
Patient complains of 2 weeks of intermittent cough and congestion. States that the cough is worse at night and describes as nonproductive, non-smoker, no fever
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
## 2019-09-07 NOTE — Discharge Instructions (Addendum)
Return if any problems.

## 2019-10-19 IMAGING — MG DIGITAL DIAGNOSTIC BILATERAL MAMMOGRAM WITH TOMO AND CAD
6 of 10 series · 6 of 30 positions shown · non-contrast
Comparison: None.

CLINICAL DATA: 51-year-old female with intermittent, shooting right
nipple pain for approximately 2 months.

EXAM:
DIGITAL DIAGNOSTIC BILATERAL MAMMOGRAM WITH CAD AND TOMO
ULTRASOUND RIGHT BREAST

[R CC synth-2D]
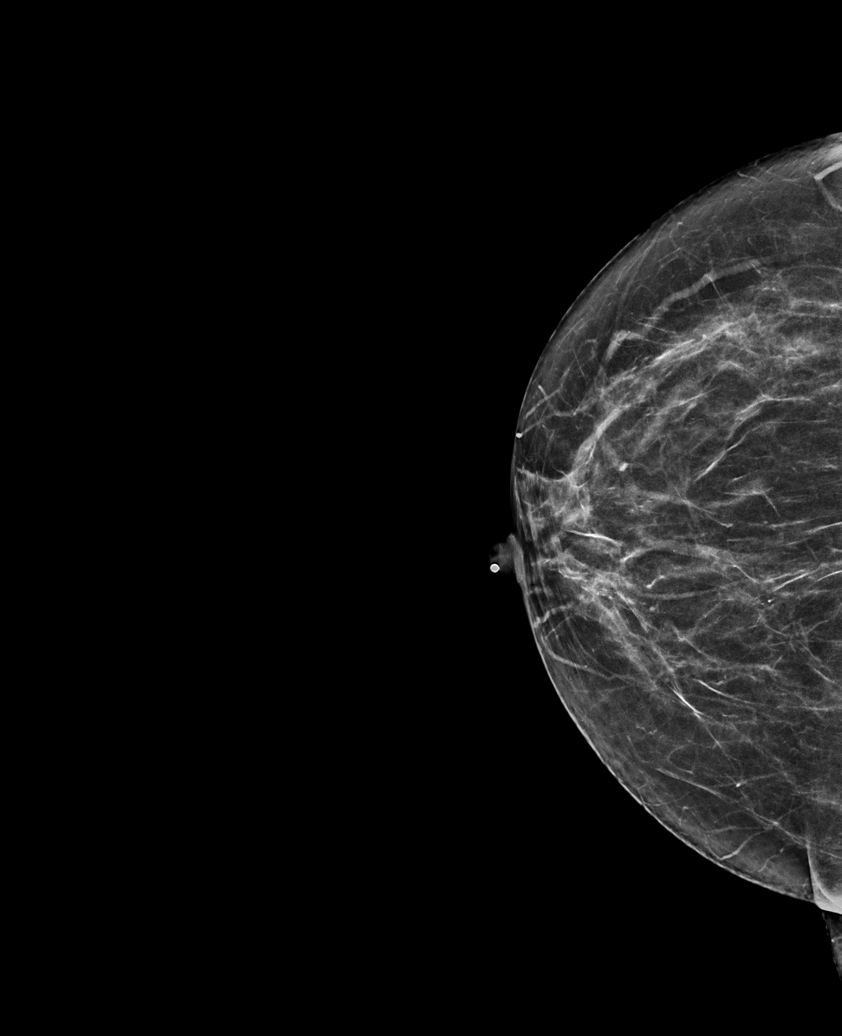

[R TAN synth-2D]
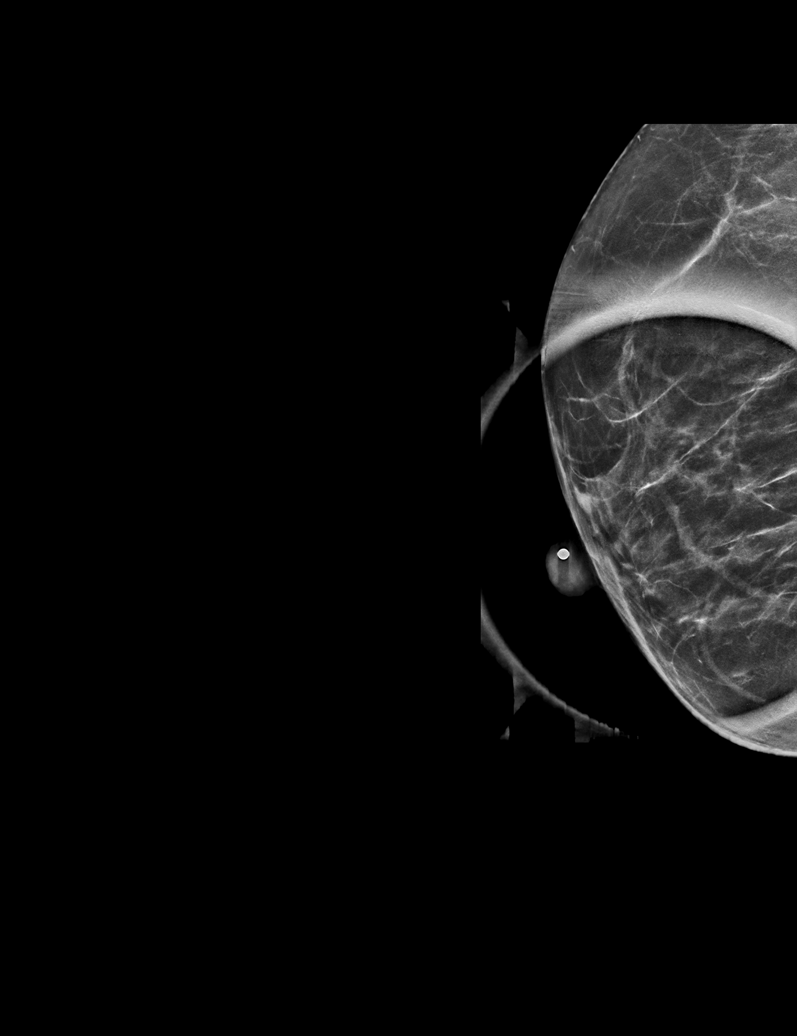

[L CC synth-2D]
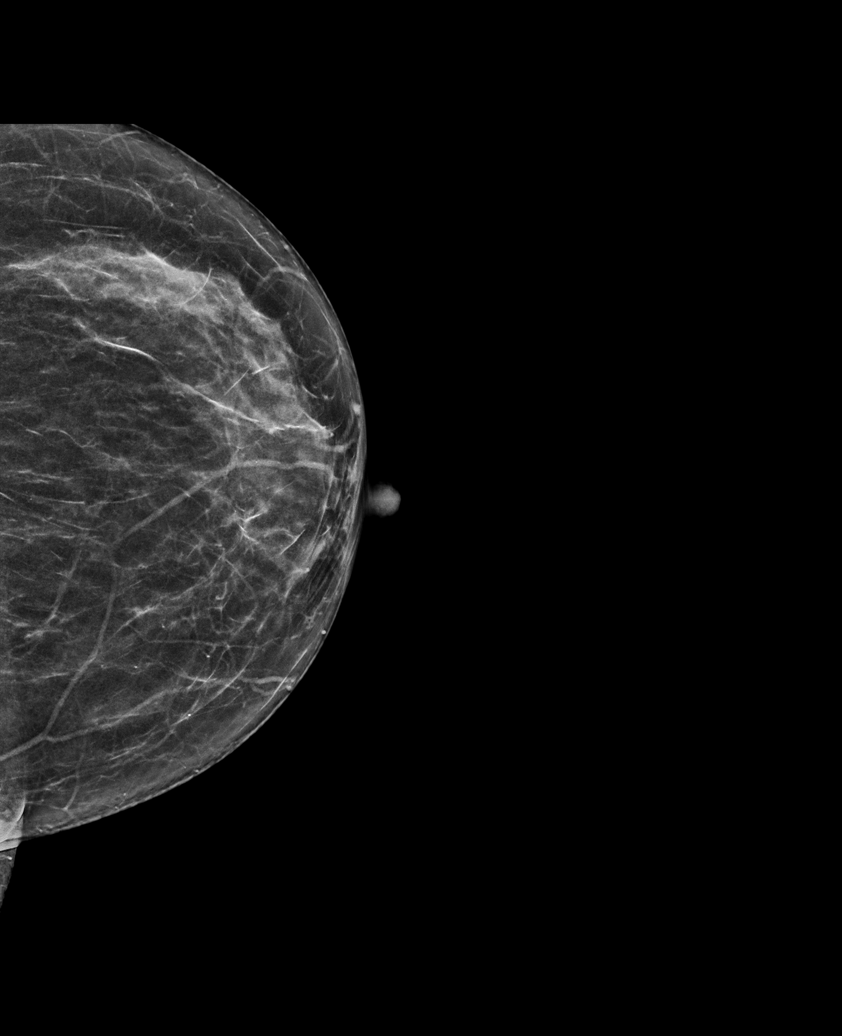

[R MLO synth-2D]
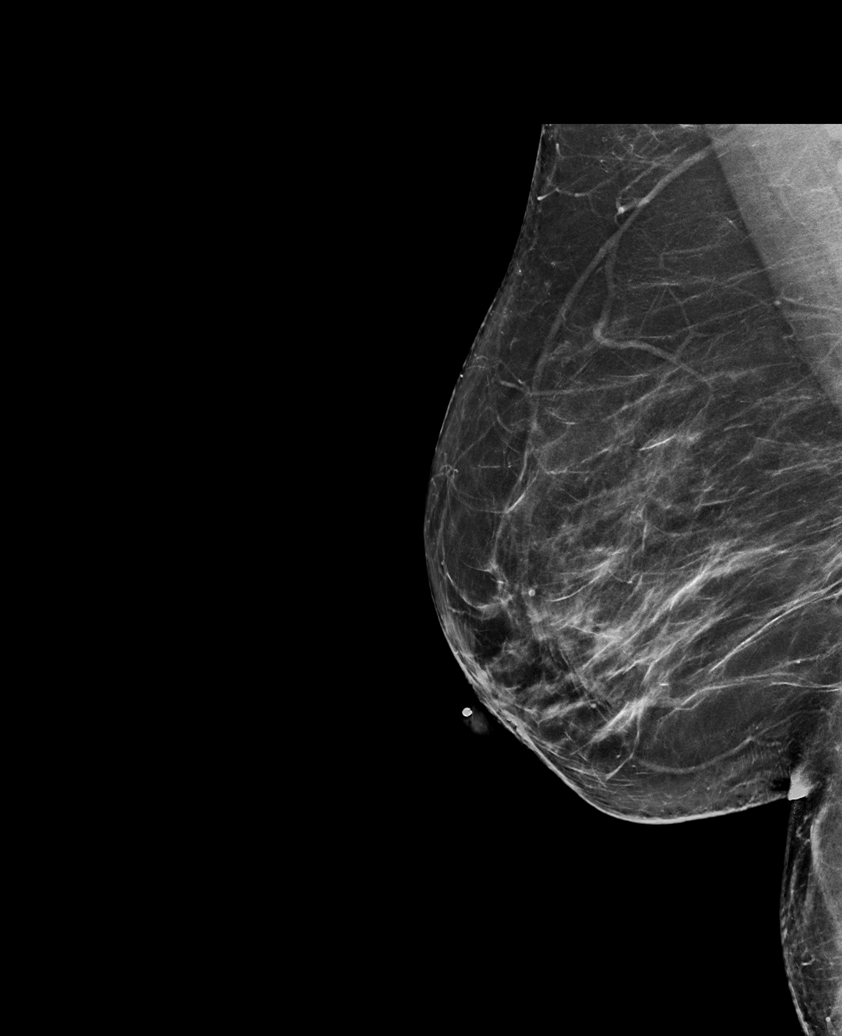

[L MLO synth-2D]
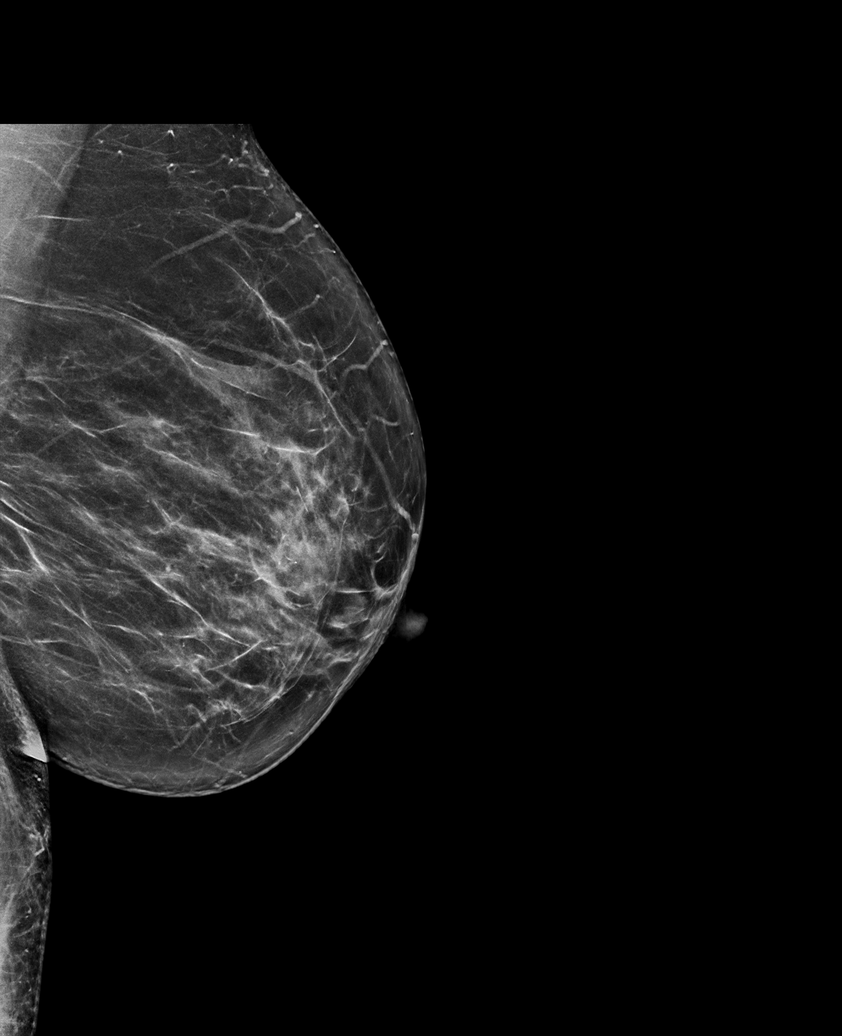

[R MLO tomo · tomo slice 41/82.0]
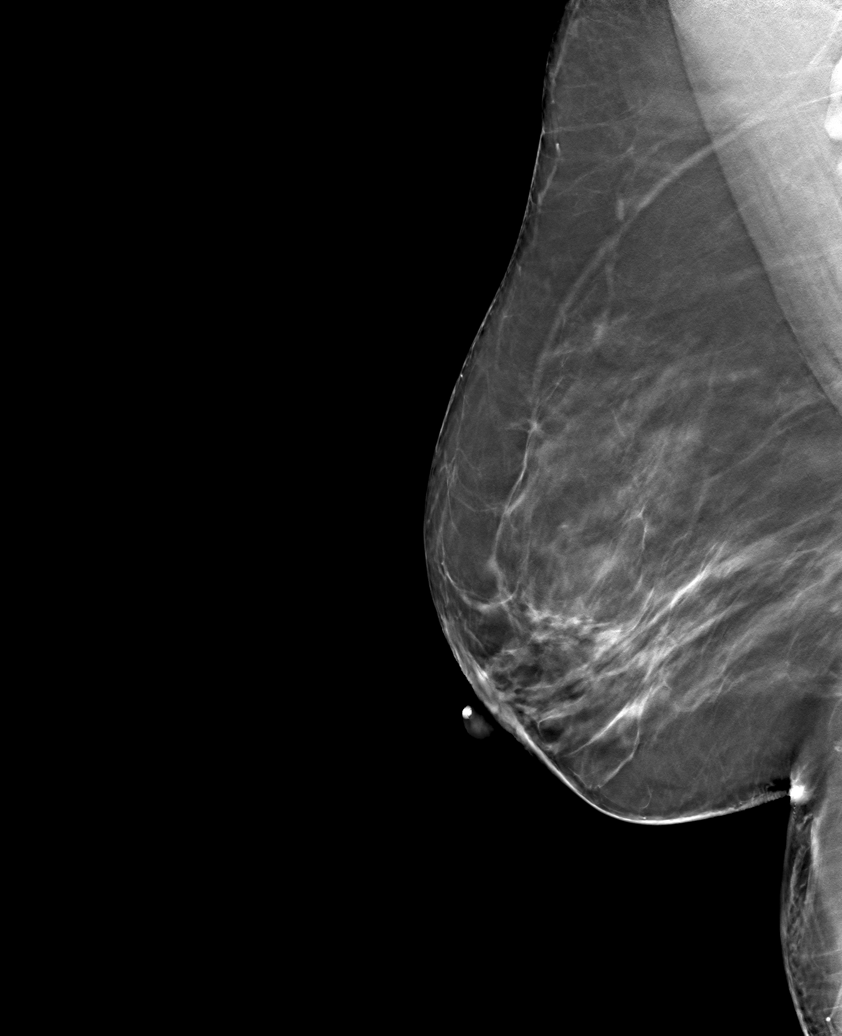

[6 of 30 positions shown; findings below may reference images not displayed]

ACR Breast Density Category c: The breast tissue is heterogeneously
dense, which may obscure small masses.
FINDINGS: No suspicious mammographic findings are identified in either breast.
Specifically, no mammographic abnormalities are noted in the right
subareolar region.

Mammographic images were processed with CAD.

Targeted ultrasound is performed, showing normal fibroglandular
tissue in the subareolar right breast without focal or suspicious
sonographic abnormality.
IMPRESSION: 1. No mammographic evidence of malignancy in either breast.
2. No additional sonographic findings to explain the patient's right
nipple pain.

RECOMMENDATION:
1. Clinical follow-up recommended for the painful area of concern in
the right breast. Any further workup should be based on clinical
grounds. Benign causes of breast pain, and possible remedies, were
discussed with the patient. Patient was encouraged to follow-up with
referring physician if pain became localized and persistent or if a
palpable lump/mass developed.
2.  Screening mammogram in one year.(Code:7K-5-BOX)

I have discussed the findings and recommendations with the patient.
Results were also provided in writing at the conclusion of the
visit. If applicable, a reminder letter will be sent to the patient
regarding the next appointment.

BI-RADS CATEGORY  1: Negative.

## 2019-10-22 ENCOUNTER — Other Ambulatory Visit: Payer: Self-pay

## 2019-10-22 ENCOUNTER — Emergency Department (HOSPITAL_COMMUNITY)
Admission: EM | Admit: 2019-10-22 | Discharge: 2019-10-23 | Disposition: A | Discharge status: Home / Self Care | Payer: Self-pay | Attending: Emergency Medicine | Admitting: Emergency Medicine

## 2019-10-22 ENCOUNTER — Encounter (HOSPITAL_COMMUNITY): Payer: Self-pay | Admitting: Emergency Medicine

## 2019-10-22 DIAGNOSIS — M25562 Pain in left knee: Secondary | ICD-10-CM | POA: Insufficient documentation

## 2019-10-22 DIAGNOSIS — Z87891 Personal history of nicotine dependence: Secondary | ICD-10-CM | POA: Insufficient documentation

## 2019-10-22 DIAGNOSIS — M25561 Pain in right knee: Secondary | ICD-10-CM | POA: Insufficient documentation

## 2019-10-22 DIAGNOSIS — G8929 Other chronic pain: Secondary | ICD-10-CM | POA: Insufficient documentation

## 2019-10-22 DIAGNOSIS — J45909 Unspecified asthma, uncomplicated: Secondary | ICD-10-CM | POA: Insufficient documentation

## 2019-10-22 MED ORDER — NAPROXEN 500 MG PO TABS: 500.0000 mg | ORAL_TABLET | Freq: Two times a day (BID) | ORAL | 0 refills | Status: DC

## 2019-10-22 MED ORDER — KETOROLAC TROMETHAMINE 15 MG/ML IJ SOLN
15.0000 mg | Freq: Once | INTRAMUSCULAR | Status: AC
  Administered 2019-10-22: 15 mg via INTRAMUSCULAR
  Filled 2019-10-22: qty 1

## 2019-10-22 NOTE — Discharge Instructions (Addendum)
You were evaluated in the Emergency Department and after careful evaluation, we did not find any emergent condition requiring admission or further testing in the hospital.  Your exam/testing today was overall reassuring.  Your symptoms seem to be due to arthritis.  As discussed, please take the Naprosyn anti-inflammatory medication as directed and follow-up with a primary care doctor or an orthopedic specialist.  Please return to the Emergency Department if you experience any worsening of your condition.  We encourage you to follow up with a primary care provider.  Thank you for allowing [place] to be a part of your care.   For health insurance needs, please contact [place] to apply for Medicaid. You can also apply online at [email or web address]  [facility] [address] [place] [address] [phone]

## 2019-10-22 NOTE — ED Triage Notes (Signed)
C/o bilateral upper leg pain "for years".  Relates pain to arthritis and states it is worse today.

## 2019-10-22 NOTE — Progress Notes (Signed)
CSW attempted to reach patient to assist her with resources for obtaining health insurance, however patient did not answer and her voicemail box is full.  CSW added information for obtaining insurance on patient's AVS.  Madilyn Fireman, MSW, LCSW-A Transitions of Care  Clinical Social Worker  [facility] Emergency Departments  Medical ICU [phone]

## 2019-10-22 NOTE — ED Notes (Signed)
Gave patient some warm blankets.
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
## 2019-11-07 ENCOUNTER — Ambulatory Visit: Payer: Self-pay | Admitting: Internal Medicine

## 2020-02-24 ENCOUNTER — Other Ambulatory Visit: Payer: Self-pay

## 2020-02-24 ENCOUNTER — Encounter (HOSPITAL_COMMUNITY): Payer: Self-pay

## 2020-02-24 ENCOUNTER — Emergency Department (HOSPITAL_COMMUNITY)
Admission: EM | Admit: 2020-02-24 | Discharge: 2020-02-24 | Disposition: A | Discharge status: Home / Self Care | Payer: Self-pay | Attending: Emergency Medicine | Admitting: Emergency Medicine

## 2020-02-24 DIAGNOSIS — M79605 Pain in left leg: Secondary | ICD-10-CM | POA: Insufficient documentation

## 2020-02-24 DIAGNOSIS — Z5321 Procedure and treatment not carried out due to patient leaving prior to being seen by health care provider: Secondary | ICD-10-CM | POA: Insufficient documentation

## 2020-02-24 NOTE — ED Triage Notes (Signed)
Pt here primarly for leg pain, L  Upper leg swollen and painful today, hx arthritis in both knees. Pain ongoing x2 weeks, calves equal, neither red nor swollen, +2 pulses bilaterally.

## 2020-02-26 ENCOUNTER — Emergency Department (HOSPITAL_COMMUNITY): Payer: Self-pay

## 2020-02-26 ENCOUNTER — Encounter (HOSPITAL_COMMUNITY): Payer: Self-pay | Admitting: Emergency Medicine

## 2020-02-26 ENCOUNTER — Other Ambulatory Visit: Payer: Self-pay

## 2020-02-26 ENCOUNTER — Emergency Department (HOSPITAL_COMMUNITY)
Admission: EM | Admit: 2020-02-26 | Discharge: 2020-02-26 | Disposition: A | Discharge status: Home / Self Care | Payer: Self-pay | Attending: Emergency Medicine | Admitting: Emergency Medicine

## 2020-02-26 DIAGNOSIS — M25562 Pain in left knee: Secondary | ICD-10-CM | POA: Insufficient documentation

## 2020-02-26 DIAGNOSIS — M1712 Unilateral primary osteoarthritis, left knee: Secondary | ICD-10-CM

## 2020-02-26 DIAGNOSIS — Z87891 Personal history of nicotine dependence: Secondary | ICD-10-CM | POA: Insufficient documentation

## 2020-02-26 DIAGNOSIS — J45909 Unspecified asthma, uncomplicated: Secondary | ICD-10-CM | POA: Insufficient documentation

## 2020-02-26 DIAGNOSIS — Z79899 Other long term (current) drug therapy: Secondary | ICD-10-CM | POA: Insufficient documentation

## 2020-02-26 MED ORDER — PREDNISONE 10 MG PO TABS: 60.0000 mg | ORAL_TABLET | Freq: Every day | ORAL | 0 refills | Status: AC
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
## 2020-02-26 NOTE — ED Triage Notes (Signed)
Pt here with co L. Upper leg pain and swelling. Pt seen in the ED 02/24/2020 for same complaint. Pt states this has been going on for 2weeks. Both calves are equal with no redness or swelling. Hx. Of arthritis in both knees.

## 2020-02-26 NOTE — ED Notes (Signed)
Patient Alert and oriented to baseline. Stable and ambulatory to baseline. Patient verbalized understanding of the discharge instructions.  Patient belongings were taken by the patient.   

## 2020-02-26 NOTE — Discharge Instructions (Signed)
Take prednisone as prescribed and complete the full course. Follow-up with orthopedics, call today to schedule an appointment. You may continue applying the Voltaren gel, taking Tylenol, wearing a compressive knee sleeve.

## 2020-02-26 NOTE — ED Notes (Signed)
Pt ambulatory to the restroom without difficulty. Gait steady and even.  

## 2020-08-12 ENCOUNTER — Emergency Department (HOSPITAL_COMMUNITY)
Admission: EM | Admit: 2020-08-12 | Discharge: 2020-08-12 | Disposition: A | Discharge status: Home / Self Care | Payer: Medicaid Other | Attending: Emergency Medicine | Admitting: Emergency Medicine

## 2020-08-12 ENCOUNTER — Other Ambulatory Visit: Payer: Self-pay

## 2020-08-12 ENCOUNTER — Encounter (HOSPITAL_COMMUNITY): Payer: Self-pay | Admitting: Emergency Medicine

## 2020-08-12 DIAGNOSIS — Z79899 Other long term (current) drug therapy: Secondary | ICD-10-CM | POA: Insufficient documentation

## 2020-08-12 DIAGNOSIS — M1712 Unilateral primary osteoarthritis, left knee: Secondary | ICD-10-CM | POA: Insufficient documentation

## 2020-08-12 DIAGNOSIS — J45909 Unspecified asthma, uncomplicated: Secondary | ICD-10-CM | POA: Insufficient documentation

## 2020-08-12 DIAGNOSIS — Z87891 Personal history of nicotine dependence: Secondary | ICD-10-CM | POA: Insufficient documentation

## 2020-08-12 DIAGNOSIS — M1990 Unspecified osteoarthritis, unspecified site: Secondary | ICD-10-CM

## 2020-08-12 DIAGNOSIS — M1711 Unilateral primary osteoarthritis, right knee: Secondary | ICD-10-CM | POA: Insufficient documentation

## 2020-08-12 MED ORDER — KETOROLAC TROMETHAMINE 30 MG/ML IJ SOLN
30.0000 mg | Freq: Once | INTRAMUSCULAR | Status: AC
  Administered 2020-08-12: 30 mg via INTRAMUSCULAR
  Filled 2020-08-12: qty 1

## 2020-08-12 MED ORDER — NAPROXEN 500 MG PO TABS: 500.0000 mg | ORAL_TABLET | Freq: Two times a day (BID) | ORAL | 0 refills | Status: DC

## 2020-08-12 MED ORDER — DICLOFENAC SODIUM 1 % EX GEL: 2.0000 g | Freq: Four times a day (QID) | CUTANEOUS | 0 refills | Status: DC

## 2020-08-12 NOTE — ED Triage Notes (Signed)
Pt reports arthritis flare up with bilateral knee and hip pain since this morning.  Denies injury.
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
## 2020-08-12 NOTE — Discharge Instructions (Signed)
Take the medications as needed.  Take naproxen beginning tomorrow. Apply ice and practice range of motion to prevent stiffness and worsening pain. Return to the ER for worsening pain, injuries or falls, redness, warmth of joint.

## 2020-11-10 ENCOUNTER — Emergency Department (HOSPITAL_COMMUNITY)
Admission: EM | Admit: 2020-11-10 | Discharge: 2020-11-11 | Disposition: A | Discharge status: Home / Self Care | Payer: Medicaid Other | Attending: Emergency Medicine | Admitting: Emergency Medicine

## 2020-11-10 ENCOUNTER — Encounter (HOSPITAL_COMMUNITY): Payer: Self-pay | Admitting: Emergency Medicine

## 2020-11-10 DIAGNOSIS — Z7951 Long term (current) use of inhaled steroids: Secondary | ICD-10-CM | POA: Insufficient documentation

## 2020-11-10 DIAGNOSIS — J45909 Unspecified asthma, uncomplicated: Secondary | ICD-10-CM | POA: Insufficient documentation

## 2020-11-10 DIAGNOSIS — R21 Rash and other nonspecific skin eruption: Secondary | ICD-10-CM | POA: Insufficient documentation

## 2020-11-10 DIAGNOSIS — R109 Unspecified abdominal pain: Secondary | ICD-10-CM | POA: Insufficient documentation

## 2020-11-10 DIAGNOSIS — Z87891 Personal history of nicotine dependence: Secondary | ICD-10-CM | POA: Insufficient documentation

## 2020-11-10 DIAGNOSIS — M545 Low back pain, unspecified: Secondary | ICD-10-CM | POA: Insufficient documentation

## 2020-11-10 LAB — URINALYSIS, ROUTINE W REFLEX MICROSCOPIC
Bilirubin Urine: NEGATIVE
Glucose, UA: NEGATIVE mg/dL
Hgb urine dipstick: NEGATIVE
Ketones, ur: 5 mg/dL — AB
Nitrite: NEGATIVE
Protein, ur: NEGATIVE mg/dL
Specific Gravity, Urine: 1.023 (ref 1.005–1.030)
pH: 5 (ref 5.0–8.0)

## 2020-11-10 LAB — CBC WITH DIFFERENTIAL/PLATELET
Abs Immature Granulocytes: 0.02 10*3/uL (ref 0.00–0.07)
Basophils Absolute: 0 10*3/uL (ref 0.0–0.1)
Basophils Relative: 0 %
Eosinophils Absolute: 0 10*3/uL (ref 0.0–0.5)
Eosinophils Relative: 0 %
HCT: 38.5 % (ref 36.0–46.0)
Hemoglobin: 13.3 g/dL (ref 12.0–15.0)
Immature Granulocytes: 0 %
Lymphocytes Relative: 47 %
Lymphs Abs: 3.8 10*3/uL (ref 0.7–4.0)
MCH: 31.7 pg (ref 26.0–34.0)
MCHC: 34.5 g/dL (ref 30.0–36.0)
MCV: 91.9 fL (ref 80.0–100.0)
Monocytes Absolute: 0.5 10*3/uL (ref 0.1–1.0)
Monocytes Relative: 6 %
Neutro Abs: 3.8 10*3/uL (ref 1.7–7.7)
Neutrophils Relative %: 47 %
Platelets: 246 10*3/uL (ref 150–400)
RBC: 4.19 MIL/uL (ref 3.87–5.11)
RDW: 13.5 % (ref 11.5–15.5)
WBC: 8.1 10*3/uL (ref 4.0–10.5)
nRBC: 0 % (ref 0.0–0.2)

## 2020-11-10 MED ORDER — IBUPROFEN 400 MG PO TABS
600.0000 mg | ORAL_TABLET | Freq: Once | ORAL | Status: AC
  Administered 2020-11-10: 600 mg via ORAL
  Filled 2020-11-10: qty 1

## 2020-11-10 MED ORDER — CYCLOBENZAPRINE HCL 10 MG PO TABS
10.0000 mg | ORAL_TABLET | Freq: Once | ORAL | Status: AC
  Administered 2020-11-10: 10 mg via ORAL
  Filled 2020-11-10: qty 1
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
## 2020-11-10 NOTE — ED Triage Notes (Signed)
Pt reports itchy rash to face and arms. Also endorses some Right lower back pain. Denies urinary symptoms or radiation of pain to her legs.

## 2020-11-11 LAB — BASIC METABOLIC PANEL
Anion gap: 9 (ref 5–15)
BUN: 10 mg/dL (ref 6–20)
CO2: 23 mmol/L (ref 22–32)
Calcium: 9.2 mg/dL (ref 8.9–10.3)
Chloride: 106 mmol/L (ref 98–111)
Creatinine, Ser: 0.66 mg/dL (ref 0.44–1.00)
GFR, Estimated: >60 mL/min (ref >60)
Glucose, Bld: 88 mg/dL (ref 70–99)
Potassium: 3.5 mmol/L (ref 3.5–5.1)
Sodium: 138 mmol/L (ref 135–145)

## 2020-11-11 MED ORDER — NAPROXEN 375 MG PO TABS: 375.0000 mg | ORAL_TABLET | Freq: Two times a day (BID) | ORAL | 0 refills | Status: DC

## 2020-11-11 MED ORDER — CYCLOBENZAPRINE HCL 10 MG PO TABS: 10.0000 mg | ORAL_TABLET | Freq: Two times a day (BID) | ORAL | 0 refills | Status: DC | PRN

## 2021-02-23 ENCOUNTER — Encounter (HOSPITAL_COMMUNITY): Payer: Self-pay

## 2021-02-23 ENCOUNTER — Emergency Department (HOSPITAL_COMMUNITY)
Admission: EM | Admit: 2021-02-23 | Discharge: 2021-02-23 | Disposition: A | Discharge status: Home / Self Care | Payer: Medicaid Other | Attending: Emergency Medicine | Admitting: Emergency Medicine

## 2021-02-23 ENCOUNTER — Other Ambulatory Visit: Payer: Self-pay

## 2021-02-23 DIAGNOSIS — Z87891 Personal history of nicotine dependence: Secondary | ICD-10-CM | POA: Insufficient documentation

## 2021-02-23 DIAGNOSIS — J45909 Unspecified asthma, uncomplicated: Secondary | ICD-10-CM | POA: Insufficient documentation

## 2021-02-23 DIAGNOSIS — M1990 Unspecified osteoarthritis, unspecified site: Secondary | ICD-10-CM | POA: Insufficient documentation

## 2021-02-23 DIAGNOSIS — K0889 Other specified disorders of teeth and supporting structures: Secondary | ICD-10-CM | POA: Insufficient documentation

## 2021-02-23 MED ORDER — KETOROLAC TROMETHAMINE 60 MG/2ML IM SOLN
60.0000 mg | Freq: Once | INTRAMUSCULAR | Status: AC
  Administered 2021-02-23: 60 mg via INTRAMUSCULAR
  Filled 2021-02-23: qty 2

## 2021-02-23 MED ORDER — AMOXICILLIN-POT CLAVULANATE 875-125 MG PO TABS: 1.0000 | ORAL_TABLET | Freq: Two times a day (BID) | ORAL | 0 refills | Status: DC

## 2021-02-23 NOTE — ED Triage Notes (Signed)
Pt c/o toothache and bilateral leg pain, states hx of arthritis but worsening at home. Endorses fever at home.

## 2021-02-23 NOTE — ED Notes (Signed)
Pt discharged from this ED in stable condition at this time. All discharge instructions and follow up care reviewed with pt with no further questions at this time. Pt ambulatory with steady gait, clear speech.  

## 2021-02-23 NOTE — Discharge Instructions (Addendum)
Please take the antibiotics, as prescribed.  I would also like for you to follow-up with the dental resources provided as well as [facility] and [facility].  Continue to take NSAIDs as needed for your arthritic pains.  Return to the ED or seek immediate medical attention should you experience any new or worsening symptoms.
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
## 2021-06-15 ENCOUNTER — Ambulatory Visit (HOSPITAL_COMMUNITY)
Admission: EM | Admit: 2021-06-15 | Discharge: 2021-06-15 | Disposition: A | Discharge status: Home / Self Care | Payer: 59 | Attending: Medical Oncology | Admitting: Medical Oncology

## 2021-06-15 ENCOUNTER — Encounter (HOSPITAL_COMMUNITY): Payer: Self-pay

## 2021-06-15 ENCOUNTER — Other Ambulatory Visit: Payer: Self-pay

## 2021-06-15 DIAGNOSIS — M25561 Pain in right knee: Secondary | ICD-10-CM | POA: Diagnosis not present

## 2021-06-15 DIAGNOSIS — M25562 Pain in left knee: Secondary | ICD-10-CM | POA: Diagnosis not present

## 2021-06-15 MED ORDER — KETOROLAC TROMETHAMINE 30 MG/ML IJ SOLN
30.0000 mg | Freq: Once | INTRAMUSCULAR | Status: AC
  Administered 2021-06-15: 30 mg via INTRAMUSCULAR

## 2021-06-15 MED ORDER — KETOROLAC TROMETHAMINE 30 MG/ML IJ SOLN
INTRAMUSCULAR | Status: AC
  Filled 2021-06-15: qty 1

## 2021-06-15 MED ORDER — MELOXICAM 7.5 MG PO TABS: 7.5000 mg | ORAL_TABLET | Freq: Every day | ORAL | 0 refills | Status: DC

## 2021-06-15 NOTE — ED Triage Notes (Signed)
Pt presents POV with c/o vomiting x 1 hour and left leg pain X 5 days.  States she has taeken OTC pain reducer that's given her no relief.
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
## 2021-08-06 ENCOUNTER — Encounter (HOSPITAL_COMMUNITY): Payer: Self-pay | Admitting: Emergency Medicine

## 2021-08-06 ENCOUNTER — Other Ambulatory Visit: Payer: Self-pay

## 2021-08-06 ENCOUNTER — Ambulatory Visit (HOSPITAL_COMMUNITY)
Admission: EM | Admit: 2021-08-06 | Discharge: 2021-08-06 | Disposition: A | Discharge status: Home / Self Care | Payer: 59 | Attending: Urgent Care | Admitting: Urgent Care

## 2021-08-06 ENCOUNTER — Ambulatory Visit (INDEPENDENT_AMBULATORY_CARE_PROVIDER_SITE_OTHER): Payer: 59

## 2021-08-06 DIAGNOSIS — S62306A Unspecified fracture of fifth metacarpal bone, right hand, initial encounter for closed fracture: Secondary | ICD-10-CM | POA: Diagnosis not present

## 2021-08-06 DIAGNOSIS — Z23 Encounter for immunization: Secondary | ICD-10-CM

## 2021-08-06 DIAGNOSIS — S62316A Displaced fracture of base of fifth metacarpal bone, right hand, initial encounter for closed fracture: Secondary | ICD-10-CM

## 2021-08-06 DIAGNOSIS — S61412A Laceration without foreign body of left hand, initial encounter: Secondary | ICD-10-CM

## 2021-08-06 DIAGNOSIS — M79641 Pain in right hand: Secondary | ICD-10-CM

## 2021-08-06 DIAGNOSIS — M7989 Other specified soft tissue disorders: Secondary | ICD-10-CM | POA: Diagnosis not present

## 2021-08-06 DIAGNOSIS — T148XXA Other injury of unspecified body region, initial encounter: Secondary | ICD-10-CM

## 2021-08-06 MED ORDER — KETOROLAC TROMETHAMINE 30 MG/ML IJ SOLN
INTRAMUSCULAR | Status: AC
  Filled 2021-08-06: qty 1

## 2021-08-06 MED ORDER — LIDOCAINE-EPINEPHRINE 1 %-1:100000 IJ SOLN
INTRAMUSCULAR | Status: AC
  Filled 2021-08-06: qty 1

## 2021-08-06 MED ORDER — KETOROLAC TROMETHAMINE 30 MG/ML IJ SOLN
30.0000 mg | Freq: Once | INTRAMUSCULAR | Status: AC
  Administered 2021-08-06: 30 mg via INTRAMUSCULAR

## 2021-08-06 MED ORDER — HYDROCODONE-ACETAMINOPHEN 5-325 MG PO TABS: 2.0000 | ORAL_TABLET | Freq: Four times a day (QID) | ORAL | 0 refills | Status: DC | PRN

## 2021-08-06 MED ORDER — TETANUS-DIPHTH-ACELL PERTUSSIS 5-2.5-18.5 LF-MCG/0.5 IM SUSY
0.5000 mL | PREFILLED_SYRINGE | Freq: Once | INTRAMUSCULAR | Status: AC
  Administered 2021-08-06: 0.5 mL via INTRAMUSCULAR

## 2021-08-06 MED ORDER — TETANUS-DIPHTH-ACELL PERTUSSIS 5-2.5-18.5 LF-MCG/0.5 IM SUSY
PREFILLED_SYRINGE | INTRAMUSCULAR | Status: AC
  Filled 2021-08-06: qty 0.5

## 2021-08-06 NOTE — Discharge Instructions (Addendum)
Please change your dressing 3-5 times daily. Do not apply any ointments or creams. Each time you change your dressing, make sure that you are pressing on the wound.  Pat your wound dry and let it air out if possible to make sure it is dry before reapplying another dressing.  If you develop any drainage of pus or bleeding, please come back to the clinic for recheck of your wound.  Do not use any nonsteroidal anti-inflammatories (NSAIDs) like ibuprofen, Motrin, naproxen, Aleve, etc. which are all available over-the-counter.  Please just use Tylenol at a dose of 500mg -650mg  once every 6 hours as needed for your aches, pains, fevers.  You can also use hydrocodone for breakthrough pain, wrist pain that is not controlled by just the use of Tylenol.  Make sure you wear the splint at all times and follow-up with Dr. , a hand specialist, early next week.  He can also use ibuprofen at a dose of 600 mg once every 8 hours for pain and inflammation of the hand.

## 2021-08-06 NOTE — Progress Notes (Signed)
Orthopedic Tech Progress Note Patient Details:  Ferne Ellingwood Mar 19, 1966 [id number]  Ortho Devices Type of Ortho Device: Ulna gutter splint, Arm sling Ortho Device/Splint Location: RUE Ortho Device/Splint Interventions: Ordered, Application   Post Interventions Patient Tolerated: Well Instructions Provided: Care of device, Poper ambulation with device  Goldie Dimmer A Nastassja Witkop 08/06/2021, 12:55 PM

## 2021-08-06 NOTE — ED Triage Notes (Signed)
Patient c/o RT hand injury that happened yesterday.   Patient c/o animal bite that happened this morning (0915).   Patient states RT hand injury occurred " after hitting my hand against the refrigerator".   Patient endorses swelling on RT hand.   Patient endorses animal bite occurred on Lft hand.   Patient states animal is up to date with vaccinations.   Patient is unaware of last tetanus vaccination.
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
## 2021-08-09 ENCOUNTER — Other Ambulatory Visit: Payer: Self-pay

## 2021-08-09 ENCOUNTER — Ambulatory Visit (INDEPENDENT_AMBULATORY_CARE_PROVIDER_SITE_OTHER): Payer: 59 | Admitting: Occupational Therapy

## 2021-08-09 ENCOUNTER — Ambulatory Visit (INDEPENDENT_AMBULATORY_CARE_PROVIDER_SITE_OTHER): Payer: 59 | Admitting: Orthopedic Surgery

## 2021-08-09 ENCOUNTER — Encounter: Payer: Self-pay | Admitting: Orthopedic Surgery

## 2021-08-09 ENCOUNTER — Encounter: Payer: Self-pay | Admitting: Occupational Therapy

## 2021-08-09 DIAGNOSIS — M6281 Muscle weakness (generalized): Secondary | ICD-10-CM | POA: Diagnosis not present

## 2021-08-09 DIAGNOSIS — S62339A Displaced fracture of neck of unspecified metacarpal bone, initial encounter for closed fracture: Secondary | ICD-10-CM | POA: Diagnosis not present

## 2021-08-09 DIAGNOSIS — R278 Other lack of coordination: Secondary | ICD-10-CM

## 2021-08-09 DIAGNOSIS — M25641 Stiffness of right hand, not elsewhere classified: Secondary | ICD-10-CM

## 2021-08-09 DIAGNOSIS — M25541 Pain in joints of right hand: Secondary | ICD-10-CM | POA: Diagnosis not present

## 2021-08-09 DIAGNOSIS — R600 Localized edema: Secondary | ICD-10-CM
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
## 2021-08-09 NOTE — Patient Instructions (Signed)
WEARING SCHEDULE:  Wear splint at ALL times except for hygiene care. Wrap your hand before taking a shower.  PURPOSE:  To prevent movement and for protection until injury can heal  CARE OF SPLINT:  Keep splint away from heat sources including: stove, radiator or furnace, or a car in sunlight. The splint can melt and will no longer fit you properly  Keep away from pets and children  Clean the splint with rubbing alcohol or cool water and soap as needed.  * During this time, make sure you also clean your hand/arm as instructed by your therapist and/or perform dressing changes as needed. Then dry hand/arm completely before replacing splint. (When cleaning hand/arm, keep it immobilized in same position until splint is replaced)  PRECAUTIONS/POTENTIAL PROBLEMS: *If you notice or experience increased pain, swelling, numbness, or a lingering reddened area from the splint: Contact your therapist immediately by calling us at [phone] (option 4). You must wear the splint for protection, but we will get you scheduled for adjustments as quickly as possible.  (If only straps or hooks need to be replaced and NO adjustments to the splint need to be made, just call the office ahead and let them know you are coming in)  If you have any medical concerns or signs of infection, please call your doctor immediately

## 2021-08-16 ENCOUNTER — Encounter: Payer: 59 | Admitting: Occupational Therapy

## 2021-08-18 ENCOUNTER — Encounter: Payer: Self-pay | Admitting: Occupational Therapy

## 2021-08-18 ENCOUNTER — Other Ambulatory Visit: Payer: Self-pay

## 2021-08-18 ENCOUNTER — Ambulatory Visit (INDEPENDENT_AMBULATORY_CARE_PROVIDER_SITE_OTHER): Payer: 59 | Admitting: Occupational Therapy

## 2021-08-18 DIAGNOSIS — R278 Other lack of coordination: Secondary | ICD-10-CM | POA: Diagnosis not present

## 2021-08-18 DIAGNOSIS — M25641 Stiffness of right hand, not elsewhere classified: Secondary | ICD-10-CM

## 2021-08-18 DIAGNOSIS — R600 Localized edema: Secondary | ICD-10-CM

## 2021-08-18 DIAGNOSIS — M6281 Muscle weakness (generalized): Secondary | ICD-10-CM | POA: Diagnosis not present

## 2021-08-18 DIAGNOSIS — M25541 Pain in joints of right hand: Secondary | ICD-10-CM | POA: Diagnosis not present
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
## 2021-08-23 ENCOUNTER — Ambulatory Visit: Payer: Self-pay

## 2021-08-23 ENCOUNTER — Encounter: Payer: 59 | Admitting: Occupational Therapy

## 2021-08-23 ENCOUNTER — Ambulatory Visit: Payer: 59 | Admitting: Orthopedic Surgery

## 2021-08-23 ENCOUNTER — Other Ambulatory Visit: Payer: Self-pay

## 2021-08-23 ENCOUNTER — Ambulatory Visit (INDEPENDENT_AMBULATORY_CARE_PROVIDER_SITE_OTHER): Payer: 59 | Admitting: Orthopedic Surgery

## 2021-08-23 DIAGNOSIS — S62339A Displaced fracture of neck of unspecified metacarpal bone, initial encounter for closed fracture: Secondary | ICD-10-CM | POA: Diagnosis not present

## 2021-08-23 DIAGNOSIS — S62339D Displaced fracture of neck of unspecified metacarpal bone, subsequent encounter for fracture with routine healing: Secondary | ICD-10-CM | POA: Diagnosis not present

## 2021-08-23 DIAGNOSIS — W228XXD Striking against or struck by other objects, subsequent encounter: Secondary | ICD-10-CM

## 2021-08-23 DIAGNOSIS — S62336A Displaced fracture of neck of fifth metacarpal bone, right hand, initial encounter for closed fracture: Secondary | ICD-10-CM | POA: Diagnosis not present
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
## 2021-08-30 ENCOUNTER — Encounter: Payer: 59 | Admitting: Occupational Therapy

## 2021-09-06 ENCOUNTER — Encounter: Payer: 59 | Admitting: Occupational Therapy

## 2021-09-23 ENCOUNTER — Other Ambulatory Visit: Payer: Self-pay

## 2021-09-23 ENCOUNTER — Ambulatory Visit (INDEPENDENT_AMBULATORY_CARE_PROVIDER_SITE_OTHER): Payer: 59

## 2021-09-23 ENCOUNTER — Ambulatory Visit (INDEPENDENT_AMBULATORY_CARE_PROVIDER_SITE_OTHER): Payer: 59 | Admitting: Orthopedic Surgery

## 2021-09-23 DIAGNOSIS — S62339A Displaced fracture of neck of unspecified metacarpal bone, initial encounter for closed fracture: Secondary | ICD-10-CM

## 2021-09-23 DIAGNOSIS — S62336A Displaced fracture of neck of fifth metacarpal bone, right hand, initial encounter for closed fracture: Secondary | ICD-10-CM | POA: Diagnosis not present
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
## 2021-10-22 ENCOUNTER — Emergency Department (HOSPITAL_BASED_OUTPATIENT_CLINIC_OR_DEPARTMENT_OTHER): Payer: 59

## 2021-10-22 ENCOUNTER — Encounter (HOSPITAL_BASED_OUTPATIENT_CLINIC_OR_DEPARTMENT_OTHER): Payer: Self-pay

## 2021-10-22 ENCOUNTER — Other Ambulatory Visit: Payer: Self-pay

## 2021-10-22 ENCOUNTER — Emergency Department (HOSPITAL_BASED_OUTPATIENT_CLINIC_OR_DEPARTMENT_OTHER): Payer: 59 | Admitting: Radiology

## 2021-10-22 ENCOUNTER — Emergency Department (HOSPITAL_BASED_OUTPATIENT_CLINIC_OR_DEPARTMENT_OTHER)
Admission: EM | Admit: 2021-10-22 | Discharge: 2021-10-22 | Disposition: A | Discharge status: Home / Self Care | Payer: 59 | Attending: Emergency Medicine | Admitting: Emergency Medicine

## 2021-10-22 DIAGNOSIS — W1831XA Fall on same level due to stepping on an object, initial encounter: Secondary | ICD-10-CM | POA: Diagnosis not present

## 2021-10-22 DIAGNOSIS — S82121A Displaced fracture of lateral condyle of right tibia, initial encounter for closed fracture: Secondary | ICD-10-CM | POA: Insufficient documentation

## 2021-10-22 DIAGNOSIS — Z87891 Personal history of nicotine dependence: Secondary | ICD-10-CM | POA: Diagnosis not present

## 2021-10-22 DIAGNOSIS — Z7951 Long term (current) use of inhaled steroids: Secondary | ICD-10-CM | POA: Insufficient documentation

## 2021-10-22 DIAGNOSIS — S82101A Unspecified fracture of upper end of right tibia, initial encounter for closed fracture: Secondary | ICD-10-CM

## 2021-10-22 DIAGNOSIS — S8991XA Unspecified injury of right lower leg, initial encounter: Secondary | ICD-10-CM | POA: Diagnosis present

## 2021-10-22 DIAGNOSIS — J45909 Unspecified asthma, uncomplicated: Secondary | ICD-10-CM | POA: Insufficient documentation

## 2021-10-22 MED ORDER — IBUPROFEN 800 MG PO TABS
800.0000 mg | ORAL_TABLET | Freq: Once | ORAL | Status: AC
  Administered 2021-10-22: 800 mg via ORAL
  Filled 2021-10-22: qty 1

## 2021-10-22 MED ORDER — ONDANSETRON 4 MG PO TBDP
4.0000 mg | ORAL_TABLET | Freq: Once | ORAL | Status: AC
  Administered 2021-10-22: 4 mg via ORAL
  Filled 2021-10-22: qty 1

## 2021-10-22 MED ORDER — MORPHINE SULFATE (PF) 4 MG/ML IV SOLN
4.0000 mg | Freq: Once | INTRAVENOUS | Status: AC
  Administered 2021-10-22: 4 mg via INTRAVENOUS
  Filled 2021-10-22: qty 1

## 2021-10-22 MED ORDER — HYDROMORPHONE HCL 1 MG/ML IJ SOLN
1.0000 mg | Freq: Once | INTRAMUSCULAR | Status: AC
  Administered 2021-10-22: 1 mg via INTRAVENOUS
  Filled 2021-10-22: qty 1

## 2021-10-22 MED ORDER — OXYCODONE-ACETAMINOPHEN 5-325 MG PO TABS: 1.0000 | ORAL_TABLET | Freq: Four times a day (QID) | ORAL | 0 refills | Status: DC | PRN
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
## 2021-10-22 NOTE — Discharge Instructions (Addendum)
You were seen in the emergency department today after a fall on your right knee.  You do have a fracture just under the knee.  I spoke with orthopedics here who request that you have a knee immobilizer and to follow-up with orthopedics on Monday.  I have also sent a referral over to the orthopedic surgeon Dr. Aundria Rud at [facility].  If you do not hear from them by Monday afternoon please give them a call to make an appointment.  In the meantime he should use crutches to get around.  He should not bear weight on the right leg.  I have also prescribed you Percocet.  You have been prescribed a medication that is considered an opiate. Opiates are pain medications that should be used with caution. It is important that you do not drive while taking this medication as it can cause drowsiness and impaired reaction times. Do not mix this medication with benzodiazepine medications or alcohol as this can cause respiratory depression. Additionally, opiates have addicting properties to them. Please use medication as prescribed by your provider.  Please return to the emergency department if you have significant increasing in pain.

## 2021-10-22 NOTE — ED Notes (Signed)
Dc instructions reviewed with patient. Patient voiced understanding. Dc with belongings.  °

## 2021-10-22 NOTE — ED Notes (Signed)
Patient ambulated well to the BR with Crutches. Patient did complain of Pain but Gait Mobility was proper.

## 2021-10-22 NOTE — ED Notes (Signed)
Patient transported to CT 

## 2021-10-22 NOTE — ED Triage Notes (Signed)
Patient BIB [facility] from Home for Fall.  Patient fell getting OOB this AM missing Step Stool. Patient complaining of Pain to Right Knee.  100 mcg of IV Fentanyl given by [facility].  Patient Tearful in Triage. A&Ox4. GCS 15. BIB Stretcher. [facility] Splint in Place.

## 2021-10-24 ENCOUNTER — Ambulatory Visit (INDEPENDENT_AMBULATORY_CARE_PROVIDER_SITE_OTHER): Payer: 59 | Admitting: Physician Assistant

## 2021-10-24 ENCOUNTER — Other Ambulatory Visit: Payer: Self-pay

## 2021-10-24 ENCOUNTER — Encounter: Payer: Self-pay | Admitting: Physician Assistant

## 2021-10-24 DIAGNOSIS — S82141A Displaced bicondylar fracture of right tibia, initial encounter for closed fracture: Secondary | ICD-10-CM

## 2021-10-24 MED ORDER — OXYCODONE-ACETAMINOPHEN 5-325 MG PO TABS: 1.0000 | ORAL_TABLET | ORAL | 0 refills | Status: DC | PRN
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
## 2021-10-31 ENCOUNTER — Other Ambulatory Visit: Payer: Self-pay | Admitting: Physician Assistant

## 2021-10-31 ENCOUNTER — Telehealth: Payer: Self-pay

## 2021-10-31 ENCOUNTER — Telehealth: Payer: Self-pay | Admitting: Physician Assistant

## 2021-10-31 MED ORDER — OXYCODONE-ACETAMINOPHEN 5-325 MG PO TABS: 1.0000 | ORAL_TABLET | Freq: Four times a day (QID) | ORAL | 0 refills | Status: DC | PRN

## 2021-10-31 NOTE — Telephone Encounter (Signed)
Called in . Tried to leave a message but voicemail wouldn't allow it

## 2021-10-31 NOTE — Telephone Encounter (Signed)
Please provide work status note. Forms have been received. Thanks!

## 2021-10-31 NOTE — Telephone Encounter (Signed)
[employer] forms received. To [employer]. 

## 2021-10-31 NOTE — Telephone Encounter (Signed)
Note in chart 

## 2021-10-31 NOTE — Telephone Encounter (Signed)
Pt called and would like to know if she can have something called in for pain. She stated she is hurting so bad

## 2021-10-31 NOTE — Telephone Encounter (Signed)
Noted for [employer] when forms are completed.

## 2021-11-04 ENCOUNTER — Telehealth: Payer: Self-pay | Admitting: Physician Assistant

## 2021-11-04 NOTE — Telephone Encounter (Signed)
Patient called gave correct phone number to contact her. Patient said she uses the [facility] on [address].  The number to contact patient is [phone]

## 2021-11-07 ENCOUNTER — Other Ambulatory Visit: Payer: Self-pay | Admitting: Physician Assistant

## 2021-11-07 MED ORDER — OXYCODONE-ACETAMINOPHEN 5-325 MG PO TABS: 1.0000 | ORAL_TABLET | Freq: Four times a day (QID) | ORAL | 0 refills | Status: DC | PRN

## 2021-11-07 NOTE — Telephone Encounter (Signed)
Denies any calf pain just pain in the knee. She is taking asa.  Encouraged her to do some range of motion as long as she doesn't bear weight. Should call if any calf pain

## 2021-11-08 ENCOUNTER — Telehealth: Payer: Self-pay | Admitting: Physician Assistant

## 2021-11-08 NOTE — Telephone Encounter (Signed)
[employer] forms received. To [employer].  I have faxed the requested medical records to [facility]. (Form to [employer])

## 2021-11-17 ENCOUNTER — Other Ambulatory Visit: Payer: Self-pay | Admitting: Physician Assistant

## 2021-11-22 ENCOUNTER — Encounter: Payer: Self-pay | Admitting: Orthopaedic Surgery

## 2021-11-22 ENCOUNTER — Ambulatory Visit (INDEPENDENT_AMBULATORY_CARE_PROVIDER_SITE_OTHER): Payer: 59

## 2021-11-22 ENCOUNTER — Ambulatory Visit (INDEPENDENT_AMBULATORY_CARE_PROVIDER_SITE_OTHER): Payer: 59 | Admitting: Orthopaedic Surgery

## 2021-11-22 ENCOUNTER — Other Ambulatory Visit: Payer: Self-pay

## 2021-11-22 DIAGNOSIS — S82141A Displaced bicondylar fracture of right tibia, initial encounter for closed fracture: Secondary | ICD-10-CM
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
## 2021-12-07 ENCOUNTER — Other Ambulatory Visit: Payer: Self-pay

## 2021-12-07 ENCOUNTER — Encounter: Payer: Self-pay | Admitting: Orthopaedic Surgery

## 2021-12-07 ENCOUNTER — Ambulatory Visit (INDEPENDENT_AMBULATORY_CARE_PROVIDER_SITE_OTHER): Payer: 59

## 2021-12-07 ENCOUNTER — Ambulatory Visit (INDEPENDENT_AMBULATORY_CARE_PROVIDER_SITE_OTHER): Payer: 59 | Admitting: Orthopaedic Surgery

## 2021-12-07 DIAGNOSIS — S82141A Displaced bicondylar fracture of right tibia, initial encounter for closed fracture: Secondary | ICD-10-CM
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
## 2022-01-19 ENCOUNTER — Other Ambulatory Visit: Payer: Self-pay

## 2022-01-19 ENCOUNTER — Emergency Department (HOSPITAL_BASED_OUTPATIENT_CLINIC_OR_DEPARTMENT_OTHER)
Admission: EM | Admit: 2022-01-19 | Discharge: 2022-01-19 | Disposition: A | Discharge status: Home / Self Care | Payer: 59 | Attending: Emergency Medicine | Admitting: Emergency Medicine

## 2022-01-19 ENCOUNTER — Encounter (HOSPITAL_BASED_OUTPATIENT_CLINIC_OR_DEPARTMENT_OTHER): Payer: Self-pay | Admitting: Emergency Medicine

## 2022-01-19 DIAGNOSIS — G8929 Other chronic pain: Secondary | ICD-10-CM | POA: Diagnosis not present

## 2022-01-19 DIAGNOSIS — J0110 Acute frontal sinusitis, unspecified: Secondary | ICD-10-CM

## 2022-01-19 DIAGNOSIS — M25561 Pain in right knee: Secondary | ICD-10-CM | POA: Diagnosis not present

## 2022-01-19 DIAGNOSIS — G43909 Migraine, unspecified, not intractable, without status migrainosus: Secondary | ICD-10-CM | POA: Diagnosis present

## 2022-01-19 MED ORDER — KETOROLAC TROMETHAMINE 15 MG/ML IJ SOLN
15.0000 mg | Freq: Once | INTRAMUSCULAR | Status: AC
  Administered 2022-01-19: 15 mg via INTRAMUSCULAR
  Filled 2022-01-19: qty 1

## 2022-01-19 NOTE — ED Triage Notes (Signed)
Pt arrives to ED with c/o migraine for the past four days. Associated symptoms include sinus congestion, rhinorrhea.  ?

## 2022-01-19 NOTE — ED Provider Notes (Signed)
[facility] [place] EMERGENCY DEPT Provider Note   CSN: [id number] Arrival date & time: 01/19/22  1241     History  Chief Complaint  Patient presents with   Migraine    Angelica Miller is a 56 y.o. female who presents to the emergency department complaining of frontal, 9/10, constant, migraine x4 days.  Denies sick contacts.  Has associated rhinorrhea, sinus congestion, photophobia, phonophobia.  Patient has a history of migraines and this headache is similar.  She has tried Excedrin Migraine with relief of her symptoms.  It causes her to fall asleep. Denies nasal congestion, ear pain, sore throat, cough, vision change, nausea, vomiting.  Denies recent injury, trauma, fall.  Patient also notes right knee pain that is chronic status post fracture and December 2022.  She is followed by [facility] and notes that she was just released at her last visit in January 2023.  Denies any new injury, trauma, fall.  She has taken Tylenol with no relief of her symptoms.  She also wears a knee sleeve.  Patient notes associated intermittent swelling to right knee.  Denies color change, gait problem.  The history is provided by the patient. No language interpreter was used.      Home Medications Prior to Admission medications   Medication Sig Start Date End Date Taking? Authorizing Provider  albuterol (VENTOLIN HFA) 108 (90 Base) MCG/ACT inhaler Inhale 1-2 puffs into the lungs every 6 (six) hours as needed for wheezing or shortness of breath. 09/07/19   Fransico Meadow, PA-C  amoxicillin-clavulanate (AUGMENTIN) 875-125 MG tablet Take 1 tablet by mouth every 12 (twelve) hours. 02/23/21   Corena Herter, PA-C  atorvastatin (LIPITOR) 20 MG tablet Take 20 mg by mouth daily. 09/27/18   [provider]  cyclobenzaprine (FLEXERIL) 10 MG tablet Take 1 tablet (10 mg total) by mouth 2 (two) times daily as needed for muscle spasms. 11/11/20   Etta Quill, NP  diclofenac Sodium (VOLTAREN) 1 %  GEL Apply 2 g topically 4 (four) times daily. 08/12/20   Khatri, Hina, PA-C  ipratropium-albuterol (DUONEB) 0.5-2.5 (3) MG/3ML SOLN Take 3 mLs by nebulization every 4 (four) hours as needed. 06/18/17   Mesner, Corene Cornea, MD  meloxicam (MOBIC) 7.5 MG tablet Take 1 tablet (7.5 mg total) by mouth daily. 06/15/21   Hughie Closs, PA-C  montelukast (SINGULAIR) 10 MG tablet Take 1 tablet (10 mg total) by mouth at bedtime. 02/27/17   Mack Hook, MD  oxyCODONE-acetaminophen (PERCOCET/ROXICET) 5-325 MG tablet Take 1 tablet by mouth every 6 (six) hours as needed for severe pain. 11/07/21   Persons, Bevely Palmer, PA  Vitamin D, Ergocalciferol, (DRISDOL) 1.25 MG (50000 UT) CAPS capsule Take 50,000 Units by mouth once a week. mondays 10/10/18   [provider]  fexofenadine (ALLEGRA) 180 MG tablet Take 1 tablet (180 mg total) by mouth daily. Patient not taking: Reported on 10/04/2017 02/27/17 02/26/20  Mack Hook, MD  fluticasone [facility]) 50 MCG/ACT nasal spray Place 1 spray into both nostrils daily. Patient not taking: Reported on 04/04/2018 12/26/17 02/26/20  Delia Heady, PA-C      Allergies    Patient has no known allergies.    Review of Systems   Review of Systems  Constitutional:  Negative for chills and fever.  HENT:  Positive for congestion and rhinorrhea. Negative for sore throat.   Eyes:  Positive for photophobia. Negative for visual disturbance.  Gastrointestinal:  Negative for nausea and vomiting.  Musculoskeletal:  Positive for arthralgias and joint  swelling. Negative for gait problem.  Skin:  Negative for color change, rash and wound.  Neurological:  Positive for headaches. Negative for syncope, weakness and numbness.  All other systems reviewed and are negative.  Physical Exam Updated Vital Signs BP 135/79    Pulse 98    Temp 99 F (37.2 C) (Oral)    Resp 16    Ht 5\' 2"  (1.575 m)    Wt 78 kg    LMP  (LMP Unknown)    SpO2 94%    BMI 31.45 kg/m  Physical Exam Vitals and  nursing note reviewed.  Constitutional:      General: She is not in acute distress.    Appearance: She is not diaphoretic.  HENT:     Head: Normocephalic and atraumatic.     Nose:     Right Sinus: Maxillary sinus tenderness and frontal sinus tenderness present.     Left Sinus: Maxillary sinus tenderness and frontal sinus tenderness present.     Comments: Bilateral frontal and maxillary sinus tenderness to palpation.    Mouth/Throat:     Mouth: Mucous membranes are moist.     Pharynx: Oropharynx is clear. Uvula midline. No pharyngeal swelling, oropharyngeal exudate, posterior oropharyngeal erythema or uvula swelling.  Eyes:     General: No scleral icterus.    Conjunctiva/sclera: Conjunctivae normal.  Cardiovascular:     Rate and Rhythm: Normal rate and regular rhythm.     Pulses: Normal pulses.     Heart sounds: Normal heart sounds.  Pulmonary:     Effort: Pulmonary effort is normal. No respiratory distress.     Breath sounds: Normal breath sounds. No wheezing.  Abdominal:     General: Bowel sounds are normal.     Palpations: Abdomen is soft. There is no mass.     Tenderness: There is no abdominal tenderness. There is no guarding or rebound.  Musculoskeletal:        General: Normal range of motion.     Cervical back: Normal range of motion and neck supple.     Right knee: No swelling, deformity, effusion, erythema, ecchymosis, lacerations, bony tenderness or crepitus. Normal range of motion. Tenderness present over the medial joint line and lateral joint line.     Left knee: Normal.     Comments: Tenderness to palpation noted to the lateral and medial aspect of right knee.  No obvious swelling, deformity, effusion.  No increased warmth noted to the joint.  Full active range of motion of right knee.  No TTP noted to BLE. Patient able to ambulate without difficulty or assistance.  Pedal pulses intact bilaterally.  Strength and sensation intact to bilateral upper and lower extremities.   Skin:    General: Skin is warm and dry.  Neurological:     Mental Status: She is alert.     Cranial Nerves: Cranial nerves 2-12 are intact.     Sensory: Sensation is intact.     Comments: No focal neurological deficits.  Psychiatric:        Behavior: Behavior normal.    ED Results / Procedures / Treatments   Labs (all labs ordered are listed, but only abnormal results are displayed) Labs Reviewed - No data to display  EKG None  Radiology No results found.  Procedures Procedures    Medications Ordered in ED Medications  ketorolac (TORADOL) 15 MG/ML injection 15 mg (15 mg Intramuscular Given 01/19/22 1402)    ED Course/ Medical Decision Making/ A&P Clinical Course as  of 01/19/22 1500  Thu Jan 19, 2022  1337 Pt able to ambulate without assistance or difficulty.  [SB]  [place] and flu swab today to patient, patient declines at this time.  Will give IM dose of Toradol in the ED.   [SB]  1448 Patient noted improvement of symptoms with Toradol in the ED.  Discussed discharge treatment plan with patient at bedside.  Patient agreeable at this time. Patient appears safe for discharge. [SB]    Clinical Course User Index [SB] Gabriella Guile A, PA-C                           Medical Decision Making Risk Prescription drug management.   Pt presents with frontal constant migraine x4 days.  Patient has photophobia and phonophobia.  Tried Excedrin Migraine with mild relief.  Patient has a history of migraines and this headache is similar.  Also has associated rhinorrhea, sinus congestion.  Denies sick contacts.  Vital signs, stable, patient afebrile, not tachycardic or hypoxic. On exam, pt with frontal and maxillary sinus tenderness to palpation.  No focal neurological deficits.  No acute cardiovascular, respiratory, abdominal exam findings. Differential diagnosis includes sinusitis, tension headache, migraine, SAH, ICH.   Patient also presents with right knee pain.  Patient  had fracture to the tibial plateau in December 2022.  She has since been followed by Dr. Durward Fortes at [facility].  Most recent visit on 12/07/2021.  Patient without any new injury, trauma, fall.  Has tried knee sleeve with no relief of symptoms.  Differential diagnosis strain, fracture, dislocation, septic arthritis.  Additional history obtained:  External records from outside source obtained and reviewed including: Per patient chart review: Patient was evaluated by her orthopedist, Dr. Durward Fortes on 12/07/2021, at that time she was 6 weeks status post the minimally depressed right lateral tibial plateau fracture.  Patient also has advanced arthritis of her knee.  Patient was weaned out of her brace and given knee strengthening exercises and quadricep strengthening exercises.  Patient was instructed to follow-up as needed.  Medications:  I ordered medication including Toradol for symptom management Reevaluation of the patient after these medicines and interventions, I reevaluated the patient and found that they have improved I have reviewed the patients home medicines and have made adjustments as needed   Disposition: Patient presentation suspicious for sinusitis.  Doubt SAH, ICH at this time, no focal neurological deficits, vital signs stable, headache similar in presentation to previous migraines. Also suspicious for chronic right knee pain status post fracture in December 2022.  No new injury, trauma, fall.  No need for additional imaging work-up at this time.  Doubt fracture, dislocation at this time.  Doubt septic arthritis, patient afebrile, no increased warmth or erythema noted to the joint.  Patient with full active range of motion of right knee. After consideration of the diagnostic results and the patients response to treatment, I feel that the patient would benefit from Discharge home.  Instructed patient to call her orthopedist today to set up a follow-up appointment regarding today's ED visit.   Will provide patient with Ace wrap in the ED.  Supportive care measures and strict return precautions discussed with patient at bedside. Pt acknowledges and verbalizes understanding. Pt appears safe for discharge. Follow up as indicated in discharge paperwork.    This chart was dictated using voice recognition software, Dragon. Despite the best efforts of this provider to proofread and correct errors, errors may  still occur which can change documentation meaning.  Final Clinical Impression(s) / ED Diagnoses Final diagnoses:  Acute non-recurrent frontal sinusitis  Chronic pain of right knee    Rx / DC Orders ED Discharge Orders     None         Sharonann Malbrough A, PA-C 01/19/22 1501    Lacretia Leigh, MD 01/21/22 1230

## 2022-01-19 NOTE — Discharge Instructions (Addendum)
It was a pleasure taking care of you today! ? ?You may continue taking over-the-counter Excedrin as directed for your headache.  You may take over-the-counter Mucinex as directed for your symptoms.  You may also take over-the-counter Flonase up to 1-2 sprays into both nostrils daily.  You may take over-the-counter 600 mg ibuprofen every 6 hours or 500 mg Tylenol every 6 hours as needed for pain for no more than 7 days.  You will be given an Ace wrap in the ED, ensure to elevate your legs to aid in decrease swelling.  You may place ice or heat to the affected area for up to 15 minutes at a time.  Ensure to place a barrier between your skin and the ice/heat.  You may follow-up with your primary care provider as needed.  Return to the emergency department for experiencing increasing/worsening vomiting, vision changes, headache, worsening symptoms. ?

## 2022-01-19 NOTE — ED Notes (Signed)
Dc instructions reviewed with patient. Patient voiced understanding. Dc with belongings.  °

## 2022-01-26 ENCOUNTER — Telehealth: Payer: Self-pay

## 2022-01-26 ENCOUNTER — Ambulatory Visit (INDEPENDENT_AMBULATORY_CARE_PROVIDER_SITE_OTHER): Payer: 59

## 2022-01-26 ENCOUNTER — Other Ambulatory Visit: Payer: Self-pay

## 2022-01-26 ENCOUNTER — Ambulatory Visit (INDEPENDENT_AMBULATORY_CARE_PROVIDER_SITE_OTHER): Payer: 59 | Admitting: Physician Assistant

## 2022-01-26 DIAGNOSIS — S82141A Displaced bicondylar fracture of right tibia, initial encounter for closed fracture: Secondary | ICD-10-CM

## 2022-01-26 DIAGNOSIS — M170 Bilateral primary osteoarthritis of knee: Secondary | ICD-10-CM | POA: Insufficient documentation

## 2022-01-26 DIAGNOSIS — M1711 Unilateral primary osteoarthritis, right knee: Secondary | ICD-10-CM | POA: Diagnosis not present

## 2022-01-26 MED ORDER — MELOXICAM 15 MG PO TABS: 15.0000 mg | ORAL_TABLET | Freq: Every day | ORAL | 1 refills | Status: DC

## 2022-01-26 NOTE — Telephone Encounter (Signed)
Right knee gel injection  
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
## 2022-01-26 NOTE — Telephone Encounter (Signed)
Noted  

## 2022-02-01 NOTE — Telephone Encounter (Signed)
Talked with patient and advised her that her insurance does not cover gel injections.  Advised patient of TriVisc, but patient declined. ?

## 2022-02-22 ENCOUNTER — Other Ambulatory Visit: Payer: Self-pay

## 2022-02-22 ENCOUNTER — Emergency Department (HOSPITAL_BASED_OUTPATIENT_CLINIC_OR_DEPARTMENT_OTHER): Payer: 59 | Admitting: Radiology

## 2022-02-22 ENCOUNTER — Encounter (HOSPITAL_BASED_OUTPATIENT_CLINIC_OR_DEPARTMENT_OTHER): Payer: Self-pay | Admitting: Emergency Medicine

## 2022-02-22 ENCOUNTER — Emergency Department (HOSPITAL_BASED_OUTPATIENT_CLINIC_OR_DEPARTMENT_OTHER)
Admission: EM | Admit: 2022-02-22 | Discharge: 2022-02-23 | Disposition: A | Discharge status: Home / Self Care | Payer: 59 | Attending: Emergency Medicine | Admitting: Emergency Medicine

## 2022-02-22 DIAGNOSIS — M25562 Pain in left knee: Secondary | ICD-10-CM | POA: Diagnosis not present

## 2022-02-22 DIAGNOSIS — Z7951 Long term (current) use of inhaled steroids: Secondary | ICD-10-CM | POA: Diagnosis not present

## 2022-02-22 DIAGNOSIS — J45909 Unspecified asthma, uncomplicated: Secondary | ICD-10-CM | POA: Diagnosis not present

## 2022-02-22 MED ORDER — LIDOCAINE 5 % EX PTCH: 1.0000 | MEDICATED_PATCH | CUTANEOUS | 0 refills | Status: DC

## 2022-02-22 MED ORDER — IBUPROFEN 800 MG PO TABS
800.0000 mg | ORAL_TABLET | Freq: Once | ORAL | Status: AC
  Administered 2022-02-22: 800 mg via ORAL
  Filled 2022-02-22: qty 1

## 2022-02-22 MED ORDER — NAPROXEN 375 MG PO TABS: 375.0000 mg | ORAL_TABLET | Freq: Two times a day (BID) | ORAL | 0 refills | Status: DC

## 2022-02-22 MED ORDER — LIDOCAINE 5 % EX PTCH
2.0000 | MEDICATED_PATCH | CUTANEOUS | Status: DC
  Administered 2022-02-22: 2 via TRANSDERMAL
  Filled 2022-02-22: qty 2

## 2022-02-22 MED ORDER — ACETAMINOPHEN 500 MG PO TABS
1000.0000 mg | ORAL_TABLET | Freq: Once | ORAL | Status: DC
  Filled 2022-02-22: qty 2
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
## 2022-02-22 NOTE — ED Triage Notes (Signed)
Patient c/o left knee pain after falling at work yesterday.  Patient ambulates with a limp.  ?

## 2022-03-09 ENCOUNTER — Ambulatory Visit (HOSPITAL_BASED_OUTPATIENT_CLINIC_OR_DEPARTMENT_OTHER): Payer: 59 | Admitting: Family Medicine

## 2022-03-16 ENCOUNTER — Ambulatory Visit (INDEPENDENT_AMBULATORY_CARE_PROVIDER_SITE_OTHER): Payer: 59 | Admitting: Family Medicine

## 2022-03-16 ENCOUNTER — Encounter (HOSPITAL_BASED_OUTPATIENT_CLINIC_OR_DEPARTMENT_OTHER): Payer: Self-pay | Admitting: Family Medicine

## 2022-03-16 DIAGNOSIS — E559 Vitamin D deficiency, unspecified: Secondary | ICD-10-CM | POA: Insufficient documentation

## 2022-03-16 DIAGNOSIS — Z0000 Encounter for general adult medical examination without abnormal findings: Secondary | ICD-10-CM

## 2022-03-16 DIAGNOSIS — G43909 Migraine, unspecified, not intractable, without status migrainosus: Secondary | ICD-10-CM | POA: Diagnosis not present

## 2022-03-16 DIAGNOSIS — R0681 Apnea, not elsewhere classified: Secondary | ICD-10-CM | POA: Insufficient documentation

## 2022-03-16 DIAGNOSIS — E785 Hyperlipidemia, unspecified: Secondary | ICD-10-CM | POA: Diagnosis not present
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
## 2022-03-16 NOTE — Patient Instructions (Signed)
?  Medication Instructions:  ?Your physician recommends that you continue on your current medications as directed. Please refer to the Current Medication list given to you today. ?--If you need a refill on any your medications before your next appointment, please call your pharmacy first. If no refills are authorized on file call the office.-- ? ? ?Follow-Up: ?Your next appointment:   ?Your physician recommends that you schedule a follow-up appointment in 2 months for CPE with Dr. Tennis Must [place] ? ?You will receive a text message or e-mail with a link to a survey about your care and experience with [place] today! We would greatly appreciate your feedback!  ? ?Thanks for letting us be apart of your health journey!!  ?Primary Care and Sports Medicine  ? ?Dr. Kyung Rudd de [place]  ? ?We encourage you to activate your patient portal called "MyChart".  Sign up information is provided on this After Visit Summary.  MyChart is used to connect with patients for Virtual Visits (Telemedicine).  Patients are able to view lab/test results, encounter notes, upcoming appointments, etc.  Non-urgent messages can be sent to your provider as well. To learn more about what you can do with MyChart, please visit --  [email or web address].    ?

## 2022-03-16 NOTE — Assessment & Plan Note (Signed)
Concern for underlying sleep apnea, will refer to pulmonology for further evaluation and recommendations ?

## 2022-03-16 NOTE — Assessment & Plan Note (Signed)
Not currently on medications, was prescribed statin at 1 point the past ?Plan to check lipid panel with upcoming labs for CPE ?

## 2022-03-16 NOTE — Assessment & Plan Note (Signed)
She reports being diagnosed in the past, has had some worsening ?Medications have generally been OTC medications including Excedrin which generally would recommend avoiding given longstanding history, worsening in recent months, patient age, will refer to neurology for further evaluation and recommendations ?

## 2022-03-16 NOTE — Assessment & Plan Note (Signed)
Plan to check vitamin D level with upcoming labs for CPE ?Recommendations regarding vitamin D supplementation pending lab result ?

## 2022-04-12 ENCOUNTER — Encounter (HOSPITAL_BASED_OUTPATIENT_CLINIC_OR_DEPARTMENT_OTHER): Payer: Self-pay | Admitting: Family Medicine

## 2022-04-12 ENCOUNTER — Ambulatory Visit (INDEPENDENT_AMBULATORY_CARE_PROVIDER_SITE_OTHER): Payer: 59 | Admitting: Family Medicine

## 2022-04-12 DIAGNOSIS — G43909 Migraine, unspecified, not intractable, without status migrainosus: Secondary | ICD-10-CM

## 2022-04-12 MED ORDER — DEXAMETHASONE SODIUM PHOSPHATE 10 MG/ML IJ SOLN
10.0000 mg | Freq: Once | INTRAMUSCULAR | Status: AC
  Administered 2022-04-12: 10 mg via INTRAMUSCULAR

## 2022-04-12 MED ORDER — KETOROLAC TROMETHAMINE 60 MG/2ML IM SOLN
60.0000 mg | Freq: Once | INTRAMUSCULAR | Status: AC
  Administered 2022-04-12: 60 mg via INTRAMUSCULAR

## 2022-04-12 NOTE — Progress Notes (Signed)
Pt was here for an Toradol injection for migraines. Pt received injection in the right ventrolateral for injection. Pt did well with injection without any issues at all.

## 2022-04-12 NOTE — Progress Notes (Signed)
   Virtual Visit via Telephone   I connected with  Angelica Miller  on 04/12/22 by telephone/telehealth and verified that I am speaking with the correct person using two identifiers.   I discussed the limitations, risks, security and privacy concerns of performing an evaluation and management service by telephone, including the higher likelihood of inaccurate diagnosis and treatment, and the availability of in person appointments.  We also discussed the likely need of an additional face to face encounter for complete and high quality delivery of care.  I also discussed with the patient that there may be a patient responsible charge related to this service. The patient expressed understanding and wishes to proceed.  Provider location is in medical facility. Patient location is at their home, different from provider location. People involved in care of the patient during this telehealth encounter were myself, my nurse/medical assistant, and my front office/scheduling team member.  Review of Systems: No fevers, chills, night sweats, weight loss, chest pain, or shortness of breath.   Objective Findings:    General: Speaking full sentences, no audible heavy breathing.  Sounds alert and appropriately interactive.    Independent interpretation of tests performed by another provider:   None.  Brief History, Exam, Impression, and Recommendations:    Migraines Patient has been experiencing symptoms for about 2 days now.  Initially started with headache, she has also had some associated photophobia, dizziness.  Symptoms do feel similar to prior migraines, however this episode does feel more severe than typical.  Previously she established with [place] and was referred to neurology for further evaluation of history of migraines.  She has schedule appointment for next week to establish with a neurologist. She has tried ibuprofen, Tylenol, Excedrin to help with headache without significant relief.  She  feels that she needs to be in a low light environment and has remained in her bedroom as result.  Other than photophobia, no specific visual disturbances. Discussed treatment options with patient, she would be interested in intramuscular injection of Toradol as well as dexamethasone in order to try to get better control of symptoms and reduce recurrence of headache Patient will come by the office this afternoon in order to proceed with these injections  I discussed the above assessment and treatment plan with the patient. The patient was provided an opportunity to ask questions and all were answered. The patient agreed with the plan and demonstrated an understanding of the instructions.  The patient was advised to call back or seek an in-person evaluation if the symptoms worsen or if the condition fails to improve as anticipated.  I provided 10 minutes of face to face and non-face-to-face time during this encounter date, time was needed to gather information, review chart, records, communicate/coordinate with staff remotely, as well as complete documentation.   ___________________________________________ Murdis Flitton de [place], MD, ABFM, CAQSM Primary Care and Sports Medicine [facility]

## 2022-04-12 NOTE — Assessment & Plan Note (Signed)
Patient has been experiencing symptoms for about 2 days now.  Initially started with headache, she has also had some associated photophobia, dizziness.  Symptoms do feel similar to prior migraines, however this episode does feel more severe than typical.  Previously she established with [place] and was referred to neurology for further evaluation of history of migraines.  She has schedule appointment for next week to establish with a neurologist. She has tried ibuprofen, Tylenol, Excedrin to help with headache without significant relief.  She feels that she needs to be in a low light environment and has remained in her bedroom as result.  Other than photophobia, no specific visual disturbances. Discussed treatment options with patient, she would be interested in intramuscular injection of Toradol as well as dexamethasone in order to try to get better control of symptoms and reduce recurrence of headache Patient will come by the office this afternoon in order to proceed with these injections

## 2022-04-14 ENCOUNTER — Institutional Professional Consult (permissible substitution): Payer: 59 | Admitting: Primary Care
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
## 2022-04-19 ENCOUNTER — Telehealth: Payer: Self-pay | Admitting: Psychiatry

## 2022-04-19 ENCOUNTER — Ambulatory Visit: Payer: 59 | Admitting: Psychiatry

## 2022-04-19 NOTE — Telephone Encounter (Signed)
Pt cancelled appt due to financial issues. Rescheduled appt.

## 2022-04-21 ENCOUNTER — Ambulatory Visit (INDEPENDENT_AMBULATORY_CARE_PROVIDER_SITE_OTHER): Payer: 59 | Admitting: Primary Care

## 2022-04-21 ENCOUNTER — Encounter: Payer: Self-pay | Admitting: Primary Care

## 2022-04-21 VITALS — BP 118/70 | HR 86 | Temp 97.6°F | Ht 62.0 in | Wt 160.2 lb

## 2022-04-21 DIAGNOSIS — R0683 Snoring: Secondary | ICD-10-CM | POA: Diagnosis not present

## 2022-04-21 DIAGNOSIS — R059 Cough, unspecified: Secondary | ICD-10-CM | POA: Insufficient documentation
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
## 2022-04-21 NOTE — Assessment & Plan Note (Addendum)
-   Patient has symptoms of loud snoring, witnessed apnea, restless sleep.  Epworth 8. BMI 29.  Concern patient could have obstructive sleep apnea, needs home sleep study to evaluate. Discussed risk of untreated sleep apnea including cardiac arrhythmias, pulm HTN, stroke, DM. We briefly reviewed treatment options. Encouraged patient to work on weight loss efforts and focus on side sleeping position/elevate head of bed. Advised against driving if experiencing excessive daytime sleepiness. Follow-up in 4-6 weeks to review sleep study results and discuss treatment options further

## 2022-04-21 NOTE — Patient Instructions (Signed)
Sleep apnea is defined as period of 10 seconds or longer when you stop breathing at night. This can happen multiple times a night. Dx sleep apnea is when this occurs more than 5 times an hour.    Mild OSA 5-15 apneic events an hour Moderate OSA 15-30 apneic events an hour Severe OSA > 30 apneic events an hour   Untreated sleep apnea puts you at higher risk for cardiac arrhythmias, pulmonary HTN, stroke and diabetes   Treatment options include weight loss, side sleeping position, oral appliance, CPAP therapy or referral to ENT for possible surgical options    Recommendations: Focus on side sleeping position Work on weight loss efforts if needed  Do not drive if experiencing excessive daytime sleepiness of fatigue    Orders: Home sleep study re: loud snoring    Follow-up: Please scheduled 6-7 week follow-up visit Beth NP to review sleep study results and treatment if needed

## 2022-04-24 NOTE — Progress Notes (Signed)
Reviewed and agree with assessment/plan.   Vannia Pola, MD [facility] 04/24/2022, 1:49 PM Pager:  [phone]  

## 2022-05-03 ENCOUNTER — Ambulatory Visit: Payer: 59

## 2022-05-03 DIAGNOSIS — R0683 Snoring: Secondary | ICD-10-CM | POA: Diagnosis not present

## 2022-05-05 DIAGNOSIS — R0683 Snoring: Secondary | ICD-10-CM

## 2022-05-08 ENCOUNTER — Other Ambulatory Visit (HOSPITAL_BASED_OUTPATIENT_CLINIC_OR_DEPARTMENT_OTHER): Payer: Self-pay

## 2022-05-08 ENCOUNTER — Encounter (HOSPITAL_BASED_OUTPATIENT_CLINIC_OR_DEPARTMENT_OTHER): Payer: Self-pay

## 2022-05-08 ENCOUNTER — Ambulatory Visit (HOSPITAL_BASED_OUTPATIENT_CLINIC_OR_DEPARTMENT_OTHER): Payer: 59

## 2022-05-08 DIAGNOSIS — Z0000 Encounter for general adult medical examination without abnormal findings: Secondary | ICD-10-CM

## 2022-05-08 DIAGNOSIS — E559 Vitamin D deficiency, unspecified: Secondary | ICD-10-CM

## 2022-05-09 LAB — CBC WITH DIFFERENTIAL/PLATELET
Basophils Absolute: 0 10*3/uL (ref 0.0–0.2)
Basos: 0 %
EOS (ABSOLUTE): 0 10*3/uL (ref 0.0–0.4)
Eos: 1 %
Hematocrit: 41.2 % (ref 34.0–46.6)
Hemoglobin: 13.7 g/dL (ref 11.1–15.9)
Immature Grans (Abs): 0 10*3/uL (ref 0.0–0.1)
Immature Granulocytes: 0 %
Lymphocytes Absolute: 2.6 10*3/uL (ref 0.7–3.1)
Lymphs: 42 %
MCH: 31 pg (ref 26.6–33.0)
MCHC: 33.3 g/dL (ref 31.5–35.7)
MCV: 93 fL (ref 79–97)
Monocytes Absolute: 0.5 10*3/uL (ref 0.1–0.9)
Monocytes: 8 %
Neutrophils Absolute: 3 10*3/uL (ref 1.4–7.0)
Neutrophils: 49 %
Platelets: 308 10*3/uL (ref 150–450)
RBC: 4.42 x10E6/uL (ref 3.77–5.28)
RDW: 13.1 % (ref 11.7–15.4)
WBC: 6.1 10*3/uL (ref 3.4–10.8)

## 2022-05-09 LAB — COMPREHENSIVE METABOLIC PANEL
ALT: 12 IU/L (ref 0–32)
AST: 16 IU/L (ref 0–40)
Albumin/Globulin Ratio: 1.5 (ref 1.2–2.2)
Albumin: 4 g/dL (ref 3.8–4.9)
Alkaline Phosphatase: 74 IU/L (ref 44–121)
BUN/Creatinine Ratio: 18 (ref 9–23)
BUN: 13 mg/dL (ref 6–24)
Bilirubin Total: <0.2 mg/dL (ref 0.0–1.2)
CO2: 24 mmol/L (ref 20–29)
Calcium: 8.8 mg/dL (ref 8.7–10.2)
Chloride: 107 mmol/L — ABNORMAL HIGH (ref 96–106)
Creatinine, Ser: 0.74 mg/dL (ref 0.57–1.00)
Globulin, Total: 2.7 g/dL (ref 1.5–4.5)
Glucose: 97 mg/dL (ref 70–99)
Potassium: 4.6 mmol/L (ref 3.5–5.2)
Sodium: 143 mmol/L (ref 134–144)
Total Protein: 6.7 g/dL (ref 6.0–8.5)
eGFR: 95 mL/min/{1.73_m2} (ref >59)

## 2022-05-09 LAB — LIPID PANEL
Chol/HDL Ratio: 4.1 ratio (ref 0.0–4.4)
Cholesterol, Total: 268 mg/dL — ABNORMAL HIGH (ref 100–199)
HDL: 65 mg/dL (ref >39)
LDL Chol Calc (NIH): 180 mg/dL — ABNORMAL HIGH (ref 0–99)
Triglycerides: 131 mg/dL (ref 0–149)
VLDL Cholesterol Cal: 23 mg/dL (ref 5–40)

## 2022-05-09 LAB — HEMOGLOBIN A1C
Est. average glucose Bld gHb Est-mCnc: 114 mg/dL
Hgb A1c MFr Bld: 5.6 % (ref 4.8–5.6)

## 2022-05-09 LAB — VITAMIN D 25 HYDROXY (VIT D DEFICIENCY, FRACTURES): Vit D, 25-Hydroxy: 8.5 ng/mL — ABNORMAL LOW (ref 30.0–100.0)

## 2022-05-09 LAB — TSH RFX ON ABNORMAL TO FREE T4: TSH: 1.78 u[IU]/mL (ref 0.450–4.500)

## 2022-05-12 ENCOUNTER — Telehealth: Payer: Self-pay | Admitting: Primary Care

## 2022-05-12 DIAGNOSIS — R0683 Snoring: Secondary | ICD-10-CM

## 2022-05-12 NOTE — Telephone Encounter (Signed)
Home sleep study on 05/03/2022 showed no evidence of obstructive sleep apnea.  AHI 2.2/hour with SPO2 low 87% with an average of 94%.  Patient does not need CPAP therapy.  We can refer to orthodontics for oral device for management/treatment of snoring.  If patient has any additional questions please set up for virtual visit to review sleep study further
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
## 2022-05-17 ENCOUNTER — Ambulatory Visit: Payer: Self-pay | Admitting: Psychiatry

## 2022-05-17 ENCOUNTER — Ambulatory Visit (INDEPENDENT_AMBULATORY_CARE_PROVIDER_SITE_OTHER): Payer: 59 | Admitting: Family Medicine

## 2022-05-17 ENCOUNTER — Encounter (HOSPITAL_BASED_OUTPATIENT_CLINIC_OR_DEPARTMENT_OTHER): Payer: Self-pay | Admitting: Family Medicine

## 2022-05-17 VITALS — BP 117/72 | HR 80 | Ht 62.0 in | Wt 160.0 lb

## 2022-05-17 DIAGNOSIS — M1711 Unilateral primary osteoarthritis, right knee: Secondary | ICD-10-CM

## 2022-05-17 DIAGNOSIS — Z1231 Encounter for screening mammogram for malignant neoplasm of breast: Secondary | ICD-10-CM

## 2022-05-17 DIAGNOSIS — Z0000 Encounter for general adult medical examination without abnormal findings: Secondary | ICD-10-CM | POA: Diagnosis not present

## 2022-05-17 DIAGNOSIS — Z23 Encounter for immunization: Secondary | ICD-10-CM

## 2022-05-17 DIAGNOSIS — E559 Vitamin D deficiency, unspecified: Secondary | ICD-10-CM | POA: Diagnosis not present

## 2022-05-17 DIAGNOSIS — G43909 Migraine, unspecified, not intractable, without status migrainosus: Secondary | ICD-10-CM

## 2022-05-17 DIAGNOSIS — Z7689 Persons encountering health services in other specified circumstances: Secondary | ICD-10-CM

## 2022-05-17 DIAGNOSIS — Z1211 Encounter for screening for malignant neoplasm of colon: Secondary | ICD-10-CM

## 2022-05-17 MED ORDER — SHINGRIX 50 MCG/0.5ML IM SUSR: 0.5000 mL | Freq: Once | INTRAMUSCULAR | 0 refills | Status: AC

## 2022-05-17 MED ORDER — MELOXICAM 7.5 MG PO TABS: 7.5000 mg | ORAL_TABLET | Freq: Every day | ORAL | 0 refills | Status: DC

## 2022-05-17 MED ORDER — VITAMIN D (ERGOCALCIFEROL) 1.25 MG (50000 UNIT) PO CAPS: 50000.0000 [IU] | ORAL_CAPSULE | ORAL | 0 refills | Status: DC

## 2022-05-17 NOTE — Assessment & Plan Note (Signed)
Previously referred to neurology, had to reschedule appointment.  Was scheduled to see them today to establish, however provider had to cancel appointment.  She will be calling their office back in order to schedule follow-up visit to discuss further management

## 2022-05-17 NOTE — Assessment & Plan Note (Signed)
Routine HCM labs reviewed. HCM reviewed/discussed. Anticipatory guidance regarding healthy weight, lifestyle and choices given. Recommend healthy diet.  Recommend approximately 150 minutes/week of moderate intensity exercise Recommend regular dental and vision exams Always use seatbelt/lap and shoulder restraints Recommend using smoke alarms and checking batteries at least twice a year Recommend using sunscreen when outside Discussed colon cancer screening recommendations, options.  Patient would like to proceed with colonoscopy, referral placed Discussed recommendations for shingles vaccine.  Patient interested in receiving this, prescription sent to pharmacy Discussed tetanus immunization recommendations, patient is up-to-date Order placed for screening mammogram Referral placed for patient to establish with OB/GYN regarding women's health maintenance, to establish care

## 2022-05-17 NOTE — Assessment & Plan Note (Signed)
Vitamin D level notably low on recent labs at 8.5.  We will proceed with vitamin D supplementation at this time with 50,000 units weekly for 8 weeks then transitioning to 1000 units daily.  Plan to recheck vitamin D levels in about 3 months at next appointment

## 2022-05-17 NOTE — Assessment & Plan Note (Signed)
Chronic issue for patient, she has been following with orthopedic surgeon and plan was to proceed with viscosupplementation.  She reports that these injections are not covered by her current insurance, however she will have a new insurance plan beginning in July.  Recommend reaching out to orthopedic surgeons office once new insurance is in place to assess if new insurance will cover the gel injections Given her current symptoms, discussed treatment options.  We will proceed with meloxicam.  Cautioned on potential side effects, recommend avoidance of other OTC anti-inflammatories.  May use Tylenol as needed for breakthrough pain
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
## 2022-05-18 NOTE — Telephone Encounter (Signed)
Called and spoke with pt letting her know the results of HST and she verbalized understanding. Referral to orthodontics has been placed and pt's appt has been cancelled. Nothing further needed.

## 2022-05-31 ENCOUNTER — Ambulatory Visit
Admission: RE | Admit: 2022-05-31 | Discharge: 2022-05-31 | Disposition: A | Discharge status: Home / Self Care | Payer: Commercial Managed Care - HMO | Source: Ambulatory Visit | Attending: Family Medicine | Admitting: Family Medicine

## 2022-05-31 DIAGNOSIS — Z1231 Encounter for screening mammogram for malignant neoplasm of breast: Secondary | ICD-10-CM

## 2022-06-06 ENCOUNTER — Other Ambulatory Visit (HOSPITAL_BASED_OUTPATIENT_CLINIC_OR_DEPARTMENT_OTHER): Payer: Self-pay

## 2022-06-06 DIAGNOSIS — E559 Vitamin D deficiency, unspecified: Secondary | ICD-10-CM

## 2022-06-06 MED ORDER — VITAMIN D (ERGOCALCIFEROL) 1.25 MG (50000 UNIT) PO CAPS: 50000.0000 [IU] | ORAL_CAPSULE | ORAL | 0 refills | Status: DC

## 2022-06-09 ENCOUNTER — Ambulatory Visit: Payer: 59 | Admitting: Primary Care

## 2022-06-13 ENCOUNTER — Other Ambulatory Visit (HOSPITAL_BASED_OUTPATIENT_CLINIC_OR_DEPARTMENT_OTHER): Payer: Self-pay | Admitting: Family Medicine

## 2022-07-14 ENCOUNTER — Other Ambulatory Visit (HOSPITAL_BASED_OUTPATIENT_CLINIC_OR_DEPARTMENT_OTHER): Payer: Self-pay | Admitting: Family Medicine

## 2022-07-29 ENCOUNTER — Emergency Department (HOSPITAL_BASED_OUTPATIENT_CLINIC_OR_DEPARTMENT_OTHER)
Admission: EM | Admit: 2022-07-29 | Discharge: 2022-07-29 | Disposition: A | Discharge status: Home / Self Care | Payer: Commercial Managed Care - HMO | Attending: Emergency Medicine | Admitting: Emergency Medicine

## 2022-07-29 ENCOUNTER — Encounter (HOSPITAL_BASED_OUTPATIENT_CLINIC_OR_DEPARTMENT_OTHER): Payer: Self-pay | Admitting: Emergency Medicine

## 2022-07-29 ENCOUNTER — Other Ambulatory Visit: Payer: Self-pay

## 2022-07-29 DIAGNOSIS — K0889 Other specified disorders of teeth and supporting structures: Secondary | ICD-10-CM | POA: Diagnosis present

## 2022-07-29 MED ORDER — KETOROLAC TROMETHAMINE 15 MG/ML IJ SOLN
15.0000 mg | Freq: Once | INTRAMUSCULAR | Status: AC
  Administered 2022-07-29: 15 mg via INTRAMUSCULAR
  Filled 2022-07-29: qty 1

## 2022-07-29 MED ORDER — PENICILLIN V POTASSIUM 500 MG PO TABS: 500.0000 mg | ORAL_TABLET | Freq: Four times a day (QID) | ORAL | 0 refills | Status: AC

## 2022-07-29 NOTE — ED Provider Notes (Signed)
[facility] EMERGENCY DEPT Provider Note   CSN: [id number] Arrival date & time: 07/29/22  0813     History Chief Complaint  Patient presents with   Dental Pain    HPI Angelica Miller is a 56 y.o. female presenting for right-sided dental pain and swelling.  Patient states that she has a minimal medical history except for poor dentition.  She has multiple fractured teeth.  She denies fevers or chills nausea vomiting syncope shortness of breath.  Is otherwise ambulatory tolerating p.o. intake.  No known sick contacts.  She states that she has been try to follow-up with a dentist but is been lost to follow-up.  She has a fractured tooth that started causing pain and swelling.  The swelling is in her left cheek.  It does not radiate to her submandibular space. Patient is tolerating p.o. intake..   Patient's recorded medical, surgical, social, medication list and allergies were reviewed in the Snapshot window as part of the initial history.   Review of Systems   Review of Systems  Constitutional:  Negative for chills and fever.  HENT:  Positive for dental problem. Negative for ear pain and sore throat.   Eyes:  Negative for pain and visual disturbance.  Respiratory:  Negative for cough and shortness of breath.   Cardiovascular:  Negative for chest pain and palpitations.  Gastrointestinal:  Negative for abdominal pain and vomiting.  Genitourinary:  Negative for dysuria and hematuria.  Musculoskeletal:  Negative for arthralgias and back pain.  Skin:  Negative for color change and rash.  Neurological:  Negative for seizures and syncope.  All other systems reviewed and are negative.   Physical Exam Updated Vital Signs BP (!) 147/81   Pulse 81   Temp 98.7 F (37.1 C)   Resp 20   LMP  (LMP Unknown) Comment: ablation  SpO2 100%  Physical Exam Vitals and nursing note reviewed.  Constitutional:      General: She is not in acute distress.    Appearance: She is  well-developed.  HENT:     Head: Normocephalic and atraumatic.     Mouth/Throat:     Comments: Diffuse advanced dental disease.  No evidence of apical abscess.  No submandibular swelling or tenderness.  No bogginess or fullness.  Mild tenderness to palpation over the left sinus. Eyes:     Conjunctiva/sclera: Conjunctivae normal.  Cardiovascular:     Rate and Rhythm: Normal rate and regular rhythm.     Heart sounds: No murmur heard. Pulmonary:     Effort: Pulmonary effort is normal. No respiratory distress.     Breath sounds: Normal breath sounds.  Abdominal:     General: There is no distension.     Palpations: Abdomen is soft.     Tenderness: There is no abdominal tenderness. There is no right CVA tenderness or left CVA tenderness.  Musculoskeletal:        General: No swelling or tenderness. Normal range of motion.     Cervical back: Neck supple.  Skin:    General: Skin is warm and dry.  Neurological:     General: No focal deficit present.     Mental Status: She is alert and oriented to person, place, and time. Mental status is at baseline.     Cranial Nerves: No cranial nerve deficit.      ED Course/ Medical Decision Making/ A&P    Procedures Procedures   Medications Ordered in ED Medications  ketorolac (TORADOL) 15 MG/ML injection  15 mg (has no administration in time range)    Medical Decision Making:    [name] is a 56 y.o. female who presented to the ED today with dental pain detailed above.     Patient placed on continuous vitals and telemetry monitoring while in ED which was reviewed periodically.   Complete initial physical exam performed, notably the patient  was stable in no acute distress.  Intraoral poor dentition appreciated with multiple fractured teeth.  No acute fractures.      Reviewed and confirmed nursing documentation for past medical history, family history, social history.    Initial Assessment:   Patient's history of present  illness and physical exam findings are most consistent with developing pulpitis secondary to poor dentition.  No evidence of abscess or other acute pathology including RPA, PTA or Ludwick's angina at this time.  Recommended close follow-up with dentist and resources were provided.  Patient's pain treated with Toradol IM in the emergency department and will do short course of penicillin for pulpitis giving patient time to follow-up with dentistry.  Disposition:  Based on the above findings, I believe patient is stable for discharge.    Patient/family educated about specific return precautions for given chief complaint and symptoms.  Patient/family educated about follow-up with PCP and dentistry.     Patient/family expressed understanding of return precautions and need for follow-up. Patient spoken to regarding all imaging and laboratory results and appropriate follow up for these results. All education provided in verbal form with additional information in written form. Time was allowed for answering of patient questions. Patient discharged.    Emergency Department Medication Summary:   Medications  ketorolac (TORADOL) 15 MG/ML injection 15 mg (has no administration in time range)         Clinical Impression:  1. Pain, dental      Discharge   Final Clinical Impression(s) / ED Diagnoses Final diagnoses:  Pain, dental    Rx / DC Orders ED Discharge Orders          Ordered    penicillin v potassium (VEETID) 500 MG tablet  4 times daily        07/29/22 0841              Glyn Ade, MD 07/29/22 [phone]

## 2022-07-29 NOTE — ED Notes (Signed)
Dc instructions reviewed with patient. Patient voiced understanding. Dc with belongings.  °

## 2022-07-29 NOTE — ED Triage Notes (Signed)
Left canine tooth sore 2 days, had pustule on gum. Woke up today and whole side of face is swollen and painful. No fevers

## 2022-07-29 NOTE — Discharge Instructions (Addendum)
You were seen today for dental pain. We did not identify any emergent cause for your symptoms. Your evaluation is most consistent with infection.   Plan and next steps:   The following may be helpful in managing your symptoms:   Pain- Lidocaine Patches  Apply to affected area for up to 12 hours at a time.   Pain/Fever- Adult Tylenol dosing:  650 mg orally every 4 to 6 hours as needed, MAX: 3250 mg/24 hours   (Extra-strength) 1000 mg orally every 6 hours as needed; MAX: 3000 mg/24 hours   Do not use if you have liver disease. Read the label on the bottle.   Pain/Fever- Adult Ibuprofen Dosing  200 to 400 mg orally every 4 to 6 hours as needed; MAX 1200 mg/day; do not take longer than 10 days   Do not use if you have kidney disease. Read the label on the bottle   You have been prescribed penicillin.  Please take 4 times daily for 7 days alongside food.  It is critical you follow-up with a dentist during this timeframe.  Findings:  You may see all of your lab and imaging results utilizing our online portal! Look in this document or ask a team member for your mychart* access information. The most notable results have additionally been verbally communicated with you and your bedside family.    Follow-up Plan:   Follow up with the patient's normal primary care provider for monitoring of this condition within 48 hours.   Signs/Symptoms that would warrant return to the ED:  Please return to the ED if you experience worsening of symptoms or any abrupt changes in your health. Standard of care precautions for your chief complaint have already been verbally communicated with you. Always be on alert for fevers, chills, shortness of breath, chest pains, or sudden changes that warrant immediate evaluation.    Thank you for allowing [place] to be a part of you and your families' care.   Glyn Ade MD

## 2022-08-14 ENCOUNTER — Other Ambulatory Visit (HOSPITAL_BASED_OUTPATIENT_CLINIC_OR_DEPARTMENT_OTHER): Payer: Self-pay | Admitting: Family Medicine

## 2022-08-16 ENCOUNTER — Ambulatory Visit (HOSPITAL_BASED_OUTPATIENT_CLINIC_OR_DEPARTMENT_OTHER): Payer: 59 | Admitting: Family Medicine

## 2022-08-17 ENCOUNTER — Ambulatory Visit (HOSPITAL_BASED_OUTPATIENT_CLINIC_OR_DEPARTMENT_OTHER): Payer: 59 | Admitting: Family Medicine

## 2022-09-05 ENCOUNTER — Ambulatory Visit (INDEPENDENT_AMBULATORY_CARE_PROVIDER_SITE_OTHER): Payer: Commercial Managed Care - HMO | Admitting: Advanced Practice Midwife

## 2022-09-05 ENCOUNTER — Ambulatory Visit (INDEPENDENT_AMBULATORY_CARE_PROVIDER_SITE_OTHER): Payer: Commercial Managed Care - HMO | Admitting: Family Medicine

## 2022-09-05 ENCOUNTER — Encounter: Payer: Self-pay | Admitting: Gastroenterology

## 2022-09-05 ENCOUNTER — Other Ambulatory Visit (HOSPITAL_COMMUNITY)
Admission: RE | Admit: 2022-09-05 | Discharge: 2022-09-05 | Disposition: A | Discharge status: Home / Self Care | Payer: Commercial Managed Care - HMO | Source: Ambulatory Visit | Attending: Advanced Practice Midwife | Admitting: Advanced Practice Midwife

## 2022-09-05 ENCOUNTER — Encounter (HOSPITAL_BASED_OUTPATIENT_CLINIC_OR_DEPARTMENT_OTHER): Payer: Self-pay | Admitting: Advanced Practice Midwife

## 2022-09-05 ENCOUNTER — Encounter (HOSPITAL_BASED_OUTPATIENT_CLINIC_OR_DEPARTMENT_OTHER): Payer: Self-pay | Admitting: Family Medicine

## 2022-09-05 VITALS — BP 144/69 | HR 71 | Wt 165.0 lb

## 2022-09-05 VITALS — BP 165/78 | HR 68 | Ht 62.0 in | Wt 165.0 lb

## 2022-09-05 DIAGNOSIS — F17290 Nicotine dependence, other tobacco product, uncomplicated: Secondary | ICD-10-CM | POA: Diagnosis not present

## 2022-09-05 DIAGNOSIS — Z1211 Encounter for screening for malignant neoplasm of colon: Secondary | ICD-10-CM

## 2022-09-05 DIAGNOSIS — Z01419 Encounter for gynecological examination (general) (routine) without abnormal findings: Secondary | ICD-10-CM | POA: Diagnosis not present

## 2022-09-05 DIAGNOSIS — Z124 Encounter for screening for malignant neoplasm of cervix: Secondary | ICD-10-CM

## 2022-09-05 DIAGNOSIS — Z23 Encounter for immunization: Secondary | ICD-10-CM | POA: Diagnosis not present

## 2022-09-05 DIAGNOSIS — E559 Vitamin D deficiency, unspecified: Secondary | ICD-10-CM | POA: Diagnosis not present

## 2022-09-05 DIAGNOSIS — A5901 Trichomonal vulvovaginitis: Secondary | ICD-10-CM

## 2022-09-05 DIAGNOSIS — F4321 Adjustment disorder with depressed mood: Secondary | ICD-10-CM

## 2022-09-05 NOTE — Progress Notes (Signed)
Subjective:     Angelica Miller is a 56 y.o. female here at [facility] for a routine exam.  Current complaints: none.  Personal health questionnaire reviewed: yes.  Do you have a primary care provider? yes Do you feel safe at home? yes  [place] Visit from 05/17/2022 in [place] and Sports Medicine  PHQ-2 Total Score 1       Health Maintenance Due  Topic Date Due   HIV Screening  Never done   Hepatitis C Screening  Never done   COLONOSCOPY (Pts 45-3yrs Insurance coverage will need to be confirmed)  Never done   Zoster Vaccines- Shingrix (1 of 2) Never done   PAP SMEAR-Modifier  02/28/2020   INFLUENZA VACCINE  06/20/2022     Risk factors for chronic health problems: Smoking: Alchohol/how much: Pt BMI: Body mass index is 30.18 kg/m.   Gynecologic History No LMP recorded. Patient has had an ablation. Contraception: post menopausal status Last Pap: unknown, 2019?Marland Kitchen Results were: normal per pt Last mammogram: 05/2022. Results were: normal  Obstetric History OB History  Gravida Para Term Preterm AB Living  5       2 3   SAB IAB Ectopic Multiple Live Births  2       3    # Outcome Date GA Lbr Len/2nd Weight Sex Delivery Anes PTL Lv  5 Gravida           4 Gravida           3 Gravida           2 SAB           1 SAB              The following portions of the patient's history were reviewed and updated as appropriate: allergies, current medications, past family history, past medical history, past social history, past surgical history, and problem list.  Review of Systems Pertinent items noted in HPI and remainder of comprehensive ROS otherwise negative.    Objective:   BP (!) 144/69   Pulse 71   Wt 165 lb (74.8 kg)   BMI 30.18 kg/m  VS reviewed, nursing note reviewed,  Constitutional: well developed, well nourished, no distress HEENT: normocephalic CV: normal rate Pulm/chest wall: normal effort Breast Exam:  exam performed: right breast normal without mass, skin or nipple changes or axillary nodes, left breast normal without mass, skin or nipple changes or axillary nodes Abdomen: soft Neuro: alert and oriented x 3 Skin: warm, dry Psych: affect normal Pelvic exam: Performed: Cervix pink, visually closed, without lesion, scant white creamy discharge, vaginal walls and external genitalia normal Bimanual exam: Cervix 0/long/high, firm, anterior, neg CMT, uterus nontender, nonenlarged, adnexa without tenderness, enlargement, or mass       Assessment/Plan:   1. Well woman exam with routine gynecological exam --No gyn problems, last menses in her 33s, had uterine ablation and has not had bleeding since.  No vasomotor or other menopausal symptoms.   2. Encounter for screening for cervical cancer  - Cytology - PAP( [place])  3. Screening for colon cancer --Never had colonoscopy - Ambulatory referral to Gastroenterology  4. Cigar smoker --Has cut way back and is interested in quitting.  Offered smoking cessation referral, pt declines at this time.    5. Situational depression --Recent losses of family members. Pt has episodes of depression related to loss.   --Offered IBH and pt would like to speak with  counselor - Ambulatory referral to Integrated Behavioral Health     Return in about 1 year (around 09/06/2023) for annual exam.   Sharen Counter, CNM 2:58 PM

## 2022-09-05 NOTE — Progress Notes (Signed)
    Procedures performed today:    None.  Independent interpretation of notes and tests performed by another provider:   None.  Brief History, Exam, Impression, and Recommendations:    BP (!) 165/78   Pulse 68   Ht 5\' 2"  (1.575 m)   Wt 165 lb (74.8 kg)   SpO2 98%   BMI 30.18 kg/m   Vitamin D deficiency Vitamin D was notably low a few months ago at 8.5.  She did take once weekly 50,000 unit dose of vitamin D for about 2 months.  Since completing this, she has not taken any additional vitamin D supplementation.  We will check vitamin D level today.  Discussed that if it is still low, would resume vitamin D supplementation, dosing depending on degree of deficiency.  If vitamin D is within normal range, then may start low-dose of OTC vitamin D supplement.  Return in about 6 months (around 03/07/2023) for follow-up.   ___________________________________________ Fabrice Dyal de [place], MD, ABFM, [facility] Primary Care and [place]

## 2022-09-05 NOTE — Assessment & Plan Note (Signed)
Vitamin D was notably low a few months ago at 8.5.  She did take once weekly 50,000 unit dose of vitamin D for about 2 months.  Since completing this, she has not taken any additional vitamin D supplementation.  We will check vitamin D level today.  Discussed that if it is still low, would resume vitamin D supplementation, dosing depending on degree of deficiency.  If vitamin D is within normal range, then may start low-dose of OTC vitamin D supplement.

## 2022-09-05 NOTE — Patient Instructions (Signed)
  Medication Instructions:  Your physician recommends that you continue on your current medications as directed. Please refer to the Current Medication list given to you today. --If you need a refill on any your medications before your next appointment, please call your pharmacy first. If no refills are authorized on file call the office.-- Lab Work: Your physician has recommended that you have lab work today: Yes If you have labs (blood work) drawn today and your tests are completely normal, you will receive your results via MyChart message OR a phone call from our staff.  Please ensure you check your voicemail in the event that you authorized detailed messages to be left on a delegated number. If you have any lab test that is abnormal or we need to change your treatment, we will call you to review the results.  Referrals/Procedures/Imaging: No  Follow-Up: Your next appointment:   Your physician recommends that you schedule a follow-up appointment in: 4-6 months with Dr. de [place].  You will receive a text message or e-mail with a link to a survey about your care and experience with us today! We would greatly appreciate your feedback!   Thanks for letting us be apart of your health journey!!  Primary Care and Sports Medicine   Dr. Raymond de [place]   We encourage you to activate your patient portal called "MyChart".  Sign up information is provided on this After Visit Summary.  MyChart is used to connect with patients for Virtual Visits (Telemedicine).  Patients are able to view lab/test results, encounter notes, upcoming appointments, etc.  Non-urgent messages can be sent to your provider as well. To learn more about what you can do with MyChart, please visit --  [email or web address].    

## 2022-09-06 LAB — VITAMIN D 25 HYDROXY (VIT D DEFICIENCY, FRACTURES): Vit D, 25-Hydroxy: 32.9 ng/mL (ref 30.0–100.0)

## 2022-09-06 NOTE — Addendum Note (Signed)
Addended by: Lowella Bandy on: 09/06/2022 08:53 AM   Modules accepted: Orders

## 2022-09-07 LAB — CYTOLOGY - PAP
Comment: NEGATIVE
Diagnosis: NEGATIVE
High risk HPV: NEGATIVE

## 2022-09-07 MED ORDER — METRONIDAZOLE 500 MG PO TABS: 500.0000 mg | ORAL_TABLET | Freq: Two times a day (BID) | ORAL | 0 refills | Status: AC

## 2022-09-07 NOTE — Addendum Note (Signed)
Addended by: Fatima Blank A on: 09/07/2022 02:00 PM   Modules accepted: Orders

## 2022-09-12 ENCOUNTER — Ambulatory Visit: Payer: Commercial Managed Care - HMO | Admitting: Licensed Clinical Social Worker

## 2022-09-12 NOTE — BH Specialist Note (Signed)
No show

## 2022-09-21 ENCOUNTER — Encounter (HOSPITAL_BASED_OUTPATIENT_CLINIC_OR_DEPARTMENT_OTHER): Payer: Self-pay

## 2022-09-22 ENCOUNTER — Ambulatory Visit (AMBULATORY_SURGERY_CENTER): Payer: Self-pay

## 2022-09-22 VITALS — Ht 62.0 in | Wt 167.0 lb

## 2022-09-22 DIAGNOSIS — Z1211 Encounter for screening for malignant neoplasm of colon: Secondary | ICD-10-CM

## 2022-09-22 MED ORDER — PLENVU 140 G PO SOLR: 1.0000 | ORAL | Status: DC

## 2022-09-22 NOTE — Progress Notes (Signed)

## 2022-10-09 ENCOUNTER — Ambulatory Visit (INDEPENDENT_AMBULATORY_CARE_PROVIDER_SITE_OTHER): Payer: Commercial Managed Care - HMO | Admitting: *Deleted

## 2022-10-09 ENCOUNTER — Encounter: Payer: Self-pay | Admitting: Gastroenterology

## 2022-10-09 ENCOUNTER — Other Ambulatory Visit (HOSPITAL_COMMUNITY)
Admission: RE | Admit: 2022-10-09 | Discharge: 2022-10-09 | Disposition: A | Discharge status: Home / Self Care | Payer: Commercial Managed Care - HMO | Source: Ambulatory Visit | Attending: Obstetrics & Gynecology | Admitting: Obstetrics & Gynecology

## 2022-10-09 DIAGNOSIS — N898 Other specified noninflammatory disorders of vagina: Secondary | ICD-10-CM | POA: Insufficient documentation

## 2022-10-09 DIAGNOSIS — Z8619 Personal history of other infectious and parasitic diseases: Secondary | ICD-10-CM

## 2022-10-09 DIAGNOSIS — Z113 Encounter for screening for infections with a predominantly sexual mode of transmission: Secondary | ICD-10-CM

## 2022-10-09 NOTE — Progress Notes (Signed)
Pt here for repeat aptima test after completing treatment for trich. Pt complains of some discharge and irritation and requests testing for yeast/BV as well.

## 2022-10-10 LAB — CERVICOVAGINAL ANCILLARY ONLY
Bacterial Vaginitis (gardnerella): POSITIVE — AB
Candida Glabrata: NEGATIVE
Candida Vaginitis: NEGATIVE
Chlamydia: NEGATIVE
Comment: NEGATIVE
Comment: NEGATIVE
Comment: NEGATIVE
Comment: NEGATIVE
Comment: NEGATIVE
Comment: NORMAL
Neisseria Gonorrhea: NEGATIVE
Trichomonas: NEGATIVE

## 2022-10-11 LAB — HEP, RPR, HIV PANEL
HIV Screen 4th Generation wRfx: NONREACTIVE
Hepatitis B Surface Ag: NEGATIVE
RPR Ser Ql: NONREACTIVE

## 2022-10-11 LAB — HEPATITIS C ANTIBODY: Hep C Virus Ab: NONREACTIVE

## 2022-10-16 ENCOUNTER — Encounter (HOSPITAL_BASED_OUTPATIENT_CLINIC_OR_DEPARTMENT_OTHER): Payer: Self-pay | Admitting: Family Medicine

## 2022-10-16 ENCOUNTER — Other Ambulatory Visit: Payer: Self-pay

## 2022-10-16 DIAGNOSIS — Z1211 Encounter for screening for malignant neoplasm of colon: Secondary | ICD-10-CM

## 2022-10-16 MED ORDER — PLENVU 140 G PO SOLR: 1.0000 | ORAL | Status: DC

## 2022-10-16 MED ORDER — PLENVU 140 G PO SOLR: 1.0000 | Freq: Once | ORAL | 0 refills | Status: AC

## 2022-10-17 ENCOUNTER — Encounter (HOSPITAL_BASED_OUTPATIENT_CLINIC_OR_DEPARTMENT_OTHER): Payer: Self-pay | Admitting: Emergency Medicine

## 2022-10-17 ENCOUNTER — Emergency Department (HOSPITAL_BASED_OUTPATIENT_CLINIC_OR_DEPARTMENT_OTHER): Payer: Commercial Managed Care - HMO

## 2022-10-17 ENCOUNTER — Other Ambulatory Visit: Payer: Self-pay

## 2022-10-17 ENCOUNTER — Emergency Department (HOSPITAL_BASED_OUTPATIENT_CLINIC_OR_DEPARTMENT_OTHER): Payer: Commercial Managed Care - HMO | Admitting: Radiology

## 2022-10-17 ENCOUNTER — Emergency Department (HOSPITAL_BASED_OUTPATIENT_CLINIC_OR_DEPARTMENT_OTHER)
Admission: EM | Admit: 2022-10-17 | Discharge: 2022-10-17 | Disposition: A | Discharge status: Home / Self Care | Payer: Commercial Managed Care - HMO | Attending: Emergency Medicine | Admitting: Emergency Medicine

## 2022-10-17 DIAGNOSIS — I10 Essential (primary) hypertension: Secondary | ICD-10-CM | POA: Diagnosis not present

## 2022-10-17 DIAGNOSIS — J45909 Unspecified asthma, uncomplicated: Secondary | ICD-10-CM | POA: Diagnosis not present

## 2022-10-17 DIAGNOSIS — R519 Headache, unspecified: Secondary | ICD-10-CM | POA: Diagnosis present

## 2022-10-17 DIAGNOSIS — Z7951 Long term (current) use of inhaled steroids: Secondary | ICD-10-CM | POA: Insufficient documentation

## 2022-10-17 DIAGNOSIS — I160 Hypertensive urgency: Secondary | ICD-10-CM | POA: Diagnosis not present

## 2022-10-17 LAB — BASIC METABOLIC PANEL
Anion gap: 8 (ref 5–15)
BUN: 13 mg/dL (ref 6–20)
CO2: 24 mmol/L (ref 22–32)
Calcium: 9 mg/dL (ref 8.9–10.3)
Chloride: 107 mmol/L (ref 98–111)
Creatinine, Ser: 0.63 mg/dL (ref 0.44–1.00)
GFR, Estimated: >60 mL/min (ref >60)
Glucose, Bld: 89 mg/dL (ref 70–99)
Potassium: 3.7 mmol/L (ref 3.5–5.1)
Sodium: 139 mmol/L (ref 135–145)

## 2022-10-17 LAB — CBC
HCT: 39.5 % (ref 36.0–46.0)
Hemoglobin: 13.4 g/dL (ref 12.0–15.0)
MCH: 31.1 pg (ref 26.0–34.0)
MCHC: 33.9 g/dL (ref 30.0–36.0)
MCV: 91.6 fL (ref 80.0–100.0)
Platelets: 258 10*3/uL (ref 150–400)
RBC: 4.31 MIL/uL (ref 3.87–5.11)
RDW: 13.2 % (ref 11.5–15.5)
WBC: 6.8 10*3/uL (ref 4.0–10.5)
nRBC: 0 % (ref 0.0–0.2)

## 2022-10-17 LAB — TROPONIN I (HIGH SENSITIVITY): Troponin I (High Sensitivity): 5 ng/L (ref <18)
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
## 2022-10-17 NOTE — ED Triage Notes (Signed)
Pt arrives to ED via [facility] with c/o HTN. No hx of same.

## 2022-10-17 NOTE — Discharge Instructions (Addendum)
Your workup was reassuring today.  I recommend close follow-up with PCP for reevaluation of blood pressure and further management.  Please do not hesitate to return to emergency department if worrisome signs symptoms we discussed become apparent.

## 2022-10-17 NOTE — ED Notes (Signed)
RN provided AVS using Teachback Method. Patient verbalizes understanding of Discharge Instructions. Opportunity for Questioning and Answers were provided by RN. Patient Discharged from ED ambulatory to Home. ° °

## 2022-10-18 ENCOUNTER — Other Ambulatory Visit (HOSPITAL_BASED_OUTPATIENT_CLINIC_OR_DEPARTMENT_OTHER): Payer: Self-pay | Admitting: *Deleted

## 2022-10-18 ENCOUNTER — Ambulatory Visit (INDEPENDENT_AMBULATORY_CARE_PROVIDER_SITE_OTHER): Payer: Commercial Managed Care - HMO | Admitting: Family Medicine

## 2022-10-18 ENCOUNTER — Encounter (HOSPITAL_BASED_OUTPATIENT_CLINIC_OR_DEPARTMENT_OTHER): Payer: Self-pay | Admitting: Family Medicine

## 2022-10-18 ENCOUNTER — Encounter (HOSPITAL_BASED_OUTPATIENT_CLINIC_OR_DEPARTMENT_OTHER): Payer: Self-pay | Admitting: *Deleted

## 2022-10-18 ENCOUNTER — Encounter: Payer: Self-pay | Admitting: Gastroenterology

## 2022-10-18 VITALS — BP 119/80 | HR 70 | Temp 97.9°F | Ht 62.0 in | Wt 167.1 lb

## 2022-10-18 DIAGNOSIS — R030 Elevated blood-pressure reading, without diagnosis of hypertension: Secondary | ICD-10-CM

## 2022-10-18 MED ORDER — METRONIDAZOLE 500 MG PO TABS: 500.0000 mg | ORAL_TABLET | Freq: Two times a day (BID) | ORAL | 0 refills | Status: DC

## 2022-10-18 NOTE — Patient Instructions (Signed)
  Medication Instructions:  Your physician recommends that you continue on your current medications as directed. Please refer to the Current Medication list given to you today. --If you need a refill on any your medications before your next appointment, please call your pharmacy first. If no refills are authorized on file call the office.-- Lab Work: Your physician has recommended that you have lab work today: No If you have labs (blood work) drawn today and your tests are completely normal, you will receive your results via MyChart message OR a phone call from our staff.  Please ensure you check your voicemail in the event that you authorized detailed messages to be left on a delegated number. If you have any lab test that is abnormal or we need to change your treatment, we will call you to review the results.  Referrals/Procedures/Imaging: No  Follow-Up: Your next appointment:   Your physician recommends that you schedule a follow-up appointment in: 3 weeks blood pressure check with Dr. de [place].  You will receive a text message or e-mail with a link to a survey about your care and experience with [place] today! We would greatly appreciate your feedback!   Thanks for letting us be apart of your health journey!!  Primary Care and Sports Medicine   Dr. Ceasar Mons [place]   We encourage you to activate your patient portal called "MyChart".  Sign up information is provided on this After Visit Summary.  MyChart is used to connect with patients for Virtual Visits (Telemedicine).  Patients are able to view lab/test results, encounter notes, upcoming appointments, etc.  Non-urgent messages can be sent to your provider as well. To learn more about what you can do with MyChart, please visit --  [email or web address].

## 2022-10-18 NOTE — Progress Notes (Signed)
    Procedures performed today:    None.  Independent interpretation of notes and tests performed by another provider:   None.  Brief History, Exam, Impression, and Recommendations:    BP 119/80 (BP Location: Left Arm, Patient Position: Sitting, Cuff Size: Large)   Pulse 70   Temp 97.9 F (36.6 C) (Oral)   Ht 5\' 2"  (1.575 m)   Wt 167 lb 1.7 oz (75.8 kg)   SpO2 100%   BMI 30.56 kg/m   Elevated blood-pressure reading without diagnosis of hypertension Patient reports that she was recently checking her blood pressure at home and noted it to be elevated.  This was initially being done as she was having increased headaches at the time. Home blood pressure cuff is a wrist cuff.  She indicates that some of her home blood pressure readings have ranged between 1 40-180 in regards to systolic readings.  She did present to emergency department related to these blood pressure readings.  At time of emergency department evaluation systolic blood pressure was in the 150s.  They did complete initial labs and imaging in the ED and did not find any abnormalities at that time.  Due to this, she was discharged with recommendation for follow-up with in the office here.  She continues to check her blood pressure intermittently.  She has not previously been diagnosed with hypertension.  She does have family history of high blood pressure.  She denies any current issues with chest pain or headaches today in the office.  No changes in vision. On exam today, patient is in no acute distress, vital signs stable.  Her blood pressure today is appropriate at 119/80, normal heart rate. We discussed considerations given that blood pressure is normal in the office today.  Reviewed lifestyle modifications including DASH diet, regular aerobic exercise.  Will have patient return with her home blood pressure cuff for nurse visit next week in order to compare reading with her home cuff to our cuff here in the office.  Will  plan for close follow-up office visit in about 3 weeks for continued monitoring  Return in about 3 weeks (around 11/08/2022).   ___________________________________________ Sherese Heyward de 11/10/2022, MD, ABFM, CAQSM Primary Care and Sports Medicine [facility]

## 2022-10-18 NOTE — Assessment & Plan Note (Signed)
Patient reports that she was recently checking her blood pressure at home and noted it to be elevated.  This was initially being done as she was having increased headaches at the time. Home blood pressure cuff is a wrist cuff.  She indicates that some of her home blood pressure readings have ranged between 1 40-180 in regards to systolic readings.  She did present to emergency department related to these blood pressure readings.  At time of emergency department evaluation systolic blood pressure was in the 150s.  They did complete initial labs and imaging in the ED and did not find any abnormalities at that time.  Due to this, she was discharged with recommendation for follow-up with [place] in the office here.  She continues to check her blood pressure intermittently.  She has not previously been diagnosed with hypertension.  She does have family history of high blood pressure.  She denies any current issues with chest pain or headaches today in the office.  No changes in vision. On exam today, patient is in no acute distress, vital signs stable.  Her blood pressure today is appropriate at 119/80, normal heart rate. We discussed considerations given that blood pressure is normal in the office today.  Reviewed lifestyle modifications including DASH diet, regular aerobic exercise.  Will have patient return with her home blood pressure cuff for nurse visit next week in order to compare reading with her home cuff to our cuff here in the office.  Will plan for close follow-up office visit in about 3 weeks for continued monitoring

## 2022-10-20 ENCOUNTER — Encounter: Payer: Commercial Managed Care - HMO | Admitting: Gastroenterology

## 2022-10-23 ENCOUNTER — Ambulatory Visit (HOSPITAL_COMMUNITY)
Admission: EM | Admit: 2022-10-23 | Discharge: 2022-10-23 | Disposition: A | Discharge status: Home / Self Care | Payer: Commercial Managed Care - HMO | Attending: Internal Medicine | Admitting: Internal Medicine

## 2022-10-23 ENCOUNTER — Encounter (HOSPITAL_COMMUNITY): Payer: Self-pay

## 2022-10-23 DIAGNOSIS — Z1152 Encounter for screening for COVID-19: Secondary | ICD-10-CM | POA: Diagnosis not present

## 2022-10-23 DIAGNOSIS — F17200 Nicotine dependence, unspecified, uncomplicated: Secondary | ICD-10-CM | POA: Insufficient documentation

## 2022-10-23 DIAGNOSIS — J029 Acute pharyngitis, unspecified: Secondary | ICD-10-CM

## 2022-10-23 DIAGNOSIS — J069 Acute upper respiratory infection, unspecified: Secondary | ICD-10-CM | POA: Diagnosis present

## 2022-10-23 DIAGNOSIS — R0981 Nasal congestion: Secondary | ICD-10-CM | POA: Diagnosis present

## 2022-10-23 DIAGNOSIS — Z79899 Other long term (current) drug therapy: Secondary | ICD-10-CM | POA: Diagnosis not present

## 2022-10-23 MED ORDER — BENZONATATE 100 MG PO CAPS: 100.0000 mg | ORAL_CAPSULE | Freq: Three times a day (TID) | ORAL | 0 refills | Status: DC

## 2022-10-23 MED ORDER — ACETAMINOPHEN 325 MG PO TABS: 975.0000 mg | ORAL_TABLET | Freq: Once | ORAL | Status: DC
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
## 2022-10-23 NOTE — Discharge Instructions (Signed)
You have a viral upper respiratory infection.  COVID-19 testing is pending. We will call you with results if positive. If your COVID test is positive, you must stay at home until day 6 of symptoms. On day 6, you may go out into public and go back to work, but you must wear a mask until day 11 of symptoms to prevent spread to others.  Take guaifenesin 1200mg  every 12 hours to thin your mucous so that you can get it out of your body easier with coughing/blowing your nose. Drink plenty of water while taking this medication so that it works well in your body (at least 8 cups a day).   Take tessalon pearles every 8 hours as needed for cough.  You may take tylenol 1,000mg and ibuprofen 600mg every 6 hours with food as needed for fever/chills, sore throat, aches/pains, and inflammation associated with viral illness. Take this with food to avoid stomach upset.    You may do salt water and baking soda gargles every 4 hours as needed for your throat pain.  Please put 1 teaspoon of salt and 1/2 teaspoon of baking soda in 8 ounces of warm water then gargle and spit the water out. You may also put 1 tablespoon of honey in warm water and drink this to soothe your throat.  Place a humidifier in your room at night to help decrease dry air that can irritate your airway and cause you to have a sore throat and cough.  Please try to eat a well-balanced diet while you are sick so that your body gets proper nutrition to heal.  If you develop any new or worsening symptoms, please return.  If your symptoms are severe, please go to the emergency room.  Follow-up with your primary care provider for further evaluation and management of your symptoms as well as ongoing wellness visits.  I hope you feel better!  

## 2022-10-23 NOTE — ED Triage Notes (Signed)
Pt is here for nasal congestion, nasal drainage, cough, headache, bilateral ear pain , sore throat  and elevated b/p x 3days

## 2022-10-24 LAB — SARS CORONAVIRUS 2 (TAT 6-24 HRS): SARS Coronavirus 2: NEGATIVE

## 2022-10-25 ENCOUNTER — Ambulatory Visit (HOSPITAL_BASED_OUTPATIENT_CLINIC_OR_DEPARTMENT_OTHER): Payer: Commercial Managed Care - HMO

## 2022-11-03 ENCOUNTER — Encounter (HOSPITAL_COMMUNITY): Payer: Self-pay

## 2022-11-03 ENCOUNTER — Ambulatory Visit (HOSPITAL_COMMUNITY)
Admission: EM | Admit: 2022-11-03 | Discharge: 2022-11-03 | Disposition: A | Discharge status: Home / Self Care | Payer: Commercial Managed Care - HMO | Attending: Physician Assistant | Admitting: Physician Assistant

## 2022-11-03 DIAGNOSIS — M1712 Unilateral primary osteoarthritis, left knee: Secondary | ICD-10-CM | POA: Diagnosis not present

## 2022-11-03 DIAGNOSIS — M25561 Pain in right knee: Secondary | ICD-10-CM

## 2022-11-03 DIAGNOSIS — M1711 Unilateral primary osteoarthritis, right knee: Secondary | ICD-10-CM

## 2022-11-03 DIAGNOSIS — M25562 Pain in left knee: Secondary | ICD-10-CM | POA: Diagnosis not present

## 2022-11-03 DIAGNOSIS — G8929 Other chronic pain: Secondary | ICD-10-CM | POA: Diagnosis not present

## 2022-11-03 MED ORDER — MELOXICAM 7.5 MG PO TABS: 7.5000 mg | ORAL_TABLET | Freq: Every day | ORAL | 0 refills | Status: DC

## 2022-11-03 MED ORDER — LIDOCAINE 4 % EX PTCH: 1.0000 | MEDICATED_PATCH | CUTANEOUS | 0 refills | Status: DC

## 2022-11-03 NOTE — ED Triage Notes (Signed)
Pt presents with c/o arthritis flare in bilateral knees
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
## 2022-11-08 ENCOUNTER — Encounter (HOSPITAL_BASED_OUTPATIENT_CLINIC_OR_DEPARTMENT_OTHER): Payer: Self-pay | Admitting: Family Medicine

## 2022-11-08 ENCOUNTER — Ambulatory Visit (HOSPITAL_BASED_OUTPATIENT_CLINIC_OR_DEPARTMENT_OTHER): Payer: Commercial Managed Care - HMO | Admitting: Family Medicine

## 2022-11-08 ENCOUNTER — Ambulatory Visit (INDEPENDENT_AMBULATORY_CARE_PROVIDER_SITE_OTHER): Payer: Commercial Managed Care - HMO | Admitting: Family Medicine

## 2022-11-08 VITALS — BP 110/76 | HR 72 | Ht 62.0 in | Wt 165.5 lb

## 2022-11-08 DIAGNOSIS — J019 Acute sinusitis, unspecified: Secondary | ICD-10-CM | POA: Diagnosis not present

## 2022-11-08 DIAGNOSIS — J329 Chronic sinusitis, unspecified: Secondary | ICD-10-CM | POA: Insufficient documentation

## 2022-11-08 MED ORDER — BENZONATATE 200 MG PO CAPS: 200.0000 mg | ORAL_CAPSULE | Freq: Three times a day (TID) | ORAL | 0 refills | Status: DC | PRN

## 2022-11-08 MED ORDER — AMOXICILLIN 875 MG PO TABS: 875.0000 mg | ORAL_TABLET | Freq: Two times a day (BID) | ORAL | 0 refills | Status: AC

## 2022-11-08 NOTE — Progress Notes (Signed)
    Procedures performed today:    None.  Independent interpretation of notes and tests performed by another provider:   None.  Brief History, Exam, Impression, and Recommendations:    BP 110/76 (BP Location: Left Arm, Patient Position: Sitting, Cuff Size: Large)   Pulse 72   Ht 5\' 2"  (1.575 m)   Wt 165 lb 8 oz (75.1 kg)   SpO2 100%   BMI 30.27 kg/m   Sinusitis Patient presents for evaluation of sore throat, cough, sinus congestion, headaches.  She was seen at urgent care earlier this month with similar symptoms, symptoms have been going on for about 3 days at that point.  Symptoms have now been present for about 3 weeks for patient.  She has not had any significant shortness of breath.  No recent fevers.  At time of symptom onset at urgent care, did have testing for coronavirus which was negative.  She has been using OTC medications to help with symptoms.  She does continue with tobacco use, smokes Black and milds, has not smoked during the past week. On exam, patient is in no acute distress, vital signs stable, patient is afebrile.  Cardiovascular exam with regular rate and rhythm, lungs clear to auscultation bilaterally.  Mild pharyngeal erythema, no tonsillar exudate.  No significant cervical lymphadenopathy.  Mild tenderness to palpation through frontal and maxillary sinuses bilaterally. Suspect the patient had initial acute viral infection which has transition to superimposed bacterial sinusitis.  Given continued symptoms, feel that proceeding with antibiotic therapy would be reasonable and can allow for more rapid improvement in symptoms.  Cautioned on potential side effects related to antibiotic use Recommend continuing with OTC medications, prescription sent to pharmacy for Tessalon Perles to help with cough Note provided to remain out of work today and tomorrow and then returning to work later this week.  She initially wanted a note to remain out of work until next Tuesday, however  discussed that she is unlikely to require remaining out of work for a full week related to current illness  Return in about 3 weeks (around 11/29/2022), or if symptoms worsen or fail to improve, for BP check.   ___________________________________________ Beatryce Colombo de 01/28/2023, MD, ABFM, CAQSM Primary Care and Sports Medicine [facility]

## 2022-11-08 NOTE — Assessment & Plan Note (Signed)
Patient presents for evaluation of sore throat, cough, sinus congestion, headaches.  She was seen at urgent care earlier this month with similar symptoms, symptoms have been going on for about 3 days at that point.  Symptoms have now been present for about 3 weeks for patient.  She has not had any significant shortness of breath.  No recent fevers.  At time of symptom onset at urgent care, did have testing for coronavirus which was negative.  She has been using OTC medications to help with symptoms.  She does continue with tobacco use, smokes Black and milds, has not smoked during the past week. On exam, patient is in no acute distress, vital signs stable, patient is afebrile.  Cardiovascular exam with regular rate and rhythm, lungs clear to auscultation bilaterally.  Mild pharyngeal erythema, no tonsillar exudate.  No significant cervical lymphadenopathy.  Mild tenderness to palpation through frontal and maxillary sinuses bilaterally. Suspect the patient had initial acute viral infection which has transition to superimposed bacterial sinusitis.  Given continued symptoms, feel that proceeding with antibiotic therapy would be reasonable and can allow for more rapid improvement in symptoms.  Cautioned on potential side effects related to antibiotic use Recommend continuing with OTC medications, prescription sent to pharmacy for Tessalon Perles to help with cough Note provided to remain out of work today and tomorrow and then returning to work later this week.  She initially wanted a note to remain out of work until next Tuesday, however discussed that she is unlikely to require remaining out of work for a full week related to current illness

## 2022-11-30 ENCOUNTER — Ambulatory Visit (HOSPITAL_BASED_OUTPATIENT_CLINIC_OR_DEPARTMENT_OTHER): Payer: Commercial Managed Care - HMO | Admitting: Family Medicine

## 2022-12-14 ENCOUNTER — Encounter: Payer: Commercial Managed Care - HMO | Admitting: Gastroenterology

## 2023-01-09 ENCOUNTER — Emergency Department (HOSPITAL_COMMUNITY): Payer: Medicaid Other

## 2023-01-09 ENCOUNTER — Emergency Department (HOSPITAL_COMMUNITY)
Admission: EM | Admit: 2023-01-09 | Discharge: 2023-01-09 | Disposition: A | Discharge status: Home / Self Care | Payer: Medicaid Other | Attending: Emergency Medicine | Admitting: Emergency Medicine

## 2023-01-09 ENCOUNTER — Other Ambulatory Visit: Payer: Self-pay

## 2023-01-09 ENCOUNTER — Encounter (HOSPITAL_COMMUNITY): Payer: Self-pay

## 2023-01-09 DIAGNOSIS — M1710 Unilateral primary osteoarthritis, unspecified knee: Secondary | ICD-10-CM

## 2023-01-09 DIAGNOSIS — G8929 Other chronic pain: Secondary | ICD-10-CM

## 2023-01-09 DIAGNOSIS — I10 Essential (primary) hypertension: Secondary | ICD-10-CM | POA: Insufficient documentation

## 2023-01-09 DIAGNOSIS — M25562 Pain in left knee: Secondary | ICD-10-CM | POA: Insufficient documentation

## 2023-01-09 DIAGNOSIS — M25462 Effusion, left knee: Secondary | ICD-10-CM | POA: Insufficient documentation

## 2023-01-09 DIAGNOSIS — M25461 Effusion, right knee: Secondary | ICD-10-CM | POA: Insufficient documentation

## 2023-01-09 DIAGNOSIS — M25561 Pain in right knee: Secondary | ICD-10-CM | POA: Insufficient documentation

## 2023-01-09 MED ORDER — LIDOCAINE 5 % EX PTCH
2.0000 | MEDICATED_PATCH | CUTANEOUS | Status: DC
  Administered 2023-01-09: 2 via TRANSDERMAL
  Filled 2023-01-09: qty 2

## 2023-01-09 MED ORDER — KETOROLAC TROMETHAMINE 60 MG/2ML IM SOLN
30.0000 mg | Freq: Once | INTRAMUSCULAR | Status: AC
  Administered 2023-01-09: 30 mg via INTRAMUSCULAR
  Filled 2023-01-09: qty 2

## 2023-01-09 MED ORDER — LIDOCAINE 4 % EX PTCH: 1.0000 | MEDICATED_PATCH | CUTANEOUS | 0 refills | Status: DC

## 2023-01-09 NOTE — ED Notes (Signed)
Reviewed discharge instructions with patient. Follow-up care, medications and pain management reviewed. Patient verbalized understanding. Patient A&Ox4, VSS, and ambulatory with steady gait upon discharge.

## 2023-01-09 NOTE — Discharge Instructions (Signed)
Continue resting her knees, icing her knees, compressing as needed and elevating for swelling.  Continue taking Mobic, Tylenol and lidocaine patches for pain control.  Follow-up with your orthopedist as planned.  Come back if any new injuries, severe worsening uncontrolled pain, loss of sensation, weakness, increased redness and warmth with fever, or any other symptoms concerning to you.

## 2023-01-09 NOTE — ED Triage Notes (Signed)
Pt arrived POV from home stating she has arthritis in both knees and ever since yesterday her knees have been swollen. Pt states normally she will soak in a salt bath and it will help but no relief this time and she tried going to work and was unable to walk.

## 2023-01-09 NOTE — ED Notes (Signed)
X-ray at bedside

## 2023-01-09 NOTE — ED Provider Notes (Signed)
[facility] Provider Note   CSN: [id number] Arrival date & time: 01/09/23  0815     History  Chief Complaint  Patient presents with   Joint Swelling    Lutha Shocklee is a 57 y.o. female.  With PMH of HTN, HLD, obesity, arthritis, chronic knee pain who presents with bilateral knee pain and swelling.  Patient has known arthritis and says she does not have time to get knee replacement at this time but has seen orthopedics in the past.  She knows she needs a knee replacement.  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  She is on Mobic daily.  She also uses lidocaine patches but has her last patch at home.  She feels like the lidocaine works well.  She also ices and heats her knees and elevates them.  She has had no fevers or infectious symptoms.  She had some minor swelling of the knees which she notes resolved.  She has had no redness or warmth.  No recent traumatic injuries.  No known history of gout.  HPI     Home Medications Prior to Admission medications   Medication Sig Start Date End Date Taking? Authorizing Provider  benzonatate (TESSALON) 200 MG capsule Take 1 capsule (200 mg total) by mouth 3 (three) times daily as needed for cough. 11/08/22   de [place], Raymond J, MD  lidocaine 4 % Place 1 patch onto the skin daily. 11/03/22   Francene Finders, PA-C  meloxicam (MOBIC) 7.5 MG tablet Take 1 tablet (7.5 mg total) by mouth daily. 11/03/22   Francene Finders, PA-C  fexofenadine (ALLEGRA) 180 MG tablet Take 1 tablet (180 mg total) by mouth daily. Patient not taking: Reported on 10/04/2017 02/27/17 02/26/20  Mack Hook, MD  fluticasone [facility]) 50 MCG/ACT nasal spray Place 1 spray into both nostrils daily. Patient not taking: Reported on 04/04/2018 12/26/17 02/26/20  Delia Heady, PA-C      Allergies    Patient has no known allergies.    Review of Systems   Review of  Systems  Physical Exam Updated Vital Signs BP (!) 143/73 (BP Location: Right Arm)   Pulse 78   Temp 98 F (36.7 C) (Oral)   Resp 18   Ht 5' 2"$  (1.575 m)   Wt 75.8 kg   SpO2 99%   BMI 30.54 kg/m  Physical Exam Constitutional: Alert and oriented. Well appearing and in no distress. Eyes: Conjunctivae are normal. ENT      Head: Normocephalic and atraumatic. Cardiovascular: S1, S2, equal palpable DP and PT pulses, normal and symmetric distal pulses are present in all extremities.Warm and well perfused. Respiratory: Normal respiratory effort. O2 sat 99 on RA Gastrointestinal: nondistended Musculoskeletal: Normal range of motion in all extremities.      Right lower leg: Minimal tenderness of the right medial knee joint.  Negative anterior posterior drawer sign.  No instability.  There is no erythema, no warmth, no effusion.  Full range of motion intact without tenderness of right hip, knee, ankle and foot.  No pain with passive range of motion.  Sensation grossly intact of entire right lower extremity.      Left lower leg: Minimal tenderness of the left medial knee joint.  There is no erythema, no warmth, no effusion. Negative anterior posterior drawer sign.  No instability.    Full range of motion intact without  tenderness of right hip, knee, ankle and foot.  No pain with passive range of motion.  Sensation grossly intact of entire right lower extremity. Neurologic: Normal speech and language. No gross focal neurologic deficits are appreciated. Skin: Skin is warm, dry and intact. No rash noted. Psychiatric: Mood and affect are normal. Speech and behavior are normal.  ED Results / Procedures / Treatments   Labs (all labs ordered are listed, but only abnormal results are displayed) Labs Reviewed - No data to display  EKG None  Radiology DG Knee 2 Views Right  Result Date: 01/09/2023 CLINICAL DATA:  Right knee pain for the past day. Evaluate for knee joint effusion. EXAM: RIGHT KNEE -  1-2 VIEW COMPARISON:  None Available. FINDINGS: No fracture or dislocation. Mild-to-moderate tricompartmental degenerative change of the knee, worse with the medial compartment with joint space loss, subchondral sclerosis and osteophytosis. No evidence of chondrocalcinosis. There is minimal spurring involving the superior pole of the patella. No knee joint effusion. Regional soft tissues appear normal. IMPRESSION: 1. No acute findings. Specifically, no evidence of a right knee joint effusion. 2. Mild-to-moderate tricompartmental degenerative change of the knee, worse within the medial compartment. Electronically Signed   By: Sandi Mariscal M.D.   On: 01/09/2023 08:50   DG Knee 2 Views Left  Result Date: 01/09/2023 CLINICAL DATA:  Left knee pain and swelling for the past day. Evaluate for joint effusion. EXAM: LEFT KNEE - 1-2 VIEW COMPARISON:  None Available. FINDINGS: No fracture or dislocation. Mild-to-moderate tricompartmental degenerative change of the knee, worse within the medial compartment with joint space loss, subchondral sclerosis and osteophytosis. There is minimal spurring involving the superior pole of the patella. No knee joint effusion. No evidence of chondrocalcinosis. Regional soft tissues appear normal. IMPRESSION: 1. No acute findings. Specifically, no evidence of a left knee joint effusion. 2. Mild-to-moderate tricompartmental degenerative change of the knee, worse within the medial compartment. Electronically Signed   By: Sandi Mariscal M.D.   On: 01/09/2023 08:48    Procedures Procedures    Medications Ordered in ED Medications  lidocaine (LIDODERM) 5 % 2 patch (has no administration in time range)  ketorolac (TORADOL) injection 30 mg (has no administration in time range)    ED Course/ Medical Decision Making/ A&P                            Medical Decision Making Johnnye Measel is a 57 y.o. female.  With PMH of HTN, HLD, obesity, arthritis, chronic knee pain who presents  with bilateral knee pain and swelling.   Patient's chronic knee pain consistent with chronic knee pain from known arthritis.  Obtained bilateral knee plain films which I personally reviewed no evidence of fracture dislocation or effusion.  Read by radiology as bilateral degenerative changes consistent with known arthritis.  Clinically has no findings concerning for septic arthritis, no concern or findings suggestive of cellulitis, no findings or concern for DVT.  Bilateral lower extremities neurovascularly intact.  Provided lidocaine patches and Toradol shot here.  Advised continued Mobic, Tylenol, lidocaine patches and follow-up with her orthopedist.  She is safe for discharge and in good condition.  Amount and/or Complexity of Data Reviewed Radiology: ordered.  Risk Prescription drug management.    Final Clinical Impression(s) / ED Diagnoses Final diagnoses:  None    Rx / DC Orders ED Discharge Orders     None         Jasminemarie Sherrard,  Valda Lamb, MD 01/09/23 [phone]

## 2023-01-31 ENCOUNTER — Encounter (HOSPITAL_BASED_OUTPATIENT_CLINIC_OR_DEPARTMENT_OTHER): Payer: Self-pay

## 2023-03-07 ENCOUNTER — Encounter (HOSPITAL_COMMUNITY): Payer: Self-pay | Admitting: Emergency Medicine

## 2023-03-07 ENCOUNTER — Ambulatory Visit (HOSPITAL_COMMUNITY)
Admission: EM | Admit: 2023-03-07 | Discharge: 2023-03-07 | Disposition: A | Discharge status: Home / Self Care | Payer: 59 | Attending: Internal Medicine | Admitting: Internal Medicine

## 2023-03-07 ENCOUNTER — Ambulatory Visit (HOSPITAL_BASED_OUTPATIENT_CLINIC_OR_DEPARTMENT_OTHER): Payer: Commercial Managed Care - HMO | Admitting: Family Medicine

## 2023-03-07 DIAGNOSIS — H60392 Other infective otitis externa, left ear: Secondary | ICD-10-CM | POA: Diagnosis not present

## 2023-03-07 DIAGNOSIS — J019 Acute sinusitis, unspecified: Secondary | ICD-10-CM | POA: Diagnosis not present

## 2023-03-07 DIAGNOSIS — H6123 Impacted cerumen, bilateral: Secondary | ICD-10-CM | POA: Diagnosis not present

## 2023-03-07 MED ORDER — OFLOXACIN 0.3 % OT SOLN: 10.0000 [drp] | Freq: Every day | OTIC | 0 refills | Status: DC

## 2023-03-07 MED ORDER — BENZONATATE 100 MG PO CAPS: 100.0000 mg | ORAL_CAPSULE | Freq: Three times a day (TID) | ORAL | 0 refills | Status: DC

## 2023-03-07 MED ORDER — AMOXICILLIN-POT CLAVULANATE 875-125 MG PO TABS: 1.0000 | ORAL_TABLET | Freq: Two times a day (BID) | ORAL | 0 refills | Status: DC

## 2023-03-07 MED ORDER — ALBUTEROL SULFATE HFA 108 (90 BASE) MCG/ACT IN AERS: 1.0000 | INHALATION_SPRAY | Freq: Four times a day (QID) | RESPIRATORY_TRACT | 0 refills | Status: DC | PRN

## 2023-03-07 NOTE — ED Triage Notes (Signed)
Pt reports for 2 weeks having congestion and ear pains and headaches. Pt reports taking Mucinex, no medications today.  BP 173/101 today at [facility]

## 2023-03-07 NOTE — Discharge Instructions (Addendum)
Your evaluation shows you have a bacterial sinus infection plus a viral infection of the upper airways of your lungs. Use the following medicines to help with your symptoms:  - Take antibiotic sent to pharmacy as directed to treat sinus infection. - You may use albuterol inhaler 1 to 2 puffs every 4-6 hours as needed for cough, shortness of breath, and wheezing. - Tessalon perles every 8 hours as needed for cough. - Purchase Mucinex over the counter and take this every 12 hours as needed for nasal congestion and cough. -Continue taking Zyrtec daily for allergies. Follow-up with your primary care provider for further evaluation and to ensure that your symptoms are improving with use of medications provided above.  We cleaned out your ears today.  Your right ear is clean, however your left ear shows signs of an external infection.  Use ofloxacin eardrops 10 drops to the left ear once daily for the next 7 days as prescribed.  Do not place any further Q-tips, Bobby pins, or anything smaller than your elbow into your ears as this will further push wax into your ear canal and cause it to get clogged again.  If you develop any new or worsening symptoms or do not improve in the next 2 to 3 days, please return.  If your symptoms are severe, please go to the emergency room.  Follow-up with your primary care provider for further evaluation and management of your symptoms as well as ongoing wellness visits.  I hope you feel better!
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
## 2023-05-15 ENCOUNTER — Telehealth (HOSPITAL_BASED_OUTPATIENT_CLINIC_OR_DEPARTMENT_OTHER): Payer: Self-pay | Admitting: Family Medicine

## 2023-05-15 NOTE — Telephone Encounter (Signed)
Prescription Request  05/15/2023  LOV: 11/08/2022  What is the name of the medication or equipment? Meloxicam  Have you contacted your pharmacy to request a refill? Yes   Which pharmacy would you like this sent to?  [id number] [place], [place] - [address] [place] [address] Phone: [phone] Fax: [phone]    Patient notified that their request is being sent to the clinical staff for review and that they should receive a response within 2 business days.   Please advise at [facility] [phone]

## 2023-05-22 MED ORDER — MELOXICAM 7.5 MG PO TABS: 7.5000 mg | ORAL_TABLET | Freq: Every day | ORAL | 0 refills | Status: DC

## 2023-05-22 NOTE — Addendum Note (Signed)
Addended by: DE Peru, Niraj Kudrna J on: 05/22/2023 01:44 PM   Modules accepted: Orders

## 2023-06-01 ENCOUNTER — Other Ambulatory Visit (HOSPITAL_BASED_OUTPATIENT_CLINIC_OR_DEPARTMENT_OTHER): Payer: Self-pay | Admitting: *Deleted

## 2023-06-01 ENCOUNTER — Telehealth (HOSPITAL_BASED_OUTPATIENT_CLINIC_OR_DEPARTMENT_OTHER): Payer: Self-pay | Admitting: *Deleted

## 2023-06-01 DIAGNOSIS — Z1211 Encounter for screening for malignant neoplasm of colon: Secondary | ICD-10-CM

## 2023-06-01 NOTE — Telephone Encounter (Signed)
Called to offer colon cancer screening. Pt advised on cologuard and colonoscopy options. Pt wanted to do colonoscopy. Pt referral sent to [facility]. There are no transportation issues at this time.

## 2023-06-11 ENCOUNTER — Encounter (HOSPITAL_COMMUNITY): Payer: Self-pay

## 2023-06-11 ENCOUNTER — Emergency Department (HOSPITAL_COMMUNITY)
Admission: EM | Admit: 2023-06-11 | Discharge: 2023-06-12 | Disposition: A | Discharge status: Home / Self Care | Payer: 59 | Attending: Emergency Medicine | Admitting: Emergency Medicine

## 2023-06-11 ENCOUNTER — Other Ambulatory Visit: Payer: Self-pay

## 2023-06-11 ENCOUNTER — Emergency Department (HOSPITAL_COMMUNITY): Payer: 59

## 2023-06-11 DIAGNOSIS — R0789 Other chest pain: Secondary | ICD-10-CM | POA: Diagnosis not present

## 2023-06-11 DIAGNOSIS — K0889 Other specified disorders of teeth and supporting structures: Secondary | ICD-10-CM | POA: Diagnosis not present

## 2023-06-11 DIAGNOSIS — R079 Chest pain, unspecified: Secondary | ICD-10-CM | POA: Diagnosis not present

## 2023-06-11 DIAGNOSIS — Z7951 Long term (current) use of inhaled steroids: Secondary | ICD-10-CM | POA: Insufficient documentation

## 2023-06-11 DIAGNOSIS — J45909 Unspecified asthma, uncomplicated: Secondary | ICD-10-CM | POA: Insufficient documentation

## 2023-06-11 DIAGNOSIS — R002 Palpitations: Secondary | ICD-10-CM | POA: Insufficient documentation

## 2023-06-11 LAB — TROPONIN I (HIGH SENSITIVITY): Troponin I (High Sensitivity): 4 ng/L (ref <18)

## 2023-06-11 LAB — BASIC METABOLIC PANEL
Anion gap: 10 (ref 5–15)
BUN: 13 mg/dL (ref 6–20)
CO2: 24 mmol/L (ref 22–32)
Calcium: 8.8 mg/dL — ABNORMAL LOW (ref 8.9–10.3)
Chloride: 105 mmol/L (ref 98–111)
Creatinine, Ser: 0.73 mg/dL (ref 0.44–1.00)
GFR, Estimated: >60 mL/min (ref >60)
Glucose, Bld: 114 mg/dL — ABNORMAL HIGH (ref 70–99)
Potassium: 3.8 mmol/L (ref 3.5–5.1)
Sodium: 139 mmol/L (ref 135–145)

## 2023-06-11 LAB — CBC
HCT: 41.6 % (ref 36.0–46.0)
Hemoglobin: 13.8 g/dL (ref 12.0–15.0)
MCH: 31.1 pg (ref 26.0–34.0)
MCHC: 33.2 g/dL (ref 30.0–36.0)
MCV: 93.7 fL (ref 80.0–100.0)
Platelets: 283 10*3/uL (ref 150–400)
RBC: 4.44 MIL/uL (ref 3.87–5.11)
RDW: 13.3 % (ref 11.5–15.5)
WBC: 10.1 10*3/uL (ref 4.0–10.5)
nRBC: 0 % (ref 0.0–0.2)

## 2023-06-11 LAB — HCG, SERUM, QUALITATIVE: Preg, Serum: NEGATIVE

## 2023-06-11 NOTE — ED Triage Notes (Signed)
Pt arrived via POV c/o 1. Dental pain left side 10/10   2. Palpitations Pt states that all day it feels like her heart has been beating hard and fast.

## 2023-06-12 NOTE — ED Notes (Signed)
Patient verbalizes understanding of discharge instructions. Opportunity for questioning and answers were provided. Armband removed by staff, pt discharged from ED. Pt ambulatory to ED waiting room with steady gait.  

## 2023-06-12 NOTE — Discharge Instructions (Signed)
Return to the ER at any time for further workup.  Your EKG and initial labs were reassuring.  Recommended repeat troponin test was declined.  Recheck with your primary care provider.

## 2023-06-12 NOTE — ED Provider Notes (Signed)
[place] EMERGENCY DEPARTMENT AT [facility] Provider Note   CSN: [id number] Arrival date & time: 06/11/23  1948     History  Chief Complaint  Patient presents with   Dental Pain        Palpitations    Angelica Miller is a 57 y.o. female.  57 year old female with complaint of feeling like her heart was pounding in her chest today around 3 PM.  This discomfort has been constant, nothing makes discomfort better or worse.  Not associated with shortness of breath, nausea, diaphoresis.  Past medical history of asthma, bronchitis, AV block, Mobitz type I, hyperlipidemia, GERD. Also reports left upper dental pain however states that she is following up with her dentist tomorrow morning and does not need this evaluated tonight.       Home Medications Prior to Admission medications   Medication Sig Start Date End Date Taking? Authorizing Provider  albuterol (VENTOLIN HFA) 108 (90 Base) MCG/ACT inhaler Inhale 1-2 puffs into the lungs every 6 (six) hours as needed for wheezing or shortness of breath. 03/07/23   Carlisle Beers, FNP  amoxicillin-clavulanate (AUGMENTIN) 875-125 MG tablet Take 1 tablet by mouth every 12 (twelve) hours. 03/07/23   Carlisle Beers, FNP  benzonatate (TESSALON) 100 MG capsule Take 1 capsule (100 mg total) by mouth every 8 (eight) hours. 03/07/23   Carlisle Beers, FNP  lidocaine 4 % Place 1 patch onto the skin daily. 11/03/22   Tomi Bamberger, PA-C  lidocaine 4 % Place 1 patch onto the skin daily. 01/09/23   Mardene Sayer, MD  meloxicam (MOBIC) 7.5 MG tablet Take 1 tablet (7.5 mg total) by mouth daily. 05/22/23   de [place], Buren Kos, MD  ofloxacin (FLOXIN) 0.3 % OTIC solution Place 10 drops into the left ear daily. 03/07/23   Carlisle Beers, FNP  fexofenadine (ALLEGRA) 180 MG tablet Take 1 tablet (180 mg total) by mouth daily. Patient not taking: Reported on 10/04/2017 02/27/17 02/26/20  Julieanne Manson, MD   fluticasone [facility]) 50 MCG/ACT nasal spray Place 1 spray into both nostrils daily. Patient not taking: Reported on 04/04/2018 12/26/17 02/26/20  Dietrich Pates, PA-C      Allergies    Patient has no known allergies.    Review of Systems   Review of Systems Negative except as per HPI Physical Exam Updated Vital Signs BP (!) 144/83   Pulse 70   Temp 97.9 F (36.6 C) (Temporal)   Resp (!) 21   Ht 5\' 2"  (1.575 m)   Wt 77.6 kg   SpO2 100%   BMI 31.28 kg/m  Physical Exam Vitals and nursing note reviewed.  Constitutional:      General: She is not in acute distress.    Appearance: She is well-developed. She is not diaphoretic.  HENT:     Head: Normocephalic and atraumatic.     Jaw: Trismus present.     Mouth/Throat:     Comments: Several teeth missing or decay to gumline.  No obvious abscess Cardiovascular:     Rate and Rhythm: Normal rate and regular rhythm.     Pulses: Normal pulses.     Heart sounds: Normal heart sounds.  Pulmonary:     Effort: Pulmonary effort is normal.     Breath sounds: Normal breath sounds.  Abdominal:     Palpations: Abdomen is soft.     Tenderness: There is no abdominal tenderness.  Musculoskeletal:     Right lower  leg: No edema.     Left lower leg: No edema.  Skin:    General: Skin is warm and dry.     Findings: No erythema or rash.  Neurological:     Mental Status: She is alert and oriented to person, place, and time.  Psychiatric:        Behavior: Behavior normal.     ED Results / Procedures / Treatments   Labs (all labs ordered are listed, but only abnormal results are displayed) Labs Reviewed  BASIC METABOLIC PANEL - Abnormal; Notable for the following components:      Result Value   Glucose, Bld 114 (*)    Calcium 8.8 (*)    All other components within normal limits  CBC  HCG, SERUM, QUALITATIVE  TROPONIN I (HIGH SENSITIVITY)    EKG None  Radiology DG Chest 2 View  Result Date: 06/11/2023 CLINICAL DATA:  Palpitations  EXAM: CHEST - 2 VIEW COMPARISON:  10/17/2022 FINDINGS: The heart size and mediastinal contours are within normal limits. Both lungs are clear. The visualized skeletal structures are unremarkable. IMPRESSION: No active cardiopulmonary disease. Electronically Signed   By: Charlett Nose M.D.   On: 06/11/2023 20:49    Procedures Procedures    Medications Ordered in ED Medications - No data to display  ED Course/ Medical Decision Making/ A&P                             Medical Decision Making Amount and/or Complexity of Data Reviewed Labs: ordered. Radiology: ordered.  57 year old female with past medical history of migraines, reflux, asthma, chronic bronchitis, AV block Mobitz 1, hyperlipidemia presents with complaint of palpitations/chest discomfort as above.  EKG without ischemic changes, initial troponin is reassuring.  CBC normal, hCG negative.  BMP without significant findings.  Recommend repeat troponin.  Chest x-ray is negative for acute findings, agree with radiologist interpretation.  Patient declines and would like to be discharged at this time.  She agrees to palp with primary care provider.  In regards to the mention of a dental problem in triage, she is scheduled follow-up with her dentist in the morning and would prefer to see them for this problem.  Advised patient without a repeat troponin we are unable to complete her cardiac workup today.  She is leaving AGAINST MEDICAL ADVICE.        Final Clinical Impression(s) / ED Diagnoses Final diagnoses:  Palpitations  Chest pain, unspecified type    Rx / DC Orders ED Discharge Orders     None         Jeannie Fend, PA-C 06/12/23 0645    Mesner, Barbara Cower, MD 06/12/23 [phone]

## 2023-06-14 ENCOUNTER — Ambulatory Visit (INDEPENDENT_AMBULATORY_CARE_PROVIDER_SITE_OTHER): Payer: 59 | Admitting: Family Medicine

## 2023-06-14 ENCOUNTER — Encounter (HOSPITAL_BASED_OUTPATIENT_CLINIC_OR_DEPARTMENT_OTHER): Payer: Self-pay | Admitting: Family Medicine

## 2023-06-14 VITALS — BP 128/66 | HR 64 | Ht 62.0 in | Wt 172.0 lb

## 2023-06-14 DIAGNOSIS — Z1231 Encounter for screening mammogram for malignant neoplasm of breast: Secondary | ICD-10-CM | POA: Diagnosis not present

## 2023-06-14 DIAGNOSIS — R030 Elevated blood-pressure reading, without diagnosis of hypertension: Secondary | ICD-10-CM

## 2023-06-14 DIAGNOSIS — E559 Vitamin D deficiency, unspecified: Secondary | ICD-10-CM

## 2023-06-14 DIAGNOSIS — Z0000 Encounter for general adult medical examination without abnormal findings: Secondary | ICD-10-CM | POA: Diagnosis not present

## 2023-06-14 MED ORDER — LIDOCAINE 4 % EX PTCH: 1.0000 | MEDICATED_PATCH | CUTANEOUS | 0 refills | Status: DC

## 2023-06-14 NOTE — Progress Notes (Signed)
    Procedures performed today:    None.  Independent interpretation of notes and tests performed by another provider:   None.  Brief History, Exam, Impression, and Recommendations:    BP 128/66   Pulse 64   Ht 5\' 2"  (1.575 m)   Wt 172 lb (78 kg)   BMI 31.46 kg/m   Elevated blood-pressure reading without diagnosis of hypertension Assessment & Plan: Patient doing well, blood pressure at goal in office today, did have recent episode of palpitations which led to presentation in the emergency department.  Workup in the ER was reassuring, they did plan to do second troponin, however patient left prior to this being completed.  She does not have any current symptoms today. Given good control of blood pressure in the office, can continue with intermittent monitoring at home and we will monitor regularly at visits here.  Recommend DASH diet.   Encounter for screening mammogram for malignant neoplasm of breast -     Digital Screening Mammogram, Left and Right; Future  Wellness examination -     CBC with Differential/Platelet; Future -     Comprehensive metabolic panel; Future -     Hemoglobin A1c; Future -     Lipid panel; Future -     TSH Rfx on Abnormal to Free T4; Future -     VITAMIN D 25 Hydroxy (Vit-D Deficiency, Fractures); Future  Vitamin D deficiency Assessment & Plan: Previously was within normal range, she continues with OTC supplementation.  Recommend continuing with current dose.  She is not aware of current dose, advised on checking what dose she is taking so that we know how to possibly adjust dose in the future should vitamin D level be abnormal.  We will plan to recheck vitamin D level with upcoming labs  Orders: -     VITAMIN D 25 Hydroxy (Vit-D Deficiency, Fractures); Future  Other orders -     Lidocaine; Place 1 patch onto the skin daily.  Dispense: 90 patch; Refill: 0  Requesting refill of lidocaine patches which she utilizes for her knee pain, refill  provided today.  She has not had follow-up with orthopedic surgeons office in more than 1 year.  Recommend reaching out to their office to schedule follow-up to further review the recommendations regarding management  Return in about 2 months (around 08/15/2023) for CPE with fasting labs 1 week prior.  Spent 32 minutes on this patient encounter, including preparation, chart review, face-to-face counseling with patient and coordination of care, and documentation of encounter. Review of office notes from specialist, review of ED notes, labs, imaging from ED visit 3 days ago.   ___________________________________________ Lina Hitch de [place], MD, ABFM, CAQSM Primary Care and Sports Medicine [facility]

## 2023-06-14 NOTE — Assessment & Plan Note (Signed)
Patient doing well, blood pressure at goal in office today, did have recent episode of palpitations which led to presentation in the emergency department.  Workup in the ER was reassuring, they did plan to do second troponin, however patient left prior to this being completed.  She does not have any current symptoms today. Given good control of blood pressure in the office, can continue with intermittent monitoring at home and we will monitor regularly at visits here.  Recommend DASH diet.

## 2023-06-14 NOTE — Assessment & Plan Note (Signed)
Previously was within normal range, she continues with OTC supplementation.  Recommend continuing with current dose.  She is not aware of current dose, advised on checking what dose she is taking so that we know how to possibly adjust dose in the future should vitamin D level be abnormal.  We will plan to recheck vitamin D level with upcoming labs

## 2023-06-15 ENCOUNTER — Ambulatory Visit
Admission: RE | Admit: 2023-06-15 | Discharge: 2023-06-15 | Disposition: A | Discharge status: Home / Self Care | Payer: 59 | Source: Ambulatory Visit | Attending: Family Medicine | Admitting: Family Medicine

## 2023-06-15 DIAGNOSIS — Z1231 Encounter for screening mammogram for malignant neoplasm of breast: Secondary | ICD-10-CM

## 2023-06-26 ENCOUNTER — Other Ambulatory Visit (HOSPITAL_BASED_OUTPATIENT_CLINIC_OR_DEPARTMENT_OTHER): Payer: Self-pay | Admitting: Family Medicine

## 2023-08-10 ENCOUNTER — Other Ambulatory Visit (HOSPITAL_BASED_OUTPATIENT_CLINIC_OR_DEPARTMENT_OTHER): Payer: 59

## 2023-08-10 ENCOUNTER — Telehealth (HOSPITAL_BASED_OUTPATIENT_CLINIC_OR_DEPARTMENT_OTHER): Payer: Self-pay | Admitting: Family Medicine

## 2023-08-10 ENCOUNTER — Other Ambulatory Visit (HOSPITAL_BASED_OUTPATIENT_CLINIC_OR_DEPARTMENT_OTHER): Payer: Self-pay | Admitting: *Deleted

## 2023-08-10 MED ORDER — ALBUTEROL SULFATE HFA 108 (90 BASE) MCG/ACT IN AERS: 1.0000 | INHALATION_SPRAY | Freq: Four times a day (QID) | RESPIRATORY_TRACT | 0 refills | Status: DC | PRN

## 2023-08-10 NOTE — Telephone Encounter (Signed)
Refill sent to [facility] pharmacy.

## 2023-08-10 NOTE — Telephone Encounter (Signed)
Patient states she needs an inhaler called into [facility] pharmacy, she is having a bronchocitis flair up

## 2023-08-16 ENCOUNTER — Other Ambulatory Visit (HOSPITAL_BASED_OUTPATIENT_CLINIC_OR_DEPARTMENT_OTHER): Payer: 59

## 2023-08-17 ENCOUNTER — Encounter (HOSPITAL_BASED_OUTPATIENT_CLINIC_OR_DEPARTMENT_OTHER): Payer: 59 | Admitting: Family Medicine

## 2023-08-28 ENCOUNTER — Telehealth: Payer: Self-pay

## 2023-08-28 ENCOUNTER — Ambulatory Visit: Payer: 59 | Admitting: Physician Assistant

## 2023-08-28 NOTE — Telephone Encounter (Signed)
Per patients request rescheduled to tomorrow

## 2023-08-28 NOTE — Telephone Encounter (Signed)
Patient called wanting to know if there will be a copay for her knee injections scheduled for this afternoon at 4. She said she doesn't want to waste her gas coming here if she has to pay. CB # [phone]

## 2023-08-29 ENCOUNTER — Encounter: Payer: Self-pay | Admitting: Physician Assistant

## 2023-08-29 ENCOUNTER — Telehealth: Payer: Self-pay

## 2023-08-29 ENCOUNTER — Ambulatory Visit (INDEPENDENT_AMBULATORY_CARE_PROVIDER_SITE_OTHER): Payer: 59 | Admitting: Physician Assistant

## 2023-08-29 DIAGNOSIS — M170 Bilateral primary osteoarthritis of knee: Secondary | ICD-10-CM | POA: Diagnosis not present

## 2023-08-29 DIAGNOSIS — M1711 Unilateral primary osteoarthritis, right knee: Secondary | ICD-10-CM

## 2023-08-29 DIAGNOSIS — M1712 Unilateral primary osteoarthritis, left knee: Secondary | ICD-10-CM

## 2023-08-29 MED ORDER — METHYLPREDNISOLONE ACETATE 40 MG/ML IJ SUSP
40.0000 mg | INTRAMUSCULAR | Status: AC | PRN
Start: 2023-08-29 — End: 2023-08-29
  Administered 2023-08-29: 40 mg via INTRA_ARTICULAR

## 2023-08-29 MED ORDER — LIDOCAINE HCL 1 % IJ SOLN
3.0000 mL | INTRAMUSCULAR | Status: AC | PRN
Start: 2023-08-29 — End: 2023-08-29
  Administered 2023-08-29: 3 mL

## 2023-08-29 NOTE — Telephone Encounter (Signed)
Visco supplementation for bilateral knee oa - has new insurance since previous prior [place] request
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
## 2023-09-04 NOTE — Telephone Encounter (Signed)
VOB submitted for Monovisc, bilateral knee

## 2023-09-11 ENCOUNTER — Encounter (HOSPITAL_BASED_OUTPATIENT_CLINIC_OR_DEPARTMENT_OTHER): Payer: 59 | Admitting: Family Medicine

## 2023-09-17 ENCOUNTER — Other Ambulatory Visit (HOSPITAL_BASED_OUTPATIENT_CLINIC_OR_DEPARTMENT_OTHER): Payer: Self-pay | Admitting: Family Medicine

## 2023-09-25 ENCOUNTER — Encounter (HOSPITAL_BASED_OUTPATIENT_CLINIC_OR_DEPARTMENT_OTHER): Payer: 59 | Admitting: Family Medicine

## 2023-10-05 ENCOUNTER — Other Ambulatory Visit: Payer: Self-pay

## 2023-10-05 ENCOUNTER — Emergency Department (HOSPITAL_COMMUNITY): Admission: EM | Admit: 2023-10-05 | Discharge: 2023-10-05 | Discharge status: Against Medical Advice | Payer: 59

## 2023-10-05 ENCOUNTER — Ambulatory Visit (HOSPITAL_COMMUNITY)
Admission: EM | Admit: 2023-10-05 | Discharge: 2023-10-05 | Disposition: A | Discharge status: Home / Self Care | Payer: 59 | Attending: Emergency Medicine | Admitting: Emergency Medicine

## 2023-10-05 ENCOUNTER — Encounter (HOSPITAL_COMMUNITY): Payer: Self-pay | Admitting: Emergency Medicine

## 2023-10-05 DIAGNOSIS — R079 Chest pain, unspecified: Secondary | ICD-10-CM

## 2023-10-05 DIAGNOSIS — M79602 Pain in left arm: Secondary | ICD-10-CM

## 2023-10-05 NOTE — ED Triage Notes (Signed)
Pt c/o left LUQ pain that radiates to her back and left arm pains for about 2-3 days. Reports had to leave work but had BP taken there and was given Tylenol and Ibuprofen.

## 2023-10-05 NOTE — ED Notes (Signed)
Patient is being discharged from the Urgent Care and sent to the Emergency Department via POV. Per [place] Garrison, NP, patient is in need of higher level of care due to chest pain. Patient is aware and verbalizes understanding of plan of care.  Vitals:   10/05/23 1606  BP: 138/89  Pulse: 75  Resp: 16  Temp: 97.9 F (36.6 C)  SpO2: 98%

## 2023-10-05 NOTE — Discharge Instructions (Signed)
Please head immediately to the nearest emergency department for further evaluation.

## 2023-10-05 NOTE — ED Notes (Signed)
Pt unable to be found in lobby. 
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
## 2023-10-06 ENCOUNTER — Emergency Department (HOSPITAL_BASED_OUTPATIENT_CLINIC_OR_DEPARTMENT_OTHER): Payer: 59

## 2023-10-06 ENCOUNTER — Encounter (HOSPITAL_BASED_OUTPATIENT_CLINIC_OR_DEPARTMENT_OTHER): Payer: Self-pay | Admitting: *Deleted

## 2023-10-06 ENCOUNTER — Emergency Department (HOSPITAL_BASED_OUTPATIENT_CLINIC_OR_DEPARTMENT_OTHER): Payer: 59 | Admitting: Radiology

## 2023-10-06 ENCOUNTER — Emergency Department (HOSPITAL_BASED_OUTPATIENT_CLINIC_OR_DEPARTMENT_OTHER)
Admission: EM | Admit: 2023-10-06 | Discharge: 2023-10-06 | Disposition: A | Discharge status: Home / Self Care | Payer: 59 | Attending: Emergency Medicine | Admitting: Emergency Medicine

## 2023-10-06 ENCOUNTER — Other Ambulatory Visit: Payer: Self-pay

## 2023-10-06 DIAGNOSIS — R0789 Other chest pain: Secondary | ICD-10-CM | POA: Insufficient documentation

## 2023-10-06 DIAGNOSIS — R109 Unspecified abdominal pain: Secondary | ICD-10-CM | POA: Insufficient documentation

## 2023-10-06 DIAGNOSIS — R079 Chest pain, unspecified: Secondary | ICD-10-CM | POA: Diagnosis not present

## 2023-10-06 DIAGNOSIS — R059 Cough, unspecified: Secondary | ICD-10-CM | POA: Insufficient documentation

## 2023-10-06 DIAGNOSIS — D259 Leiomyoma of uterus, unspecified: Secondary | ICD-10-CM | POA: Diagnosis not present

## 2023-10-06 DIAGNOSIS — I700 Atherosclerosis of aorta: Secondary | ICD-10-CM | POA: Diagnosis not present

## 2023-10-06 DIAGNOSIS — R0602 Shortness of breath: Secondary | ICD-10-CM | POA: Insufficient documentation

## 2023-10-06 DIAGNOSIS — R748 Abnormal levels of other serum enzymes: Secondary | ICD-10-CM | POA: Diagnosis not present

## 2023-10-06 DIAGNOSIS — R1012 Left upper quadrant pain: Secondary | ICD-10-CM | POA: Diagnosis not present

## 2023-10-06 LAB — COMPREHENSIVE METABOLIC PANEL
ALT: 12 U/L (ref 0–44)
AST: 14 U/L — ABNORMAL LOW (ref 15–41)
Albumin: 4.2 g/dL (ref 3.5–5.0)
Alkaline Phosphatase: 67 U/L (ref 38–126)
Anion gap: 8 (ref 5–15)
BUN: 15 mg/dL (ref 6–20)
CO2: 25 mmol/L (ref 22–32)
Calcium: 8.8 mg/dL — ABNORMAL LOW (ref 8.9–10.3)
Chloride: 107 mmol/L (ref 98–111)
Creatinine, Ser: 0.68 mg/dL (ref 0.44–1.00)
GFR, Estimated: >60 mL/min (ref >60)
Glucose, Bld: 94 mg/dL (ref 70–99)
Potassium: 3.8 mmol/L (ref 3.5–5.1)
Sodium: 140 mmol/L (ref 135–145)
Total Bilirubin: 0.4 mg/dL (ref <1.2)
Total Protein: 7.3 g/dL (ref 6.5–8.1)

## 2023-10-06 LAB — CBC WITH DIFFERENTIAL/PLATELET
Abs Immature Granulocytes: 0.01 10*3/uL (ref 0.00–0.07)
Basophils Absolute: 0 10*3/uL (ref 0.0–0.1)
Basophils Relative: 0 %
Eosinophils Absolute: 0 10*3/uL (ref 0.0–0.5)
Eosinophils Relative: 0 %
HCT: 41.5 % (ref 36.0–46.0)
Hemoglobin: 13.8 g/dL (ref 12.0–15.0)
Immature Granulocytes: 0 %
Lymphocytes Relative: 44 %
Lymphs Abs: 3.2 10*3/uL (ref 0.7–4.0)
MCH: 30.7 pg (ref 26.0–34.0)
MCHC: 33.3 g/dL (ref 30.0–36.0)
MCV: 92.4 fL (ref 80.0–100.0)
Monocytes Absolute: 0.5 10*3/uL (ref 0.1–1.0)
Monocytes Relative: 6 %
Neutro Abs: 3.7 10*3/uL (ref 1.7–7.7)
Neutrophils Relative %: 50 %
Platelets: 283 10*3/uL (ref 150–400)
RBC: 4.49 MIL/uL (ref 3.87–5.11)
RDW: 13.4 % (ref 11.5–15.5)
WBC: 7.4 10*3/uL (ref 4.0–10.5)
nRBC: 0 % (ref 0.0–0.2)

## 2023-10-06 LAB — D-DIMER, QUANTITATIVE: D-Dimer, Quant: <0.27 ug{FEU}/mL (ref 0.00–0.50)

## 2023-10-06 LAB — TROPONIN I (HIGH SENSITIVITY): Troponin I (High Sensitivity): 4 ng/L (ref <18)

## 2023-10-06 LAB — LIPASE, BLOOD: Lipase: 164 U/L — ABNORMAL HIGH (ref 11–51)

## 2023-10-06 MED ORDER — IOHEXOL 350 MG/ML SOLN
80.0000 mL | Freq: Once | INTRAVENOUS | Status: AC | PRN
  Administered 2023-10-06: 80 mL via INTRAVENOUS

## 2023-10-06 MED ORDER — ASPIRIN 81 MG PO CHEW
324.0000 mg | CHEWABLE_TABLET | Freq: Once | ORAL | Status: AC
  Administered 2023-10-06: 324 mg via ORAL
  Filled 2023-10-06: qty 4

## 2023-10-06 NOTE — ED Notes (Signed)
Pt transported to xray via wheel chair

## 2023-10-06 NOTE — ED Provider Notes (Signed)
[place] EMERGENCY DEPARTMENT AT [facility] Provider Note   CSN: [id number] Arrival date & time: 10/06/23  2440     History  Chief Complaint  Patient presents with   Chest Pain    Angelica Miller is a 57 y.o. female.  Patient with a history of migraines, GERD, bronchitis, previous Mobitz 1 AV block presenting with 2 days of left-sided chest and arm pain.  She reports this pain has been constant but got worse yesterday while she was working.  She describes pain below her left breast that radiates to her lateral rib cage and upper arm.  Pain worse with movement of her left arm and with deep breathing.  Does feel somewhat short of breath.  Has had a cough but nonproductive.  She did have a fever earlier in the week with bodyaches but those have resolved.  Still having a cough and feeling soreness in her ribs worse with coughing and movement.  Pain not exertional but is worse with breathing.  Did not take any for the pain at home.  She went to urgent care yesterday and was told to go to the ED for further evaluation.  She left without being seen after being triaged. She comes in this morning still with persistent left-sided rib pain and arm pain.  Does feel short of breath and still coughing.  Denies any nausea, vomiting, diaphoresis, abdominal pain, rash, leg pain, leg swelling, fever or cough. She does not think she is ever had a stress test.  Care everywhere shows she did have a nuclear stress test remotely in [place] in 2013  The history is provided by the patient.  Chest Pain Associated symptoms: cough and shortness of breath   Associated symptoms: no abdominal pain, no dizziness, no fever, no headache, no nausea, no vomiting and no weakness        Home Medications Prior to Admission medications   Medication Sig Start Date End Date Taking? Authorizing Provider  acetaminophen-codeine (TYLENOL #3) 300-30 MG tablet Take 1 tablet by mouth every 4 (four) hours as  needed. 05/31/23   [provider]  albuterol (VENTOLIN HFA) 108 (90 Base) MCG/ACT inhaler Inhale 1-2 puffs into the lungs every 6 (six) hours as needed for wheezing or shortness of breath. 08/10/23   de [place], Raymond J, MD  lidocaine 4 % Place 1 patch onto the skin daily. 06/14/23   de [place], Buren Kos, MD  meloxicam (MOBIC) 7.5 MG tablet TAKE 1 TABLET BY MOUTH EVERY DAY 09/17/23   de [place], Buren Kos, MD  ofloxacin (FLOXIN) 0.3 % OTIC solution Place 10 drops into the left ear daily. 03/07/23   Carlisle Beers, FNP  fexofenadine (ALLEGRA) 180 MG tablet Take 1 tablet (180 mg total) by mouth daily. Patient not taking: Reported on 10/04/2017 02/27/17 02/26/20  Julieanne Manson, MD  fluticasone [facility]) 50 MCG/ACT nasal spray Place 1 spray into both nostrils daily. Patient not taking: Reported on 04/04/2018 12/26/17 02/26/20  Dietrich Pates, PA-C      Allergies    Patient has no known allergies.    Review of Systems   Review of Systems  Constitutional:  Negative for activity change, appetite change and fever.  HENT:  Negative for congestion and rhinorrhea.   Respiratory:  Positive for cough, chest tightness and shortness of breath.   Cardiovascular:  Positive for chest pain.  Gastrointestinal:  Negative for abdominal pain, nausea and vomiting.  Genitourinary:  Negative for dysuria and hematuria.  Musculoskeletal:  Negative  for arthralgias and myalgias.  Skin:  Negative for rash.  Neurological:  Negative for dizziness, weakness and headaches.   all other systems are negative except as noted in the HPI and PMH.    Physical Exam Updated Vital Signs BP (!) 151/88   Pulse 76   Temp 98.1 F (36.7 C)   Resp 13   SpO2 100%  Physical Exam Vitals and nursing note reviewed.  Constitutional:      General: She is not in acute distress.    Appearance: She is well-developed.  HENT:     Head: Normocephalic and atraumatic.     Mouth/Throat:     Pharynx: No oropharyngeal exudate.  Eyes:      Conjunctiva/sclera: Conjunctivae normal.     Pupils: Pupils are equal, round, and reactive to light.  Neck:     Comments: No meningismus. Cardiovascular:     Rate and Rhythm: Normal rate and regular rhythm.     Heart sounds: Normal heart sounds. No murmur heard. Pulmonary:     Effort: Pulmonary effort is normal. No respiratory distress.     Breath sounds: Normal breath sounds.     Comments: Left lower chest wall tenderness.  No rash. Chest:     Chest wall: Tenderness present.  Abdominal:     Palpations: Abdomen is soft.     Tenderness: There is no abdominal tenderness. There is no guarding or rebound.  Musculoskeletal:        General: No tenderness. Normal range of motion.     Cervical back: Normal range of motion and neck supple.  Skin:    General: Skin is warm.  Neurological:     Mental Status: She is alert and oriented to person, place, and time.     Cranial Nerves: No cranial nerve deficit.     Motor: No abnormal muscle tone.     Coordination: Coordination normal.     Comments:  5/5 strength throughout. CN 2-12 intact.Equal grip strength.   Psychiatric:        Behavior: Behavior normal.     ED Results / Procedures / Treatments   Labs (all labs ordered are listed, but only abnormal results are displayed) Labs Reviewed  CBC WITH DIFFERENTIAL/PLATELET  COMPREHENSIVE METABOLIC PANEL  LIPASE, BLOOD  D-DIMER, QUANTITATIVE  TROPONIN I (HIGH SENSITIVITY)    EKG EKG Interpretation Date/Time:  Saturday October 06 2023 06:19:46 EST Ventricular Rate:  67 PR Interval:  150 QRS Duration:  93 QT Interval:  424 QTC Calculation: 448 R Axis:   9  Text Interpretation: Sinus rhythm No significant change was found Confirmed by Glynn Octave [phone]) on 10/06/2023 6:23:26 AM  Radiology No results found.  Procedures Procedures    Medications Ordered in ED Medications  aspirin chewable tablet 324 mg (has no administration in time range)    ED Course/ Medical  Decision Making/ A&P                                 Medical Decision Making Amount and/or Complexity of Data Reviewed Labs: ordered. Decision-making details documented in ED Course. Radiology: ordered and independent interpretation performed. Decision-making details documented in ED Course. ECG/medicine tests: ordered and independent interpretation performed. Decision-making details documented in ED Course.  Risk OTC drugs.   Left-sided chest pain and upper arm pain with shortness of breath.  Recent fever and cough.  Vitals are stable.  No distress.  No hypoxia increased work  of breathing.  Clear lungs.  EKG is sinus rhythm without acute ischemia.  Pain seems reproducible with low suspicion for ACS.  Will check labs and chest x-ray.  Labs and x-ray pending at shift change.  Anticipate likely discharge home with consideration of outpatient stress test if results are reassuring.  Her pain seems atypical for ACS and has been ongoing for the past 2 days worse with breathing and worse with movement.  Dr. Rubin Payor to assume care at shift change pending labs including troponin and D-dimer and CXR.       Final Clinical Impression(s) / ED Diagnoses Final diagnoses:  None    Rx / DC Orders ED Discharge Orders     None         Montario Zilka, Jeannett Senior, MD 10/06/23 [phone]

## 2023-10-06 NOTE — ED Provider Notes (Signed)
  Physical Exam  BP 137/75   Pulse 74   Temp 98.1 F (36.7 C)   Resp 14   SpO2 100%   Physical Exam  Procedures  Procedures  ED Course / MDM    Medical Decision Making Amount and/or Complexity of Data Reviewed Labs: ordered. Radiology: ordered.  Risk OTC drugs. Prescription drug management.   Received in signout.  Chest pain.  On my exam however has left upper quadrant tenderness lipase is mildly elevated.  States she occasionally drinks some alcohol but not heavily.  Denies history of gallstones.  CT scan done and reassuring.  Should be able to follow-up with PCP.  Troponin negative.  D-dimer negative.  Will discharge.       Benjiman Core, MD 10/06/23 [phone]

## 2023-10-06 NOTE — Discharge Instructions (Addendum)
There is no evidence of heart attack or blood clot in the lung.  Follow-up with your doctor.  Use anti-inflammatories as needed for your chest discomfort.  Return to the ED with exertional chest pain, pain associate with shortness of breath, nausea, vomiting, sweating or other concerns.  Your lipase was also elevated.  There was some tenderness to the abdomen.  The CT scan is reassuring.  Follow-up with your primary care doctor.

## 2023-10-06 NOTE — ED Triage Notes (Signed)
Pt arrives ambulatory to treatment room reporting left sided chest pain, arm pain and left sided back pain for about 3 days. She has also had SOB, cough, and sinus drainage. She denies fevers.

## 2023-10-06 NOTE — ED Notes (Signed)
Dc instructions reviewed with patient. Patient voiced understanding. Dc with belongings.  °

## 2023-10-06 NOTE — ED Notes (Signed)
Transport to ct

## 2023-10-09 ENCOUNTER — Other Ambulatory Visit (HOSPITAL_BASED_OUTPATIENT_CLINIC_OR_DEPARTMENT_OTHER): Payer: Self-pay | Admitting: Family Medicine

## 2023-10-09 ENCOUNTER — Telehealth (HOSPITAL_BASED_OUTPATIENT_CLINIC_OR_DEPARTMENT_OTHER): Payer: Self-pay | Admitting: Family Medicine

## 2023-10-09 ENCOUNTER — Other Ambulatory Visit (HOSPITAL_BASED_OUTPATIENT_CLINIC_OR_DEPARTMENT_OTHER): Payer: Self-pay | Admitting: *Deleted

## 2023-10-09 MED ORDER — MELOXICAM 7.5 MG PO TABS: 7.5000 mg | ORAL_TABLET | Freq: Every day | ORAL | 0 refills | Status: DC

## 2023-10-09 NOTE — Telephone Encounter (Signed)
Pt called to request refill prescription for meloxicam 7.5mg  sent to pharmacy

## 2023-10-09 NOTE — Telephone Encounter (Signed)
Pt medication sent to pharmacy  

## 2023-10-15 ENCOUNTER — Encounter (HOSPITAL_BASED_OUTPATIENT_CLINIC_OR_DEPARTMENT_OTHER): Payer: 59 | Admitting: Family Medicine

## 2023-11-19 ENCOUNTER — Telehealth (HOSPITAL_BASED_OUTPATIENT_CLINIC_OR_DEPARTMENT_OTHER): Payer: Self-pay | Admitting: *Deleted

## 2023-11-19 ENCOUNTER — Encounter (HOSPITAL_BASED_OUTPATIENT_CLINIC_OR_DEPARTMENT_OTHER): Payer: Self-pay | Admitting: *Deleted

## 2023-11-19 NOTE — Telephone Encounter (Signed)
LVM for pt to call the office to schedule missed physical with dr de Peru

## 2023-12-06 ENCOUNTER — Other Ambulatory Visit (HOSPITAL_BASED_OUTPATIENT_CLINIC_OR_DEPARTMENT_OTHER): Payer: Self-pay | Admitting: Family Medicine

## 2024-01-02 ENCOUNTER — Other Ambulatory Visit (HOSPITAL_BASED_OUTPATIENT_CLINIC_OR_DEPARTMENT_OTHER): Payer: Self-pay | Admitting: Family Medicine

## 2024-01-02 DIAGNOSIS — Z0000 Encounter for general adult medical examination without abnormal findings: Secondary | ICD-10-CM | POA: Diagnosis not present

## 2024-01-03 ENCOUNTER — Encounter (HOSPITAL_BASED_OUTPATIENT_CLINIC_OR_DEPARTMENT_OTHER): Payer: Self-pay | Admitting: Family Medicine

## 2024-01-03 ENCOUNTER — Ambulatory Visit (INDEPENDENT_AMBULATORY_CARE_PROVIDER_SITE_OTHER): Payer: BC Managed Care – PPO | Admitting: Family Medicine

## 2024-01-03 VITALS — BP 139/74 | HR 75 | Ht 62.0 in | Wt 188.2 lb

## 2024-01-03 DIAGNOSIS — E785 Hyperlipidemia, unspecified: Secondary | ICD-10-CM

## 2024-01-03 DIAGNOSIS — E559 Vitamin D deficiency, unspecified: Secondary | ICD-10-CM

## 2024-01-03 DIAGNOSIS — Z0000 Encounter for general adult medical examination without abnormal findings: Secondary | ICD-10-CM | POA: Diagnosis not present

## 2024-01-03 LAB — COMPREHENSIVE METABOLIC PANEL
ALT: 15 [IU]/L (ref 0–32)
AST: 20 [IU]/L (ref 0–40)
Albumin: 4.3 g/dL (ref 3.8–4.9)
Alkaline Phosphatase: 83 [IU]/L (ref 44–121)
BUN/Creatinine Ratio: 22 (ref 9–23)
BUN: 16 mg/dL (ref 6–24)
Bilirubin Total: 0.2 mg/dL (ref 0.0–1.2)
CO2: 22 mmol/L (ref 20–29)
Calcium: 9.5 mg/dL (ref 8.7–10.2)
Chloride: 104 mmol/L (ref 96–106)
Creatinine, Ser: 0.72 mg/dL (ref 0.57–1.00)
Globulin, Total: 2.9 g/dL (ref 1.5–4.5)
Glucose: 97 mg/dL (ref 70–99)
Potassium: 4.1 mmol/L (ref 3.5–5.2)
Sodium: 144 mmol/L (ref 134–144)
Total Protein: 7.2 g/dL (ref 6.0–8.5)
eGFR: 97 mL/min/{1.73_m2} (ref >59)

## 2024-01-03 LAB — CBC WITH DIFFERENTIAL/PLATELET
Basophils Absolute: 0 10*3/uL (ref 0.0–0.2)
Basos: 1 %
EOS (ABSOLUTE): 0 10*3/uL (ref 0.0–0.4)
Eos: 0 %
Hematocrit: 43.1 % (ref 34.0–46.6)
Hemoglobin: 14.1 g/dL (ref 11.1–15.9)
Immature Grans (Abs): 0 10*3/uL (ref 0.0–0.1)
Immature Granulocytes: 0 %
Lymphocytes Absolute: 3.1 10*3/uL (ref 0.7–3.1)
Lymphs: 37 %
MCH: 31.2 pg (ref 26.6–33.0)
MCHC: 32.7 g/dL (ref 31.5–35.7)
MCV: 95 fL (ref 79–97)
Monocytes Absolute: 0.5 10*3/uL (ref 0.1–0.9)
Monocytes: 6 %
Neutrophils Absolute: 4.6 10*3/uL (ref 1.4–7.0)
Neutrophils: 56 %
Platelets: 299 10*3/uL (ref 150–450)
RBC: 4.52 x10E6/uL (ref 3.77–5.28)
RDW: 12.5 % (ref 11.7–15.4)
WBC: 8.2 10*3/uL (ref 3.4–10.8)

## 2024-01-03 LAB — LIPID PANEL
Chol/HDL Ratio: 4.6 {ratio} — ABNORMAL HIGH (ref 0.0–4.4)
Cholesterol, Total: 281 mg/dL — ABNORMAL HIGH (ref 100–199)
HDL: 61 mg/dL (ref >39)
LDL Chol Calc (NIH): 189 mg/dL — ABNORMAL HIGH (ref 0–99)
Triglycerides: 168 mg/dL — ABNORMAL HIGH (ref 0–149)
VLDL Cholesterol Cal: 31 mg/dL (ref 5–40)

## 2024-01-03 LAB — TSH RFX ON ABNORMAL TO FREE T4: TSH: 0.794 u[IU]/mL (ref 0.450–4.500)

## 2024-01-03 LAB — VITAMIN D 25 HYDROXY (VIT D DEFICIENCY, FRACTURES): Vit D, 25-Hydroxy: 12.6 ng/mL — ABNORMAL LOW (ref 30.0–100.0)

## 2024-01-03 LAB — HEMOGLOBIN A1C
Est. average glucose Bld gHb Est-mCnc: 120 mg/dL
Hgb A1c MFr Bld: 5.8 % — ABNORMAL HIGH (ref 4.8–5.6)

## 2024-01-03 NOTE — Assessment & Plan Note (Signed)
Recent vit D level low, she has not been taking supplement, she will resume, we will recheck before next visit
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
## 2024-01-03 NOTE — Patient Instructions (Signed)
  Medication Instructions:  Your physician recommends that you continue on your current medications as directed. Please refer to the Current Medication list given to you today. --If you need a refill on any your medications before your next appointment, please call your pharmacy first. If no refills are authorized on file call the office.-- Lab Work: Your physician has recommended that you have lab work today: 1 week prior to appt  If you have labs (blood work) drawn today and your tests are completely normal, you will receive your results via MyChart message OR a phone call from our staff.  Please ensure you check your voicemail in the event that you authorized detailed messages to be left on a delegated number. If you have any lab test that is abnormal or we need to change your treatment, we will call you to review the results.   Follow-Up: Your next appointment:   Your physician recommends that you schedule a follow-up appointment in: 4-6 mth follow up  with Dr. de Peru  You will receive a text message or e-mail with a link to a survey about your care and experience with [place] today! We would greatly appreciate your feedback!   Thanks for letting us be apart of your health journey!!  Primary Care and Sports Medicine   Dr. Ceasar Mons Peru   We encourage you to activate your patient portal called "MyChart".  Sign up information is provided on this After Visit Summary.  MyChart is used to connect with patients for Virtual Visits (Telemedicine).  Patients are able to view lab/test results, encounter notes, upcoming appointments, etc.  Non-urgent messages can be sent to your provider as well. To learn more about what you can do with MyChart, please visit --  [email or web address].

## 2024-01-03 NOTE — Assessment & Plan Note (Addendum)
Routine HCM labs reviewed. HCM reviewed/discussed. Anticipatory guidance regarding healthy weight, lifestyle and choices given. Recommend healthy diet.  Recommend approximately 150 minutes/week of moderate intensity exercise Recommend regular dental and vision exams Always use seatbelt/lap and shoulder restraints Recommend using smoke alarms and checking batteries at least twice a year Recommend using sunscreen when outside Discussed colon cancer screening recommendations, options.  Patient would like to proceed with colonoscopy, referral placed previously Discussed recommendations for shingles vaccine Discussed tetanus immunization recommendations, patient is up-to-date Mammo UTD Recommend follow-up with OBGYN, patient to schedule

## 2024-01-03 NOTE — Assessment & Plan Note (Signed)
Still with elevated total and LDL. Discussed these, review lifestyle modifications, handout provided. Discussed considerations for statin Plan to recheck labs before next appointment
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
## 2024-01-08 ENCOUNTER — Ambulatory Visit (HOSPITAL_BASED_OUTPATIENT_CLINIC_OR_DEPARTMENT_OTHER): Payer: 59 | Admitting: Certified Nurse Midwife

## 2024-01-11 ENCOUNTER — Ambulatory Visit: Payer: 59 | Attending: Cardiovascular Disease | Admitting: Cardiovascular Disease

## 2024-01-14 ENCOUNTER — Encounter: Payer: Self-pay | Admitting: Cardiovascular Disease

## 2024-01-17 ENCOUNTER — Encounter (HOSPITAL_BASED_OUTPATIENT_CLINIC_OR_DEPARTMENT_OTHER): Payer: Self-pay

## 2024-01-31 ENCOUNTER — Ambulatory Visit (HOSPITAL_BASED_OUTPATIENT_CLINIC_OR_DEPARTMENT_OTHER): Payer: 59 | Admitting: Certified Nurse Midwife

## 2024-04-02 ENCOUNTER — Ambulatory Visit: Admitting: Cardiology

## 2024-04-09 ENCOUNTER — Encounter: Payer: Self-pay | Admitting: Cardiology

## 2024-04-09 ENCOUNTER — Ambulatory Visit: Attending: Cardiology | Admitting: Cardiology

## 2024-04-09 ENCOUNTER — Other Ambulatory Visit (HOSPITAL_COMMUNITY): Payer: Self-pay

## 2024-04-09 VITALS — BP 120/72 | HR 77 | Ht 62.0 in | Wt 187.2 lb

## 2024-04-09 DIAGNOSIS — R072 Precordial pain: Secondary | ICD-10-CM | POA: Diagnosis not present

## 2024-04-09 DIAGNOSIS — F17210 Nicotine dependence, cigarettes, uncomplicated: Secondary | ICD-10-CM | POA: Diagnosis not present

## 2024-04-09 DIAGNOSIS — E782 Mixed hyperlipidemia: Secondary | ICD-10-CM | POA: Diagnosis not present

## 2024-04-09 DIAGNOSIS — R0609 Other forms of dyspnea: Secondary | ICD-10-CM

## 2024-04-09 MED ORDER — NITROGLYCERIN 0.4 MG SL SUBL
0.4000 mg | SUBLINGUAL_TABLET | SUBLINGUAL | 3 refills | Status: AC | PRN
  Filled 2024-04-09: qty 25, 1d supply, fill #0

## 2024-04-09 MED ORDER — ASPIRIN 81 MG PO TBEC: 81.0000 mg | DELAYED_RELEASE_TABLET | Freq: Every day | ORAL | Status: AC

## 2024-04-09 MED ORDER — ROSUVASTATIN CALCIUM 20 MG PO TABS
20.0000 mg | ORAL_TABLET | Freq: Every day | ORAL | 3 refills | Status: DC
  Filled 2024-04-09: qty 90, 90d supply, fill #0

## 2024-04-09 MED ORDER — NICOTINE POLACRILEX 2 MG MT GUM
2.0000 mg | CHEWING_GUM | OROMUCOSAL | 1 refills | Status: DC | PRN
  Filled 2024-04-09: qty 110, 30d supply, fill #0

## 2024-04-09 MED ORDER — METOPROLOL SUCCINATE ER 25 MG PO TB24
12.5000 mg | ORAL_TABLET | Freq: Every day | ORAL | 3 refills | Status: AC
  Filled 2024-04-09: qty 45, 90d supply, fill #0

## 2024-04-09 NOTE — Progress Notes (Signed)
 Cardiology Office Note:  .   Date:  04/09/2024  ID:  Yaritzi, Craun [birth year], MRN [id number] PCP: de Peru, Raymond J, MD  [facility] Providers Cardiologist:  Fransico Ivy, MD PCP: de [place], Alonza Jansky, MD  Chief Complaint  Patient presents with   Establish Care   Chest Pain     Angelica Miller is a 58 y.o. female with mixed hyperlipidemia, smoker, with exertional dyspnea and chest pain  Discussed the use of AI scribe software for clinical note transcription with the patient, who gave verbal consent to proceed.  History of Present Illness Angelica Miller is a 58 year old female with a history of heart block who presents with chest pain and shortness of breath.  Chest pain and shortness of breath occur daily, particularly with exertion, and improve with rest. The pain is persistent but not severe and worsens with long-distance walking. She experiences fatigue and easy shortness of breath.  She smokes one Black and Mild cigar daily and is attempting to quit. Her cholesterol remains elevated despite dietary changes and medication, and she is not currently on cholesterol-lowering medications. She is active at work, producing small batteries for electric cars.  Per patient provided history and chart review, it appears that she was seen by cardiologist in [place]  in 2019.  EKG showed long.  AV block, echocardiogram showed normal EF and structurally normal heart.  Stress test did show moderate size, mild inferolateral ischemia, with normal stress EF.    Vitals:   04/09/24 0913  BP: 120/72  Pulse: 77  SpO2: 97%      Review of Systems  Cardiovascular:  Positive for chest pain and dyspnea on exertion. Negative for leg swelling, palpitations and syncope.        Studies Reviewed: Angelica Miller        EKG 04/09/2024: Normal sinus rhythm with sinus arrhythmia Minimal voltage criteria for LVH, may be normal variant ( R in aVL ) When compared with ECG  of 06-Oct-2023 06:19, No significant change was found    Independently interpreted 12/2023:0 Chol 281, TG 168, HDL 61, LDL 189 HbA1C 5.8% Hb 13.8 Cr 0.7 TSH 0.7   Physical Exam Vitals and nursing note reviewed.  Constitutional:      General: She is not in acute distress. Neck:     Vascular: No JVD.  Cardiovascular:     Rate and Rhythm: Normal rate and regular rhythm.     Heart sounds: Normal heart sounds. No murmur heard. Pulmonary:     Effort: Pulmonary effort is normal.     Breath sounds: Normal breath sounds. No wheezing or rales.  Musculoskeletal:     Right lower leg: No edema.     Left lower leg: No edema.      VISIT DIAGNOSES:   ICD-10-CM   1. Precordial pain  R07.2 ECHOCARDIOGRAM COMPLETE    Myocardial Perfusion Imaging    Cardiac Stress Test: Informed Consent Details: Physician/Practitioner Attestation; Transcribe to consent form and obtain patient signature    2. Mixed hyperlipidemia  E78.2 EKG 12-Lead    Lipid panel    Lipoprotein A (LPA)    Lipid panel    Lipoprotein A (LPA)    3. Exertional dyspnea  R06.09 ECHOCARDIOGRAM COMPLETE    Myocardial Perfusion Imaging    Cardiac Stress Test: Informed Consent Details: Physician/Practitioner Attestation; Transcribe to consent form and obtain patient signature    4. Cigarette nicotine dependence without complication  F17.210  Angelica Miller is a 58 y.o. female with mixed hyperlipidemia, smoker, with exertional dyspnea and chest pain  Assessment & Plan Exertional dyspnea and chest pain: Intermittent chest pain and dyspnea on exertion, relieved by rest, suggesting possible coronary artery stenosis. Previous stress test in 2019 indicated inferolateral ischemia.  Further evaluation with diagnostic testing and medical management planned. - Prescribe aspirin  81 mg daily. - Prescribe metoprolol succinate 12.5 mg at night. - Prescribe nitroglycerin for episodes of chest pain not relieved by rest after  5 minutes. - Order echocardiogram and exercise nuclear stress test. - Advise adequate hydration to prevent orthostatic hypotension.  Mixed hyperlipidemia: Chronic hyperlipidemia with persistently elevated cholesterol levels despite dietary management and medication. Initiating statin therapy to reduce cholesterol levels and mitigate cardiovascular risk. - Prescribe Crestor 20 mg daily. - Advise heart-healthy Mediterranean diet rich in olive oil, fruits, vegetables, whole grains, nuts, and fish, with reduced red meat and high saturated fat dairy. - Check lipid panel and lipoprotein A in 2-3 weeks.  Nicotine dependence: Tobacco cessation counseling: - Currently smoking 1 cigar/day   - Patient was informed of the dangers of tobacco abuse including stroke, cancer, and MI, as well as benefits of tobacco cessation. - Patient is willing to quit at this time. - Approximately 5 mins were spent counseling patient cessation techniques. We discussed various methods to help quit smoking, including deciding on a date to quit, joining a support group, pharmacological agents. Patient would like to use nicotine gum. - I will reassess her progress at the next follow-up visit     Informed Consent   Shared Decision Making/Informed Consent The risks [chest pain, shortness of breath, cardiac arrhythmias, dizziness, blood pressure fluctuations, myocardial infarction, stroke/transient ischemic attack, nausea, vomiting, allergic reaction, radiation exposure, metallic taste sensation and life-threatening complications (estimated to be 1 in 10,000)], benefits (risk stratification, diagnosing coronary artery disease, treatment guidance) and alternatives of a nuclear stress test were discussed in detail with Ms. Hinesley and she agrees to proceed.       Meds ordered this encounter  Medications   nicotine polacrilex (NICORETTE) 2 MG gum    Sig: Chew 1 each (2 mg total) by mouth as needed for smoking cessation. See  package for instructions.    Dispense:  110 tablet    Refill:  1   rosuvastatin (CRESTOR) 20 MG tablet    Sig: Take 1 tablet (20 mg total) by mouth daily.    Dispense:  90 tablet    Refill:  3   metoprolol succinate (TOPROL-XL) 25 MG 24 hr tablet    Sig: Take 1/2 tablet (12.5 mg total) by mouth daily.    Dispense:  45 tablet    Refill:  3   nitroGLYCERIN (NITROSTAT) 0.4 MG SL tablet    Sig: Place 1 tablet (0.4 mg total) under the tongue every 5 (five) minutes as needed for chest pain.    Dispense:  30 tablet    Refill:  3   aspirin  EC 81 MG tablet    Sig: Take 1 tablet (81 mg total) by mouth daily. Swallow whole.     F/u in 4 weeks  Signed, Cody Das, MD

## 2024-04-09 NOTE — Patient Instructions (Signed)
 Medication Instructions:  START Aspirin  81 mg  START AS NEEDED FOR CHEST PAIN: Nitroglycerin START Metoprolol Succinate 12.5 mg START Crestor 20  START Nicotine gum  *If you need a refill on your cardiac medications before your next appointment, please call your pharmacy*  Lab Work IN 2-3 WEEKS: Lipid panel  LPA   If you have labs (blood work) drawn today and your tests are completely normal, you will receive your results only by: MyChart Message (if you have MyChart) OR A paper copy in the mail If you have any lab test that is abnormal or we need to change your treatment, we will call you to review the results.  Testing/Procedures: ECHO  Your physician has requested that you have an echocardiogram. Echocardiography is a painless test that uses sound waves to create images of your heart. It provides your doctor with information about the size and shape of your heart and how well your heart's chambers and valves are working. This procedure takes approximately one hour. There are no restrictions for this procedure. Please do NOT wear cologne, perfume, aftershave, or lotions (deodorant is allowed). Please arrive 15 minutes prior to your appointment time.  Please note: We ask at that you not bring children with you during ultrasound (echo/ vascular) testing. Due to room size and safety concerns, children are not allowed in the ultrasound rooms during exams. Our front office staff cannot provide observation of children in our lobby area while testing is being conducted. An adult accompanying a patient to their appointment will only be allowed in the ultrasound room at the discretion of the ultrasound technician under special circumstances. We apologize for any inconvenience.  EXERCISE NUCLEAR STRESS TEST  Your physician has requested that you have en exercise stress myoview. For further information please visit [email or web address] Please follow instruction sheet, as given.   Follow-Up: At  [facility], you and your health needs are our priority.  As part of our continuing mission to provide you with exceptional heart care, our providers are all part of one team.  This team includes your primary Cardiologist (physician) and Advanced Practice Providers or APPs (Physician Assistants and Nurse Practitioners) who all work together to provide you with the care you need, when you need it.  Your next appointment:   4-6 WEEKS   Provider:   One of our Advanced Practice Providers (APPs): Melita Springer, PA-C  Friddie Jetty, NP Evaline Hill, NP  Theotis Flake, PA-C Lawana Pray, NP  Willis Harter, PA-C Lovette Rud, PA-C  [place], PA-C Ernest Dick, NP  Marlana Silvan, NP Marcie Sever, PA-C  Laquita Plant, PA-C    Dayna Dunn, PA-C  Scott Weaver, PA-C Palmer Bobo, NP Katlyn West, NP Callie Goodrich, PA-C  Evan Williams, PA-C Sheng Haley, PA-C  Xika Zhao, NP Kathleen Johnson, PA-C

## 2024-04-10 DIAGNOSIS — E782 Mixed hyperlipidemia: Secondary | ICD-10-CM | POA: Diagnosis not present

## 2024-04-11 ENCOUNTER — Ambulatory Visit: Payer: Self-pay | Admitting: Cardiology

## 2024-04-11 DIAGNOSIS — E782 Mixed hyperlipidemia: Secondary | ICD-10-CM

## 2024-04-11 LAB — LIPID PANEL
Chol/HDL Ratio: 5.7 ratio — ABNORMAL HIGH (ref 0.0–4.4)
Cholesterol, Total: 277 mg/dL — ABNORMAL HIGH (ref 100–199)
HDL: 49 mg/dL (ref >39)
LDL Chol Calc (NIH): 178 mg/dL — ABNORMAL HIGH (ref 0–99)
Triglycerides: 263 mg/dL — ABNORMAL HIGH (ref 0–149)
VLDL Cholesterol Cal: 50 mg/dL — ABNORMAL HIGH (ref 5–40)

## 2024-04-11 LAB — LIPOPROTEIN A (LPA): Lipoprotein (a): 188.5 nmol/L — ABNORMAL HIGH (ref <75.0)

## 2024-04-11 NOTE — Progress Notes (Signed)
Thanks MJP  

## 2024-04-11 NOTE — Progress Notes (Signed)
 Lipid panel and lipoprotein were supposed to be rechecked in 3 months.  I wonder if they got checked yesterday instead.  Please place separate order for lipid panel to be checked in August.  No need to check lipoprotein a again as it was checked yesterday, result is pending.  Thanks MJP

## 2024-04-16 ENCOUNTER — Telehealth (HOSPITAL_COMMUNITY): Payer: Self-pay | Admitting: *Deleted

## 2024-04-16 ENCOUNTER — Other Ambulatory Visit: Payer: Self-pay | Admitting: Cardiology

## 2024-04-16 ENCOUNTER — Telehealth: Payer: Self-pay | Admitting: Cardiology

## 2024-04-16 DIAGNOSIS — R0609 Other forms of dyspnea: Secondary | ICD-10-CM

## 2024-04-16 DIAGNOSIS — R072 Precordial pain: Secondary | ICD-10-CM

## 2024-04-16 NOTE — Telephone Encounter (Signed)
Spoke to patient PharmD's advice given. °

## 2024-04-16 NOTE — Telephone Encounter (Signed)
 No absolute contraindications to pain meds, but consider holding Aspirin  if she would be taking NSAIDS for pain.  Hanks MJP

## 2024-04-16 NOTE — Telephone Encounter (Signed)
 Forwarding to pharmD for confirmation that pt should be able to take any OTC pain medication for her toothache.......Aaron Aas

## 2024-04-16 NOTE — Telephone Encounter (Signed)
 Pt would like a c/b regarding what OTC pain medications she is able to take for a toothache being that she is on heart medications. Please advise

## 2024-04-16 NOTE — Telephone Encounter (Signed)
 Patient given detailed instructions per Myocardial Perfusion Study Information Sheet for the test on 04/23/24 Patient notified to arrive 15 minutes early and that it is imperative to arrive on time for appointment to keep from having the test rescheduled.  If you need to cancel or reschedule your appointment, please call the office within 24 hours of your appointment. . Patient verbalized understanding. [place] Bees

## 2024-04-16 NOTE — Telephone Encounter (Signed)
 Please review and advise.

## 2024-04-16 NOTE — Telephone Encounter (Signed)
 Pain from chest or tooth? I did prescribe SL NTG for chest pain. If chest pain severe and not responding to NTG, recommend ER evaluation. If patient is concerned about toothache, I would defer to her PCP or dentist.  Thanks MJP

## 2024-04-23 ENCOUNTER — Ambulatory Visit (HOSPITAL_COMMUNITY)
Admission: RE | Admit: 2024-04-23 | Discharge: 2024-04-23 | Disposition: A | Discharge status: Home / Self Care | Source: Ambulatory Visit | Attending: Cardiology | Admitting: Cardiology

## 2024-04-23 DIAGNOSIS — R072 Precordial pain: Secondary | ICD-10-CM | POA: Insufficient documentation

## 2024-04-23 DIAGNOSIS — R0609 Other forms of dyspnea: Secondary | ICD-10-CM | POA: Insufficient documentation

## 2024-04-23 LAB — MYOCARDIAL PERFUSION IMAGING
Angina Index: 0
Duke Treadmill Score: 7
Estimated workload: 7.7
Exercise duration (min): 6 min
Exercise duration (sec): 30 s
LV dias vol: 113 mL (ref 46–106)
LV sys vol: 51 mL
MPHR: 163 {beats}/min
Nuc Stress EF: 55 %
Peak HR: 144 {beats}/min
Percent HR: 88 %
Rest HR: 62 {beats}/min
Rest Nuclear Isotope Dose: 10.2 mCi
SDS: 0
SRS: 2
SSS: 1
ST Depression (mm): 0 mm
Stress Nuclear Isotope Dose: 30.8 mCi
TID: 0.95

## 2024-04-23 MED ORDER — TECHNETIUM TC 99M TETROFOSMIN IV KIT
30.8000 | PACK | Freq: Once | INTRAVENOUS | Status: AC | PRN
  Administered 2024-04-23: 30.8 via INTRAVENOUS

## 2024-04-23 MED ORDER — TECHNETIUM TC 99M TETROFOSMIN IV KIT
10.2000 | PACK | Freq: Once | INTRAVENOUS | Status: AC | PRN
  Administered 2024-04-23: 10.2 via INTRAVENOUS

## 2024-04-28 ENCOUNTER — Ambulatory Visit: Payer: Self-pay | Admitting: Cardiology

## 2024-04-28 NOTE — Progress Notes (Signed)
 Surprisingly, no significant abnormalities noted on stress test. If symptoms of chest pain remain, I would recommend coronary CT angiogram. Continue current medications.  Thanks MJP

## 2024-05-05 ENCOUNTER — Telehealth (HOSPITAL_BASED_OUTPATIENT_CLINIC_OR_DEPARTMENT_OTHER): Payer: Self-pay | Admitting: *Deleted

## 2024-05-05 ENCOUNTER — Ambulatory Visit (HOSPITAL_COMMUNITY)
Admission: EM | Admit: 2024-05-05 | Discharge: 2024-05-05 | Disposition: A | Discharge status: Home / Self Care | Attending: Emergency Medicine | Admitting: Emergency Medicine

## 2024-05-05 ENCOUNTER — Encounter (HOSPITAL_COMMUNITY): Payer: Self-pay

## 2024-05-05 DIAGNOSIS — H6123 Impacted cerumen, bilateral: Secondary | ICD-10-CM | POA: Diagnosis not present

## 2024-05-05 DIAGNOSIS — J4521 Mild intermittent asthma with (acute) exacerbation: Secondary | ICD-10-CM | POA: Diagnosis not present

## 2024-05-05 DIAGNOSIS — J069 Acute upper respiratory infection, unspecified: Secondary | ICD-10-CM

## 2024-05-05 MED ORDER — CARBAMIDE PEROXIDE 6.5 % OT SOLN: 5.0000 [drp] | Freq: Two times a day (BID) | OTIC | 0 refills | Status: AC

## 2024-05-05 MED ORDER — AZELASTINE HCL 0.1 % NA SOLN: 2.0000 | Freq: Two times a day (BID) | NASAL | 0 refills | Status: DC

## 2024-05-05 MED ORDER — BENZONATATE 100 MG PO CAPS: 100.0000 mg | ORAL_CAPSULE | Freq: Three times a day (TID) | ORAL | 0 refills | Status: DC

## 2024-05-05 MED ORDER — PROMETHAZINE-DM 6.25-15 MG/5ML PO SYRP: 5.0000 mL | ORAL_SOLUTION | Freq: Every evening | ORAL | 0 refills | Status: DC | PRN

## 2024-05-05 MED ORDER — PREDNISONE 20 MG PO TABS: 40.0000 mg | ORAL_TABLET | Freq: Every day | ORAL | 0 refills | Status: AC

## 2024-05-05 NOTE — Discharge Instructions (Addendum)
 As discussed I believe your symptoms are likely related to a viral respiratory illness that is exacerbated your chronic bronchitis/asthma. Start taking 2 tablets of prednisone  once daily for 5 days. Take Tessalon  every 8 hours as needed for cough. Take Promethazine DM cough syrup at bedtime as needed for cough.  This can make you drowsy so do not drive, work, or drink alcohol while taking this. You can continue to take Robitussin cough syrup as well. Use azelastine nasal spray twice daily to help with nasal congestion. Apply 5 drops of Debrox into both ears twice daily for the next 3 days to help with additional earwax relief. Continue to use albuterol  inhaler or breathing treatments as needed for any shortness of breath or wheezing. Otherwise you can take 650 mg of Tylenol  every 6-8 hours as needed for any pain or fever. Make sure you are staying hydrated and getting plenty of rest. Return here if your symptoms persist or worsen.

## 2024-05-05 NOTE — Telephone Encounter (Signed)
 Called patient form is like [employer]ML[employer] requested patient drop this off before appt that is scheduled 6/23 so provider could review and have filled out and ready when she comes in. Patient will drop off 6/18

## 2024-05-05 NOTE — ED Triage Notes (Signed)
 Patient here today with c/o cough, chest congestion, wheeze, SOB, fatigue, nasal congestion, and PND X 4 days. She has been taking Mucinex  and Robitussin with no relief. Coworker was also sick with a cough. No recent travel.

## 2024-05-05 NOTE — Telephone Encounter (Signed)
 Copied from CRM [id number]. Topic: Appointments - Appointment Scheduling >> May 05, 2024  3:26 PM Star [place] wrote: Patient needs follow up to urgent care appt for cough, congestion and headache, needs form filled out for work- [phone]
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
## 2024-05-12 ENCOUNTER — Ambulatory Visit (INDEPENDENT_AMBULATORY_CARE_PROVIDER_SITE_OTHER): Admitting: Family Medicine

## 2024-05-12 ENCOUNTER — Encounter (HOSPITAL_BASED_OUTPATIENT_CLINIC_OR_DEPARTMENT_OTHER): Payer: Self-pay | Admitting: *Deleted

## 2024-05-12 ENCOUNTER — Encounter (HOSPITAL_BASED_OUTPATIENT_CLINIC_OR_DEPARTMENT_OTHER): Payer: Self-pay | Admitting: Family Medicine

## 2024-05-12 VITALS — BP 124/80 | HR 74 | Ht 62.0 in | Wt 192.6 lb

## 2024-05-12 DIAGNOSIS — G43909 Migraine, unspecified, not intractable, without status migrainosus: Secondary | ICD-10-CM

## 2024-05-12 DIAGNOSIS — J45909 Unspecified asthma, uncomplicated: Secondary | ICD-10-CM | POA: Diagnosis not present

## 2024-05-12 DIAGNOSIS — Z1211 Encounter for screening for malignant neoplasm of colon: Secondary | ICD-10-CM

## 2024-05-12 NOTE — Progress Notes (Signed)
    Procedures performed today:    None.  Independent interpretation of notes and tests performed by another provider:   None.  Brief History, Exam, Impression, and Recommendations:    BP 124/80 (BP Location: Right Arm, Patient Position: Sitting, Cuff Size: Normal)   Pulse 74   Ht 5' 2 (1.575 m)   Wt 192 lb 9.6 oz (87.4 kg)   LMP  (LMP Unknown)   SpO2 98%   BMI 35.23 kg/m   Uncomplicated asthma, unspecified asthma severity, unspecified whether persistent Assessment & Plan: Patient with recent illness which led to urgent care visit.  She reports history of asthma and was also diagnosed with asthma exacerbation during recent visit to urgent care.  She indicates that she has been taking medications as prescribed from urgent care, but still continues to have cough.  Patient also with history of tobacco use. Given ongoing symptoms, lack of prior evaluation with history of cough, tobacco use, feel that she would benefit from evaluation with pulmonology, likely with PFTs to be completed.  She would like to proceed with referral, referral to pulmonology today  Orders: -     Pulmonary Visit  Special screening for malignant neoplasms, colon -     Ambulatory referral to Gastroenterology  Migraine without status migrainosus, not intractable, unspecified migraine type Assessment & Plan: Patient was previously referred to neurology, but she had to reschedule appointment.  Did have appointment scheduled to see them to establish, however provider had to cancel that appointment.  She she unfortunately never did call the office back to reschedule or receive a call from the office to reschedule and thus did not have evaluation.  She continues to have intermittent headaches and ultimately would like to have evaluation with neurology.  New referral placed today to help with arranging appointment  Orders: -     Ambulatory referral to Neurology  Return in about 3 months (around 08/12/2024).  Spent  37 minutes on this patient encounter, including preparation, chart review, face-to-face counseling with patient and coordination of care, and documentation of encounter  ___________________________________________ Rondell Frick de [place], MD, ABFM, [facility]

## 2024-05-12 NOTE — Patient Instructions (Signed)
  Medication Instructions:  Your physician recommends that you continue on your current medications as directed. Please refer to the Current Medication list given to you today. --If you need a refill on any your medications before your next appointment, please call your pharmacy first. If no refills are authorized on file call the office.-- Lab Work: Your physician has recommended that you have lab work today: 1 week before next visit  If you have labs (blood work) drawn today and your tests are completely normal, you will receive your results via MyChart message OR a phone call from our staff.  Please ensure you check your voicemail in the event that you authorized detailed messages to be left on a delegated number. If you have any lab test that is abnormal or we need to change your treatment, we will call you to review the results.   Follow-Up: Your next appointment:   Your physician recommends that you schedule a follow-up appointment in: 3 month follow up with Dr. de [place]  You will receive a text message or e-mail with a link to a survey about your care and experience with [place] today! We would greatly appreciate your feedback!   Thanks for letting us be apart of your health journey!!  Primary Care and Sports Medicine   Dr. Ceasar Mons [place]   We encourage you to activate your patient portal called "MyChart".  Sign up information is provided on this After Visit Summary.  MyChart is used to connect with patients for Virtual Visits (Telemedicine).  Patients are able to view lab/test results, encounter notes, upcoming appointments, etc.  Non-urgent messages can be sent to your provider as well. To learn more about what you can do with MyChart, please visit --  [email or web address].

## 2024-05-16 ENCOUNTER — Ambulatory Visit (HOSPITAL_COMMUNITY)

## 2024-05-16 ENCOUNTER — Telehealth (HOSPITAL_COMMUNITY): Payer: Self-pay | Admitting: Cardiology

## 2024-05-16 NOTE — Telephone Encounter (Signed)
 Patient cancelled echocardiogram for reason below:  05/16/2024 2:50 PM Ab:TPODNW, JASMIN B  Cancel Rsn: Conflict/Need to Reschedule (Patient sick - pneumonia. Will call back to reschedule   Order will be removed from the echo WQ. Thank you.

## 2024-05-19 ENCOUNTER — Encounter (HOSPITAL_BASED_OUTPATIENT_CLINIC_OR_DEPARTMENT_OTHER): Payer: Self-pay

## 2024-05-19 NOTE — Progress Notes (Deleted)
 Cardiology Office Note    Date:  05/19/2024  ID:  Hero, Mccathern 05/23/1966, MRN [id number] PCP:  de [place], Raymond J, MD  Cardiologist:  None  Electrophysiologist:  None   Chief Complaint: ***  History of Present Illness: SABRA    Angelica Miller is a 58 y.o. female with visit-pertinent history of heart block, asthma, shortness of breath and chest pain.  Patient establish care with Dr. Elmira on 04/09/2024 for evaluation of chest pain and shortness of breath.  She reported the chest pain and shortness breath occurred daily, particular with exertion improved with rest.  She reported the pain was persistent but not severe and worsened with long distance walking.  She experienced fatigue and easy shortness of breath.  Patient reported that she smokes 1 black and mild cigars daily and was attempting to quit.  Per Dr. Gilda note she was seen by a cardiologist in [place]  in 2019.  EKG showed long AV block, echocardiogram showed normal EF and structurally normal heart.  Stressed test did show moderate size, mild inferolateral ischemia with normal stress EF.  It was recommended that patient undergo echocardiogram and nuclear stress test.  MPI on 04/23/2024 was normal and low risk.  LV perfusion was normal with no evidence of ischemia or infarction.  Nuclear stress EF 55%, LVEF was normal at 55 to 65%.  Today she presents for follow up. She reports that she  DOE/Chest pain:  Mixed hyperlipidemia:  Nicotine  dependence:   Labwork independently reviewed:   ROS: .   *** denies chest pain, shortness of breath, lower extremity edema, fatigue, palpitations, melena, hematuria, hemoptysis, diaphoresis, weakness, presyncope, syncope, orthopnea, and PND.  All other systems are reviewed and otherwise negative.  Studies Reviewed: SABRA    EKG:  EKG is ordered today, personally reviewed, demonstrating ***     CV Studies: Cardiac studies reviewed are outlined and summarized above.  Otherwise please see EMR for full report. Cardiac Studies & Procedures   ______________________________________________________________________________________________   STRESS TESTS  MYOCARDIAL PERFUSION IMAGING 04/23/2024  Narrative   The study is normal. The study is low risk.   No ST deviation was noted.   LV perfusion is normal. There is no evidence of ischemia. There is no evidence of infarction.   Left ventricular function is normal. Nuclear stress EF: 55%. The left ventricular ejection fraction is normal (55-65%). End diastolic cavity size is normal. End systolic cavity size is normal.   CT images were obtained for attenuation correction and were examined for the presence of coronary calcium  when appropriate.   Coronary calcium  was absent on the attenuation correction CT images.   Prior study not available for comparison.   Normal ECG and hemodynamic respnose to exercise stress            ______________________________________________________________________________________________       Current Reported Medications:.    No outpatient medications have been marked as taking for the 05/21/24 encounter (Appointment) with Ranjit Ashurst D, NP.    Physical Exam:    VS:  LMP  (LMP Unknown)    Wt Readings from Last 3 Encounters:  05/12/24 192 lb 9.6 oz (87.4 kg)  04/23/24 187 lb (84.8 kg)  04/09/24 187 lb 3.2 oz (84.9 kg)    GEN: Well nourished, well developed in no acute distress NECK: No JVD; No carotid bruits CARDIAC: ***RRR, no murmurs, rubs, gallops RESPIRATORY:  Clear to auscultation without rales, wheezing or rhonchi  ABDOMEN: Soft, non-tender, non-distended EXTREMITIES:  No  edema; No acute deformity     Asessement and Plan:.     ***     Disposition: F/u with ***  Signed, Calianne Larue D Kynadi Dragos, NP

## 2024-05-21 ENCOUNTER — Ambulatory Visit: Admitting: Cardiology

## 2024-05-21 DIAGNOSIS — E782 Mixed hyperlipidemia: Secondary | ICD-10-CM

## 2024-05-21 DIAGNOSIS — R072 Precordial pain: Secondary | ICD-10-CM

## 2024-05-21 DIAGNOSIS — R0609 Other forms of dyspnea: Secondary | ICD-10-CM

## 2024-05-21 DIAGNOSIS — F17210 Nicotine dependence, cigarettes, uncomplicated: Secondary | ICD-10-CM

## 2024-05-21 NOTE — Telephone Encounter (Signed)
 Called to F/U after recent encounter. Pt states she is doing much better and no longer has any upper abdominal pain.  Pt states she still has a mild cough at times, but that the coughing is significantly improved.  Pt had no further complaints or questions at this time.    Alm Hollering RN

## 2024-05-22 NOTE — Assessment & Plan Note (Signed)
 Patient was previously referred to neurology, but she had to reschedule appointment.  Did have appointment scheduled to see them to establish, however provider had to cancel that appointment.  She she unfortunately never did call the office back to reschedule or receive a call from the office to reschedule and thus did not have evaluation.  She continues to have intermittent headaches and ultimately would like to have evaluation with neurology.  New referral placed today to help with arranging appointment

## 2024-05-22 NOTE — Assessment & Plan Note (Signed)
 Patient with recent illness which led to urgent care visit.  She reports history of asthma and was also diagnosed with asthma exacerbation during recent visit to urgent care.  She indicates that she has been taking medications as prescribed from urgent care, but still continues to have cough.  Patient also with history of tobacco use. Given ongoing symptoms, lack of prior evaluation with history of cough, tobacco use, feel that she would benefit from evaluation with pulmonology, likely with PFTs to be completed.  She would like to proceed with referral, referral to pulmonology today

## 2024-05-26 ENCOUNTER — Telehealth (HOSPITAL_BASED_OUTPATIENT_CLINIC_OR_DEPARTMENT_OTHER): Payer: Self-pay | Admitting: *Deleted

## 2024-05-26 NOTE — Telephone Encounter (Signed)
 Routing to both [place] and Dr. De [place] for review.

## 2024-05-26 NOTE — Telephone Encounter (Signed)
 Spoke with Patient she needs this filled out for short term disability for dates 6/12 through 6/24 for pneumonia and bronchitis

## 2024-05-26 NOTE — Telephone Encounter (Signed)
 Copied from CRM [id number]. Topic: General - Other >> May 26, 2024  1:21 PM Edsel HERO wrote: Patient called to see if her FMLA paperwork had been faxed to her employer yet. Patient states they need it back as soon as possible. Please advise.

## 2024-05-28 ENCOUNTER — Encounter (HOSPITAL_BASED_OUTPATIENT_CLINIC_OR_DEPARTMENT_OTHER): Payer: Self-pay | Admitting: Family Medicine

## 2024-05-28 ENCOUNTER — Telehealth (HOSPITAL_BASED_OUTPATIENT_CLINIC_OR_DEPARTMENT_OTHER): Payer: Self-pay | Admitting: *Deleted

## 2024-05-28 DIAGNOSIS — Z0279 Encounter for issue of other medical certificate: Secondary | ICD-10-CM

## 2024-05-28 NOTE — Telephone Encounter (Signed)
 Pt states really needs to have disability paperwork done as soon as possible, by tomorrow, or her claim will be dismissed

## 2024-05-28 NOTE — Telephone Encounter (Signed)
 Please see other encounter.

## 2024-05-28 NOTE — Telephone Encounter (Signed)
 Copied from CRM [id number]. Topic: General - Other >> May 28, 2024 12:35 PM Nathanel BROCKS wrote: Reason for CRM: pt called about her disability paperwork that needs to be faxed over no later than 07/11 to [facility]. Please call patient and advise. [phone].

## 2024-05-29 NOTE — Telephone Encounter (Signed)
 My chart msg sent to patient.

## 2024-07-10 ENCOUNTER — Telehealth (HOSPITAL_BASED_OUTPATIENT_CLINIC_OR_DEPARTMENT_OTHER): Payer: Self-pay | Admitting: Family Medicine

## 2024-07-10 ENCOUNTER — Ambulatory Visit: Payer: Self-pay

## 2024-07-10 ENCOUNTER — Other Ambulatory Visit: Payer: Self-pay

## 2024-07-10 ENCOUNTER — Other Ambulatory Visit (HOSPITAL_BASED_OUTPATIENT_CLINIC_OR_DEPARTMENT_OTHER): Payer: Self-pay | Admitting: *Deleted

## 2024-07-10 ENCOUNTER — Emergency Department (HOSPITAL_BASED_OUTPATIENT_CLINIC_OR_DEPARTMENT_OTHER)
Admission: EM | Admit: 2024-07-10 | Discharge: 2024-07-11 | Disposition: A | Discharge status: Home / Self Care | Attending: Emergency Medicine | Admitting: Emergency Medicine

## 2024-07-10 DIAGNOSIS — K921 Melena: Secondary | ICD-10-CM | POA: Insufficient documentation

## 2024-07-10 DIAGNOSIS — J45909 Unspecified asthma, uncomplicated: Secondary | ICD-10-CM | POA: Diagnosis not present

## 2024-07-10 DIAGNOSIS — Z7982 Long term (current) use of aspirin: Secondary | ICD-10-CM | POA: Diagnosis not present

## 2024-07-10 LAB — CBC
HCT: 41.4 % (ref 36.0–46.0)
Hemoglobin: 14 g/dL (ref 12.0–15.0)
MCH: 31.1 pg (ref 26.0–34.0)
MCHC: 33.8 g/dL (ref 30.0–36.0)
MCV: 92 fL (ref 80.0–100.0)
Platelets: 266 K/uL (ref 150–400)
RBC: 4.5 MIL/uL (ref 3.87–5.11)
RDW: 13.9 % (ref 11.5–15.5)
WBC: 7.5 K/uL (ref 4.0–10.5)
nRBC: 0 % (ref 0.0–0.2)

## 2024-07-10 LAB — COMPREHENSIVE METABOLIC PANEL WITH GFR
ALT: 12 U/L (ref 0–44)
AST: 21 U/L (ref 15–41)
Albumin: 4.1 g/dL (ref 3.5–5.0)
Alkaline Phosphatase: 80 U/L (ref 38–126)
Anion gap: 15 (ref 5–15)
BUN: 12 mg/dL (ref 6–20)
CO2: 21 mmol/L — ABNORMAL LOW (ref 22–32)
Calcium: 9.8 mg/dL (ref 8.9–10.3)
Chloride: 105 mmol/L (ref 98–111)
Creatinine, Ser: 0.74 mg/dL (ref 0.44–1.00)
GFR, Estimated: >60 mL/min (ref >60)
Glucose, Bld: 92 mg/dL (ref 70–99)
Potassium: 3.8 mmol/L (ref 3.5–5.1)
Sodium: 140 mmol/L (ref 135–145)
Total Bilirubin: 0.3 mg/dL (ref 0.0–1.2)
Total Protein: 7.4 g/dL (ref 6.5–8.1)

## 2024-07-10 NOTE — ED Triage Notes (Addendum)
 Bright red blood in stool. Since this morning. Generalized abd pain. Does not describe stool as hard or difficult.  No thinners.  Left knee arthritic pain.

## 2024-07-10 NOTE — Telephone Encounter (Signed)
 FYI Only or Action Required?: Action required by provider: refusing disposition, requesting script for meloxicam , plans to go to UC in morning after night shift.  Patient was last seen in primary care on 05/12/2024 by de Peru, Quintin PARAS, MD.  Called Nurse Triage reporting Knee Pain, Joint Swelling, thigh pain, and Dizziness.  Symptoms began several weeks ago.  Interventions attempted: OTC medications: OTC pain meds, Rest, hydration, or home remedies, and Ice/heat application.  Symptoms are: gradually worsening.  Triage Disposition: See Physician Within 4 Hours (or PCP Triage) (overriding See PCP When Office is Open (Within 3 Days))  Patient/caregiver understands and will follow disposition?: No, refuses disposition      Copied from CRM #[id number]. Topic: Clinical - Red Word Triage >> Jul 10, 2024  2:02 PM Berwyn MATSU wrote: Red Word that prompted transfer to Nurse Triage: swelling in knees and pain. Reason for Disposition  MILD or MODERATE swelling (e.g., can't move joint normally, can't do usual activities) (Exceptions: Itchy, localized swelling; swelling is chronic.)  Answer Assessment - Initial Assessment Questions 1. LOCATION: Where is the swelling located?  (e.g., left, right, both knees)     Both knees 2. ONSET: When did the swelling start? Does it come and go, or is it there all the time?     2 weeks, arthritis recurrent 3. SWELLING: How bad is the swelling? Or, How large is it? (e.g., mild, moderate, severe; size of localized swelling)      Really bad 4. PAIN: Is there any pain? If Yes, ask: How bad is it? (Scale 0-10; or none, mild, moderate, severe)     Killing me right now, sharp pains going through knees 9/10 5. SETTING: Has there been any recent work, exercise or other activity that involved that part of the body?      On feet for 12 hours at a time, can't sit down at work, concrete floor 6. AGGRAVATING FACTORS: What makes the knee swelling worse? (e.g.,  walking, climbing stairs, running)     Walking OTC meds helps little bit but not much Putting ice on it 7. ASSOCIATED SYMPTOMS: Is there any pain or redness?     pain 8. OTHER SYMPTOMS: Do you have any other symptoms? (e.g., calf pain, chest pain, difficulty breathing, fever)     Pains in thighs back of thighs, like straining pain no chest pain, SOB, fever Just feel like, can't even describe it Lightheaded sometimes but not a lot, new since pain came on,  No numbness/tingling Can move knees but hard to walk    Advised pt be examined today at [facility] or ED, no PCP availability, advised [facility], pt refusing exam today, pt states she will go to UC after work in the morning. Advised pt go to ED especially if any worsening. Sending message to PCP with pt request for script for meloxicam . They already know my issues, requesting meloxicam   Protocols used: Knee Swelling-A-AH

## 2024-07-10 NOTE — Telephone Encounter (Unsigned)
 Copied from CRM #[id number]. Topic: Clinical - Medication Refill >> Jul 10, 2024  2:02 PM Berwyn MATSU wrote: Medication: meloxicam  (MOBIC ) 15 MG tablet  Has the patient contacted their pharmacy? Yes (Agent: If no, request that the patient contact the pharmacy for the refill. If patient does not wish to contact the pharmacy document the reason why and proceed with request.) (Agent: If yes, when and what did the pharmacy advise?)  This is the patient's preferred pharmacy:  [facility] [phone] [address], [place] - [address] [place] [address] Phone: [phone] Fax: [phone]  Is this the correct pharmacy for this prescription? Yes If no, delete pharmacy and type the correct one.   Has the prescription been filled recently? No  Is the patient out of the medication? Yes  Has the patient been seen for an appointment in the last year OR does the patient have an upcoming appointment? Yes  Can we respond through MyChart? Yes  Agent: Please be advised that Rx refills may take up to 3 business days. We ask that you follow-up with your pharmacy.

## 2024-07-11 LAB — OCCULT BLOOD X 1 CARD TO LAB, STOOL: Fecal Occult Bld: NEGATIVE

## 2024-07-11 NOTE — Telephone Encounter (Signed)
 Scheduled 8/25 at 3:30.

## 2024-07-11 NOTE — Discharge Instructions (Signed)
 You were seen today with concern for bloody stool.  Your hemoglobin is normal and there is no evidence of bleeding at this time.  You may have an internal hemorrhoid.  Start Colace daily to make sure that your stools are soft.  If you develop significant abdominal pain or any new or worsening symptoms, you should be reevaluated.
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
## 2024-07-14 ENCOUNTER — Encounter (HOSPITAL_BASED_OUTPATIENT_CLINIC_OR_DEPARTMENT_OTHER): Payer: Self-pay | Admitting: Family Medicine

## 2024-07-14 ENCOUNTER — Ambulatory Visit (INDEPENDENT_AMBULATORY_CARE_PROVIDER_SITE_OTHER): Admitting: Family Medicine

## 2024-07-14 VITALS — BP 150/85 | HR 79 | Ht 62.0 in | Wt 188.4 lb

## 2024-07-14 DIAGNOSIS — K921 Melena: Secondary | ICD-10-CM | POA: Diagnosis not present

## 2024-07-14 DIAGNOSIS — Z1211 Encounter for screening for malignant neoplasm of colon: Secondary | ICD-10-CM | POA: Diagnosis not present

## 2024-07-14 DIAGNOSIS — J45909 Unspecified asthma, uncomplicated: Secondary | ICD-10-CM

## 2024-07-14 DIAGNOSIS — M170 Bilateral primary osteoarthritis of knee: Secondary | ICD-10-CM

## 2024-07-14 MED ORDER — MELOXICAM 7.5 MG PO TABS: 7.5000 mg | ORAL_TABLET | Freq: Every day | ORAL | 0 refills | Status: AC

## 2024-07-14 NOTE — Assessment & Plan Note (Signed)
 Does have appointment coming up with orthopedics.  She has utilized meloxicam  typically, prior prescription for 7.5 mg dose.  We did discuss considerations, risk to be aware of.  She had recent issue as above with blood in the stool, however this has resolved.  We can proceed with resuming meloxicam  7.5 mg, will follow-up on recommendations from orthopedic surgeon regarding treatment options

## 2024-07-14 NOTE — Assessment & Plan Note (Signed)
 Recent issue for patient, did have prior brief episode about 4 months ago.  Has had history of intermittent GI discomfort, however denies any specific rectal pain, particularly with recent episode of bleeding.  Notes that stool otherwise has appeared normal, blood was more so noted on stool/when wiping.  Had normal evaluation in the emergency department We reviewed considerations.  She is overdue for screening colonoscopy and referral was placed previously for this.  She has not scheduled as of yet, but would like to move forward with this.  New referral placed to assist with scheduling, patient also provided with contact information for GI office. We reviewed recommendations pertaining to conservative measures, discussed appropriate fiber intake, water intake, use of stool softener as needed

## 2024-07-14 NOTE — Patient Instructions (Signed)
  Medication Instructions:  Your physician recommends that you continue on your current medications as directed. Please refer to the Current Medication list given to you today. --If you need a refill on any your medications before your next appointment, please call your pharmacy first. If no refills are authorized on file call the office.--  Referrals/Procedures/Imaging: Gastroenterology   Follow-Up: Your next appointment:   Your physician recommends that you schedule a follow-up appointment in: 2-3 months follow up (move old appt)  with Dr. de Peru  You will receive a text message or e-mail with a link to a survey about your care and experience with us  today! We would greatly appreciate your feedback!   Thanks for letting us  be apart of your health journey!!  Primary Care and Sports Medicine   Dr. Quintin sheerer Peru   We encourage you to activate your patient portal called MyChart.  Sign up information is provided on this After Visit Summary.  MyChart is used to connect with patients for Virtual Visits (Telemedicine).  Patients are able to view lab/test results, encounter notes, upcoming appointments, etc.  Non-urgent messages can be sent to your provider as well. To learn more about what you can do with MyChart, please visit --  [email or web address].

## 2024-07-14 NOTE — Assessment & Plan Note (Signed)
 Previously referred to pulmonology, she does have appointment scheduled within the next 1 to 2 weeks.  Recommend continuing with scheduled appointment in order to proceed with further evaluation and recommendations

## 2024-07-14 NOTE — Progress Notes (Signed)
 Procedures performed today:    None.  Independent interpretation of notes and tests performed by another provider:   None.  Brief History, Exam, Impression, and Recommendations:    BP (!) 150/85 (BP Location: Right Arm, Patient Position: Sitting, Cuff Size: Normal)   Pulse 79   Ht 5' 2 (1.575 m)   Wt 188 lb 6.4 oz (85.5 kg)   SpO2 98%   BMI 34.46 kg/m   Discussed the use of AI scribe software for clinical note transcription with the patient, who gave verbal consent to proceed.  History of Present Illness Angelica Miller is a 58 year old female who presents with a recent episode of bloody stool.  She experienced bloody stool approximately four days ago, which lasted about 24 hours and was accompanied by significant abdominal pain, but no rectal pain. No further episodes have occurred since then. This is the second occurrence, with a similar episode happening about four months ago, which resolved spontaneously. No current bloody stool, rectal pain, or significant straining with bowel movements.  She has a history of acid reflux, acid hernia, and gastroenteritis. She experiences severe acid reflux, particularly in the mornings, which limits her ability to drink water early in the day. Drinking water in the morning causes stomach pain, and she prefers to drink orange juice instead. Bowel movements are sometimes small, hard balls, and she occasionally experiences significant abdominal pain during bowel movements. She reports that she does not drink enough water.  She is currently taking meloxicam  for knee pain, which she experiences due to standing for long periods at work. She reports swelling in her knees, particularly in one knee, and has run out of her medication. She works long hours, standing from 7 PM to 7 AM, which affects her knees.  Blood in stool Assessment & Plan: Recent issue for patient, did have prior brief episode about 4 months ago.  Has had history of  intermittent GI discomfort, however denies any specific rectal pain, particularly with recent episode of bleeding.  Notes that stool otherwise has appeared normal, blood was more so noted on stool/when wiping.  Had normal evaluation in the emergency department We reviewed considerations.  She is overdue for screening colonoscopy and referral was placed previously for this.  She has not scheduled as of yet, but would like to move forward with this.  New referral placed to assist with scheduling, patient also provided with contact information for GI office. We reviewed recommendations pertaining to conservative measures, discussed appropriate fiber intake, water intake, use of stool softener as needed   Special screening for malignant neoplasms, colon -     Ambulatory referral to Gastroenterology  Osteoarthritis of both knees, unspecified osteoarthritis type Assessment & Plan: Does have appointment coming up with orthopedics.  She has utilized meloxicam  typically, prior prescription for 7.5 mg dose.  We did discuss considerations, risk to be aware of.  She had recent issue as above with blood in the stool, however this has resolved.  We can proceed with resuming meloxicam  7.5 mg, will follow-up on recommendations from orthopedic surgeon regarding treatment options   Uncomplicated asthma, unspecified asthma severity, unspecified whether persistent Assessment & Plan: Previously referred to pulmonology, she does have appointment scheduled within the next 1 to 2 weeks.  Recommend continuing with scheduled appointment in order to proceed with further evaluation and recommendations   Other orders -     Meloxicam ; Take 1 tablet (7.5 mg total) by mouth daily.  Dispense: 30 tablet;  Refill: 0  Return in about 3 months (around 10/14/2024).  Spent 32 minutes on this patient encounter, including preparation, chart review, face-to-face counseling with patient and coordination of care, and documentation of  encounter   ___________________________________________ Johana Hopkinson de Peru, MD, ABFM, [facility] Primary Care and Sports Medicine [facility]

## 2024-07-25 ENCOUNTER — Ambulatory Visit: Admitting: Physician Assistant

## 2024-07-25 ENCOUNTER — Ambulatory Visit: Admitting: Pulmonary Disease

## 2024-07-28 ENCOUNTER — Ambulatory Visit: Admitting: Pulmonary Disease

## 2024-07-29 ENCOUNTER — Ambulatory Visit: Admitting: Physician Assistant

## 2024-08-05 ENCOUNTER — Ambulatory Visit: Admitting: Physician Assistant

## 2024-08-13 ENCOUNTER — Ambulatory Visit (HOSPITAL_BASED_OUTPATIENT_CLINIC_OR_DEPARTMENT_OTHER): Admitting: Family Medicine

## 2024-09-01 ENCOUNTER — Ambulatory Visit (INDEPENDENT_AMBULATORY_CARE_PROVIDER_SITE_OTHER): Admitting: Family Medicine

## 2024-09-01 ENCOUNTER — Ambulatory Visit: Payer: Self-pay

## 2024-09-01 ENCOUNTER — Encounter (HOSPITAL_BASED_OUTPATIENT_CLINIC_OR_DEPARTMENT_OTHER): Payer: Self-pay | Admitting: Family Medicine

## 2024-09-01 VITALS — BP 122/70 | HR 75 | Temp 98.2°F | Ht 62.0 in | Wt 187.0 lb

## 2024-09-01 DIAGNOSIS — U071 COVID-19: Secondary | ICD-10-CM

## 2024-09-01 LAB — POC COVID19 BINAXNOW: SARS Coronavirus 2 Ag: POSITIVE — AB

## 2024-09-01 LAB — POCT INFLUENZA A/B
Influenza A, POC: NEGATIVE
Influenza B, POC: NEGATIVE

## 2024-09-01 NOTE — Telephone Encounter (Signed)
 FYI Only or Action Required?: FYI only for provider.  Patient was last seen in primary care on 07/14/2024 by de Peru, Quintin PARAS, MD.  Called Nurse Triage reporting Facial Pain.  Symptoms began 3 days ago.  Interventions attempted: OTC medications: Benadryl, Motrin  and Rest, hydration, or home remedies.  Symptoms are: gradually worsening.  Triage Disposition: See HCP Within 4 Hours (Or PCP Triage)  Patient/caregiver understands and will follow disposition?: Yes               Copied from CRM #[id number]. Topic: Clinical - Red Word Triage >> Sep 01, 2024  7:44 AM Rosaria BRAVO wrote: Red Word that prompted transfer to Nurse Triage: Some shortness of breath, fever/headache, possible sinus infection. Since Friday. Has felt weak. Difficulty breathing.    ----------------------------------------------------------------------- From previous Reason for Contact - Scheduling: Patient/patient representative is calling to schedule an appointment. Refer to attachments for appointment information. Reason for Disposition . [1] SEVERE sinus pain (e.g., excruciating) AND [2] not improved 2 hours after pain medicine  Answer Assessment - Initial Assessment Questions Friday--started feeling bad Took Benadryl and laid down 100.8 F temp Friday 98.5 F temp during triage Patient is advised to call us  back if anything changes or with any further questions/concerns. Patient is advised that if anything worsens to go to the Emergency Room. Patient verbalized understanding.   1. LOCATION: Where does it hurt?      Facial/sinus areas 2. ONSET: When did the sinus pain start?  (e.g., hours, days)      3 days ago 3. SEVERITY: How bad is the pain?   (Scale 0-10; or none, mild, moderate or severe)     ----- 4. RECURRENT SYMPTOM: Have you ever had sinus problems before? If Yes, ask: When was the last time? and What happened that time?      yes 5. NASAL CONGESTION: Is the nose blocked? If  Yes, ask: Can you open it or must you breathe through your mouth?     yes 6. NASAL DISCHARGE: Do you have discharge from your nose? If so ask, What color?     Slightly yellow 7. FEVER: Do you have a fever? If Yes, ask: What is it, how was it measured, and when did it start?      Off and 8. OTHER SYMPTOMS: Do you have any other symptoms? (e.g., sore throat, cough, earache, difficulty breathing)     Nasal discharge, coughing up a little mucous--white & slightly yellow  Protocols used: Sinus Pain or Congestion-A-AH

## 2024-09-01 NOTE — Telephone Encounter (Signed)
 Patient scheduled 10/13 with Angelica Miller

## 2024-09-01 NOTE — Patient Instructions (Signed)
 COVID-19 positive:  Stay home and away from others until symptoms are improving and fever free for 24 hours without the use of tylenol or ibuprofen. If symptoms have improved and fever resolved, then can be in public setting, but should wear a mask around others, practice safe distancing, and perform hand hygiene for an additional 5 days. If symptoms worsen, or the patient develop shortness of breath, pulse oximeter reading of < 90%, or chest pain, seek immediate care at nearest emergency department or call 911.   Vitamin Regimen:  Vitamin C 500mg  twice daily  Vitamin D 5000 units once daily  Zinc 50-75mg  once daily   Over the counter Medications:  Aspirin 81mg  per day * unless allergic or contraindicated*  Use Tylenol (acetaminophen) for fever *unless allergic or contraindicated*    Non-Medication Therapy:  Drink plenty of fluids, warm if possible.   A teaspoon of honey may help ease coughing symptoms.   Cough drops or hard candy for coughing.   Over the Counter Medication Therapy:  Use a cough expectorant such as guaifenesin (Mucinex) if recommended by your doctor for a wet, congested cough. If you have high blood pressure, please ask your doctor first before using this.   Use a cough suppressant such as dextromethorphan (Robitussin/Delsym) for a dry cough. If you have high blood pressure, please ask your doctor first before using this.   If you have high blood pressure, medication such as Coricidin HBP is safe to take for your cough and will not increase your blood pressure.
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
## 2024-09-02 ENCOUNTER — Telehealth (HOSPITAL_BASED_OUTPATIENT_CLINIC_OR_DEPARTMENT_OTHER): Payer: Self-pay | Admitting: *Deleted

## 2024-09-02 NOTE — Telephone Encounter (Signed)
 Routing to [place] for review. Please advise.

## 2024-09-02 NOTE — Telephone Encounter (Signed)
 Patient saw alexis please advise

## 2024-09-02 NOTE — Telephone Encounter (Signed)
 Copied from CRM #[id number]. Topic: General - Other >> Sep 02, 2024  2:46 PM Roselie BROCKS wrote: Reason for CRM: Patient needing to speak to clinic concerning a work note she received yesterday, It does not have that patient can return to work with no restrictions , and patient needs a updated note stating that information.

## 2024-09-03 ENCOUNTER — Encounter (HOSPITAL_BASED_OUTPATIENT_CLINIC_OR_DEPARTMENT_OTHER): Payer: Self-pay | Admitting: Family Medicine

## 2024-09-12 ENCOUNTER — Encounter (HOSPITAL_BASED_OUTPATIENT_CLINIC_OR_DEPARTMENT_OTHER): Payer: Self-pay | Admitting: Family Medicine

## 2024-09-12 ENCOUNTER — Ambulatory Visit (INDEPENDENT_AMBULATORY_CARE_PROVIDER_SITE_OTHER): Admitting: Family Medicine

## 2024-09-12 VITALS — BP 134/79 | HR 86 | Temp 98.3°F | Ht 62.0 in | Wt 183.0 lb

## 2024-09-12 DIAGNOSIS — J40 Bronchitis, not specified as acute or chronic: Secondary | ICD-10-CM

## 2024-09-12 MED ORDER — AZITHROMYCIN 250 MG PO TABS: ORAL_TABLET | ORAL | 0 refills | Status: AC

## 2024-09-12 MED ORDER — HYDROCODONE BIT-HOMATROP MBR 5-1.5 MG/5ML PO SOLN: 5.0000 mL | Freq: Three times a day (TID) | ORAL | 0 refills | Status: DC | PRN

## 2024-09-12 MED ORDER — PREDNISONE 10 MG (21) PO TBPK: ORAL_TABLET | ORAL | 0 refills | Status: DC

## 2024-09-12 MED ORDER — ALBUTEROL SULFATE HFA 108 (90 BASE) MCG/ACT IN AERS: 2.0000 | INHALATION_SPRAY | Freq: Four times a day (QID) | RESPIRATORY_TRACT | 2 refills | Status: AC | PRN
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
## 2024-09-12 NOTE — Patient Instructions (Signed)
 Start Mucinex  plain (not DM) for congestion.   Use albuterol  2-3 times per day for cough and shortness of breat/wheeezing.   Only use cough syrup at night due to drowsines. Do not combine with alcohol or sedating medications.

## 2024-09-14 ENCOUNTER — Encounter (HOSPITAL_BASED_OUTPATIENT_CLINIC_OR_DEPARTMENT_OTHER): Payer: Self-pay | Admitting: Family Medicine

## 2024-09-15 ENCOUNTER — Telehealth (HOSPITAL_BASED_OUTPATIENT_CLINIC_OR_DEPARTMENT_OTHER): Payer: Self-pay | Admitting: Family Medicine

## 2024-09-15 ENCOUNTER — Ambulatory Visit: Payer: Self-pay

## 2024-09-15 NOTE — Telephone Encounter (Signed)
 Patient is calling stating that she actually needs the work note that she talked about with pcp to excuse her from work over the weekend and longer if pcp wanted her out longer today?? she ended up getting sent home on saturday from work along with having elevated BP please advise

## 2024-09-15 NOTE — Telephone Encounter (Signed)
 Separate mychart was sent to pt

## 2024-09-15 NOTE — Telephone Encounter (Signed)
 Separate mychart was sent by pt about being sent home from work. Recommended to pt in that mychart to go to [facility] for further evaluation.

## 2024-09-15 NOTE — Telephone Encounter (Signed)
 Went back to work Saturday --saw nurse at work --was sent home --patient denies any changes in symptoms since she was seen by PCP on Friday 10/24 176/106  PULSE 70 TEMP 97.7 at 8:47am 167/98 at 9:09am  Patient states she needs a doctors Note to file FMLA for being off from work ---she states she needs a note saying excused from work Saturday  ---patient states her PCP was going to write her a doctor's note to be off over the weekend but patient thought she would feel okay to go to work but work sent her home  Patient states she will keep an euye on her blood pressure and she will talk to the office about paperwork and a possible appointment for blood pressure check   FYI Only or Action Required?: Action required by provider: request for documentation or forms.  Patient was last seen in primary care on 09/12/2024 by Knute Thersia Bitters, FNP.  Called Nurse Triage reporting Hypertension.   Triage Disposition: See PCP When Office is Open (Within 3 Days)  Patient/caregiver understands and will follow disposition?: No, wishes to speak with PCP             Copied from CRM #[id number]. Topic: Clinical - Red Word Triage >> Sep 15, 2024  8:27 AM [place] A wrote: [facility] that prompted transfer to Nurse Triage: Patient is having shortness of breath, blood pressure has been high and low and headaches. For week now Reason for Disposition  Systolic BP >= 160 OR Diastolic >= 100  Answer Assessment - Initial Assessment Questions Patient was seen by PCP 10/24 Went back to work Saturday --saw nurse at work --was sent home  Patient states nothing has changed since she saw her PCP on Friday 10/24  176/106  PULSE 70 TEMP 97.7 at 8:47am  Patient states she needs a doctors Note to file FMLA for being off from work ---she states she needs a note saying excused from work Saturday  ---patient states her PCP was going to write her a doctor's note to be off over the weekend but patient  thought she would feel okay to go to work but work sent her home  This RN called [facility] to inquire about paperwork-doctors note and [facility] advised that they messaged patient's PCP and they would call the patient back Patient states she will keep an euye on her blood pressure and she will talk to the office about paperwork and a possible appointment for blood pressure check   Patient is advised to call us  back if anything changes or with any further questions/concerns. Patient is advised that if anything worsens to go to the Emergency Room. Patient verbalized understanding.  Protocols used: Blood Pressure - High-A-AH

## 2024-09-18 ENCOUNTER — Telehealth (HOSPITAL_BASED_OUTPATIENT_CLINIC_OR_DEPARTMENT_OTHER): Payer: Self-pay | Admitting: Family Medicine

## 2024-09-18 NOTE — Telephone Encounter (Signed)
 Patient called in about her paperwork she brought by yesterday.  Please advise Thank you!

## 2024-09-18 NOTE — Telephone Encounter (Signed)
 Routing encounter to [place] for review.

## 2024-09-22 ENCOUNTER — Encounter: Payer: Self-pay | Admitting: Radiology

## 2024-09-22 DIAGNOSIS — Z0279 Encounter for issue of other medical certificate: Secondary | ICD-10-CM

## 2024-10-06 ENCOUNTER — Ambulatory Visit (HOSPITAL_BASED_OUTPATIENT_CLINIC_OR_DEPARTMENT_OTHER): Admitting: Family Medicine

## 2024-10-07 ENCOUNTER — Ambulatory Visit: Admitting: Physician Assistant

## 2024-10-23 ENCOUNTER — Ambulatory Visit

## 2024-10-23 VITALS — Ht 62.0 in | Wt 185.0 lb

## 2024-10-23 MED ORDER — NA SULFATE-K SULFATE-MG SULF 17.5-3.13-1.6 GM/177ML PO SOLN: 1.0000 | Freq: Once | ORAL | 0 refills | Status: AC

## 2024-10-23 NOTE — Progress Notes (Signed)

## 2024-10-29 ENCOUNTER — Encounter: Payer: Self-pay | Admitting: Gastroenterology

## 2024-11-05 ENCOUNTER — Telehealth: Payer: Self-pay | Admitting: Gastroenterology

## 2024-11-05 NOTE — Telephone Encounter (Signed)
 Good Afternoon Dr.Cirigliano,  Patient calling stating she is needing to cancel procedure due to having the flu. Patient stated she will call back to reschedule when she gets better.  Please advise  Thank you

## 2024-11-06 ENCOUNTER — Encounter: Admitting: Gastroenterology

## 2024-11-21 ENCOUNTER — Ambulatory Visit: Payer: Self-pay

## 2024-11-21 NOTE — Telephone Encounter (Signed)
 FYI Only or Action Required?: Action required by provider: clinical question for provider, update on patient condition, and requesting medication.  Patient was last seen in primary care on 09/12/2024 by Knute Thersia Bitters, FNP.  Called Nurse Triage reporting Cough.  Symptoms began several days ago.  Interventions attempted: Rest, hydration, or home remedies.  Symptoms are: unchanged.  Triage Disposition: See HCP Within 4 Hours (Or PCP Triage)  Patient/caregiver understands and will follow disposition?: No, wishes to speak with PCP  Copied from CRM #[id number]. Topic: Clinical - Red Word Triage >> Nov 21, 2024  9:18 AM Myrick T wrote: Red Word that prompted transfer to Nurse Triage: patient called stated she has been having a bronchitis flare up since Sunday with coughing and wheezing in her chest. Patient is requesting a zpak. Reason for Disposition  Wheezing is present  Answer Assessment - Initial Assessment Questions Patient with hx of bronchitis reports coughing and wheezing since Sunday. Patient is requesting medications for her symptoms-z pac and cough medication. Requesting a call back from the office.   1. ONSET: When did the cough begin?      Started on Sunday 2. SEVERITY: How bad is the cough today?      Moderate to severe 3. SPUTUM: Describe the color of your sputum (e.g., none, dry cough; clear, white, yellow, green)     dry 4. HEMOPTYSIS: Are you coughing up any blood? If Yes, ask: How much? (e.g., flecks, streaks, tablespoons, etc.)     no 5. DIFFICULTY BREATHING: Are you having difficulty breathing? If Yes, ask: How bad is it? (e.g., mild, moderate, severe)      moderate 6. FEVER: Do you have a fever? If Yes, ask: What is your temperature, how was it measured, and when did it start?     no 7. CARDIAC HISTORY: Do you have any history of heart disease? (e.g., heart attack, congestive heart failure)      no 8. LUNG HISTORY: Do you have any  history of lung disease?  (e.g., pulmonary embolus, asthma, emphysema)     yes 9. PE RISK FACTORS: Do you have a history of blood clots? (or: recent major surgery, recent prolonged travel, bedridden)     no 10. OTHER SYMPTOMS: Do you have any other symptoms? (e.g., runny nose, wheezing, chest pain)       yes 11. TRAVEL: Have you traveled out of the country in the last month? (e.g., travel history, exposures)       no  Protocols used: Cough - Acute Non-Productive-A-AH

## 2024-11-22 DIAGNOSIS — R0602 Shortness of breath: Secondary | ICD-10-CM | POA: Insufficient documentation

## 2024-11-24 ENCOUNTER — Telehealth (HOSPITAL_BASED_OUTPATIENT_CLINIC_OR_DEPARTMENT_OTHER): Payer: Self-pay | Admitting: Family Medicine

## 2024-11-24 NOTE — Telephone Encounter (Signed)
 Sent pt a mychart.

## 2024-11-24 NOTE — Telephone Encounter (Signed)
 Copied from CRM [id number]. Topic: General - Other >> Nov 21, 2024  1:24 PM Angelica Miller wrote: Reason for CRM: Patient is calling in because she contacted the office about coughing, and she hasn't heard anything back. Please advise.

## 2024-11-25 ENCOUNTER — Other Ambulatory Visit (HOSPITAL_BASED_OUTPATIENT_CLINIC_OR_DEPARTMENT_OTHER): Payer: Self-pay

## 2024-11-25 ENCOUNTER — Ambulatory Visit (INDEPENDENT_AMBULATORY_CARE_PROVIDER_SITE_OTHER): Admitting: Family Medicine

## 2024-11-25 ENCOUNTER — Encounter (HOSPITAL_BASED_OUTPATIENT_CLINIC_OR_DEPARTMENT_OTHER): Payer: Self-pay | Admitting: Family Medicine

## 2024-11-25 VITALS — BP 120/65 | HR 72 | Temp 98.0°F | Resp 18 | Ht 62.0 in | Wt 189.0 lb

## 2024-11-25 DIAGNOSIS — R052 Subacute cough: Secondary | ICD-10-CM

## 2024-11-25 MED ORDER — AZITHROMYCIN 250 MG PO TABS: ORAL_TABLET | ORAL | 0 refills | Status: AC

## 2024-11-25 MED ORDER — PREDNISONE 20 MG PO TABS: ORAL_TABLET | ORAL | 0 refills | Status: AC

## 2024-11-25 MED ORDER — HYDROCODONE BIT-HOMATROP MBR 5-1.5 MG/5ML PO SOLN: 5.0000 mL | Freq: Three times a day (TID) | ORAL | 0 refills | Status: AC | PRN

## 2024-11-25 NOTE — Assessment & Plan Note (Signed)
 Cough with chest congestion. - Prescribed hydrocodone  cough syrup. - Prescribed short course of steroids - Provided work return note with no restrictions. - Provided pulmonary office contact for appointment scheduling.

## 2024-11-25 NOTE — Progress Notes (Signed)
" ° ° °  Procedures performed today:    None.  Independent interpretation of notes and tests performed by another provider:   None.  Brief History, Exam, Impression, and Recommendations:    BP 120/65 (BP Location: Left Arm, Patient Position: Sitting, Cuff Size: Normal)   Pulse 72   Temp 98 F (36.7 C) (Oral)   Resp 18   Ht 5' 2 (1.575 m)   Wt 189 lb (85.7 kg)   SpO2 100%   BMI 34.57 kg/m   Discussed the use of AI scribe software for clinical note transcription with the patient, who gave verbal consent to proceed.  History of Present Illness Angelica Miller is a 59 year old female with recurrent bronchitis who presents with persistent cough and respiratory symptoms.  She has been experiencing a persistent cough for the past two weeks, which began after returning from a holiday trip where there was a change in climate. The cough is severe enough that she delayed unpacking her car for two days due to feeling unwell. She reports hearing herself wheezing and having chest congestion with phlegm production.  She reports significant sweating but no fever. She has been cautious to avoid contact with sick individuals.  She has a history of bronchitis and recalls previous treatments including hydrocodone  cough syrup, a Z-Pak, Zofran , steroids, and an inhaler. During a previous flare-up, she was out of work for several weeks. Currently, she is using the remaining hydrocodone  cough syrup and reports some improvement, as she can now talk without coughing excessively.  She has not yet followed up with a pulmonary specialist despite a referral being placed previously. She mentions missing a colonoscopy appointment scheduled for December 18th due to illness and plans to reschedule it. She also notes that her mammogram is due.  On exam, patient is in no acute distress, vital signs stable. Cardiovascular exam with regular rate and rhythm, lungs clear to auscultation bilaterally.  Subacute  cough Assessment & Plan: Cough with chest congestion. - Prescribed hydrocodone  cough syrup. - Prescribed short course of steroids - Provided work return note with no restrictions. - Provided pulmonary office contact for appointment scheduling.   Return if symptoms worsen or fail to improve.   ___________________________________________ Bradon Fester de [place], MD, ABFM, [facility] "

## 2024-12-02 ENCOUNTER — Other Ambulatory Visit: Payer: Self-pay | Admitting: Family Medicine

## 2024-12-02 ENCOUNTER — Encounter: Payer: Self-pay | Admitting: Gastroenterology

## 2024-12-02 DIAGNOSIS — Z1231 Encounter for screening mammogram for malignant neoplasm of breast: Secondary | ICD-10-CM

## 2024-12-17 ENCOUNTER — Ambulatory Visit

## 2024-12-18 ENCOUNTER — Ambulatory Visit: Admitting: Primary Care

## 2024-12-18 ENCOUNTER — Ambulatory Visit

## 2024-12-26 ENCOUNTER — Ambulatory Visit

## 2025-01-06 ENCOUNTER — Ambulatory Visit: Admitting: Gastroenterology

## 2025-01-19 ENCOUNTER — Ambulatory Visit: Admitting: Primary Care
